# Patient Record
Sex: Male | Born: 1940 | Race: White
Health system: Southern US, Community
[De-identification: ages and names within clinical notes are randomized; demographics above are authoritative.]

## PROBLEM LIST (undated history)

## (undated) DIAGNOSIS — M797 Fibromyalgia: Secondary | ICD-10-CM

## (undated) DIAGNOSIS — K219 Gastro-esophageal reflux disease without esophagitis: Secondary | ICD-10-CM

## (undated) DIAGNOSIS — M799 Soft tissue disorder, unspecified: Secondary | ICD-10-CM

## (undated) DIAGNOSIS — M4800 Spinal stenosis, site unspecified: Secondary | ICD-10-CM

## (undated) DIAGNOSIS — R55 Syncope and collapse: Secondary | ICD-10-CM

## (undated) DIAGNOSIS — K921 Melena: Secondary | ICD-10-CM

## (undated) DIAGNOSIS — M4310 Spondylolisthesis, site unspecified: Secondary | ICD-10-CM

## (undated) DIAGNOSIS — I10 Essential (primary) hypertension: Secondary | ICD-10-CM

## (undated) HISTORY — PX: BACK SURGERY: SHX140

## (undated) HISTORY — DX: Fibromyalgia: M79.7

## (undated) HISTORY — DX: Spinal stenosis, site unspecified: M48.00

## (undated) HISTORY — DX: Gastro-esophageal reflux disease without esophagitis: K21.9

## (undated) HISTORY — DX: Essential (primary) hypertension: I10

## (undated) HISTORY — DX: Spondylolisthesis, site unspecified: M43.10

## (undated) HISTORY — PX: TONSILLECTOMY: SUR1361

## (undated) HISTORY — DX: Soft tissue disorder, unspecified: M79.9

## (undated) HISTORY — DX: Syncope and collapse: R55

## (undated) HISTORY — PX: VASECTOMY: SHX75

## (undated) HISTORY — DX: Melena: K92.1

---

## 1993-12-10 DIAGNOSIS — E785 Hyperlipidemia, unspecified: Secondary | ICD-10-CM

## 1993-12-10 HISTORY — DX: Hyperlipidemia, unspecified: E78.5

## 1997-12-10 DIAGNOSIS — N429 Disorder of prostate, unspecified: Secondary | ICD-10-CM

## 1997-12-10 HISTORY — DX: Disorder of prostate, unspecified: N42.9

## 1998-12-10 DIAGNOSIS — F419 Anxiety disorder, unspecified: Secondary | ICD-10-CM

## 1998-12-10 HISTORY — DX: Anxiety disorder, unspecified: F41.9

## 2005-12-10 DIAGNOSIS — Z8719 Personal history of other diseases of the digestive system: Secondary | ICD-10-CM

## 2005-12-10 DIAGNOSIS — N400 Enlarged prostate without lower urinary tract symptoms: Secondary | ICD-10-CM

## 2005-12-10 HISTORY — DX: Benign prostatic hyperplasia without lower urinary tract symptoms: N40.0

## 2005-12-10 HISTORY — DX: Personal history of other diseases of the digestive system: Z87.19

## 2006-04-25 DIAGNOSIS — K861 Other chronic pancreatitis: Secondary | ICD-10-CM | POA: Insufficient documentation

## 2006-05-23 DIAGNOSIS — I10 Essential (primary) hypertension: Secondary | ICD-10-CM | POA: Insufficient documentation

## 2006-06-25 ENCOUNTER — Other Ambulatory Visit: Payer: Self-pay

## 2006-07-02 ENCOUNTER — Ambulatory Visit: Payer: Self-pay | Admitting: Specialist

## 2012-12-10 DIAGNOSIS — K859 Acute pancreatitis without necrosis or infection, unspecified: Secondary | ICD-10-CM

## 2012-12-10 HISTORY — DX: Acute pancreatitis without necrosis or infection, unspecified: K85.90

## 2013-06-03 DIAGNOSIS — M545 Low back pain, unspecified: Secondary | ICD-10-CM | POA: Insufficient documentation

## 2013-06-03 DIAGNOSIS — M47817 Spondylosis without myelopathy or radiculopathy, lumbosacral region: Secondary | ICD-10-CM | POA: Insufficient documentation

## 2013-06-03 DIAGNOSIS — S32000A Wedge compression fracture of unspecified lumbar vertebra, initial encounter for closed fracture: Secondary | ICD-10-CM | POA: Insufficient documentation

## 2014-03-23 DIAGNOSIS — M533 Sacrococcygeal disorders, not elsewhere classified: Secondary | ICD-10-CM | POA: Insufficient documentation

## 2014-12-22 DIAGNOSIS — M25561 Pain in right knee: Secondary | ICD-10-CM | POA: Insufficient documentation

## 2015-01-13 DIAGNOSIS — M25551 Pain in right hip: Secondary | ICD-10-CM | POA: Insufficient documentation

## 2015-03-30 DIAGNOSIS — M47816 Spondylosis without myelopathy or radiculopathy, lumbar region: Secondary | ICD-10-CM | POA: Insufficient documentation

## 2015-08-25 DIAGNOSIS — M5416 Radiculopathy, lumbar region: Secondary | ICD-10-CM | POA: Insufficient documentation

## 2015-09-30 ENCOUNTER — Other Ambulatory Visit: Payer: Self-pay | Admitting: Orthopaedic Surgery

## 2015-10-17 ENCOUNTER — Other Ambulatory Visit: Payer: Self-pay | Admitting: Orthopaedic Surgery

## 2015-10-17 DIAGNOSIS — M48061 Spinal stenosis, lumbar region without neurogenic claudication: Secondary | ICD-10-CM

## 2015-10-17 DIAGNOSIS — M4316 Spondylolisthesis, lumbar region: Secondary | ICD-10-CM

## 2015-10-19 ENCOUNTER — Ambulatory Visit: Payer: Medicare Other

## 2015-10-19 NOTE — Pre-Procedure Instructions (Addendum)
   Pre-op Labs, EKG, CXR & Med Eval will be done 10/20/15 at 4 pm w/ PMD. Pt will ask PMD to fax all records to [facility].   Pt lives in [place] and will be flying to the area on 10/24/15 night to stay w/ his daughter prior to surgery.   Will come to [facility] 10/25/15 for T&S 2 units and MRSA/MSSA n/t swabs - will bring order from surgeon. Orders for T&S 2 units, MRSA/MSSA n/t & Vit D already in epic   Pt was evaluated for a 1x syncopal episode in approx 2012 - pt states EKG, Echo & Carotid Dopplers done - states all tests were "normal" - after hrs, office closed - fax requested for LOV notes, EKG and any testing from [facility].   Pt states pain is managed by surgeon.   Pt states he will bring his MRI disc on dos.   Educated pt about Comfort Function Goal - understanding verbalized.   Pt states he will watch the "Back in Action" video & take quiz - no questions at this time.

## 2015-10-24 NOTE — Pre-Procedure Instructions (Signed)
   Reviewed ancef/TED order

## 2015-10-24 NOTE — Discharge Instructions (Addendum)
Back in Action Spine Surgery booklet to be provided and reviewed -    Spine Wound Care information sheet to be provided and reviewed -    Constipation Information sheet to be provided and reviewed   -    Mixing alcohol and pain medications can cause dizziness and slow your breathing.    Don't drink alcoholic beverages while taking pain medications.           Edman Circle. Marjo Bicker, MD  Orthopaedic Spine Surgery    Lumbar Fusion Surgery  Discharge Instructions    WOUND CARE:    Your wound will be closed with absorbable stitches and covered with steri-strips, gauze and tape. Change the gauze dressing daily for seven days after surgery. If you notice any swelling, redness, or excessive drainage, call our office at [phone]. You may shower on the 5th day after surgery. Use caution to avoid letting the water pound on your incision. Do not soak your incision in the bath tub, hot tub, or pool until 4 weeks after surgery. You may purchase dressing supplies at [employer] located in the west corridor of the [employer].    ACTIVITY:    While in the hospital, you will be seen by occupational and physical therapists who will help you to sit in a chair and start walking and learn to climb stairs. After two to three days, you will be discharged to go home. You are encouraged to walk and change positions frequently. Some patients may need to have a physical therapist come to their home for a few visits. Some patients may need a walker and/or a bedside commode for a short time after arriving home. Do not bend, twist, or lift anything greater than a gallon of milk until re-evaluation. Do not use anti-inflammatory medications for two months after surgery. You may drive when you feel comfortable and are no longer using narcotic medications. Your doctor will discuss with you when you will be able to return to work.    FOLLOW UP:    You will be given a post-op appointment at two weeks after surgery at which time X-rays will be  taken. You will start physical therapy at 3 months after surgery. Should you have further questions or concerns at any point during your recovery, please do not hesitate to contact our office at [phone].    [address]  [address]  [place], [place]    [address]  Phone: [phone]  [phone]        Morphine Sulfate Oral tablet, extended-release    What is this medicine?  MORPHINE (MOR feen) is a pain reliever. It is used to treat moderate to severe pain that lasts for more than a few days.  This medicine may be used for other purposes; ask your health care provider or pharmacist if you have questions.  What should I tell my health care provider before I take this medicine?  They need to know if you have any of these conditions:   brain tumor   drug abuse or addiction   gallbladder disease   head injury   heart disease   if you frequently drink alcohol-containing drinks   intestinal disease   kidney disease or problems urinating   kyphoscoliosis   liver disease   lung disease, asthma, or breathing problems   pancreatic disease   seizures   stomach or intestine problems   taken isocarboxazid, phenelzine, tranylcypromine, or selegiline in the past 2 weeks   thyroid disease  an unusual or allergic reaction to lactose, morphine, other medicines, foods, dyes, or preservatives   pregnant or trying to get pregnant   breast-feeding  How should I use this medicine?  Take this medicine by mouth with a glass of water. Do not break, crush, or chew the medicine. Do not take a tablet that is not whole. A broken or crushed tablet can be very dangerous. You may get too much medicine. If the medicine upsets your stomach, take it with food or milk. Follow the directions on the prescription label. Take the medicine at the same time each day. Do not take more medicine than you are told to take.  A special MedGuide will be given to you by the pharmacist with each prescription and refill. Be sure to read  this information carefully each time.  Talk to your pediatrician regarding the use of this medicine in children. Special care may be needed.  Overdosage: If you think you have taken too much of this medicine contact a poison control center or emergency room at once.  NOTE: This medicine is only for you. Do not share this medicine with others.  What if I miss a dose?  If you miss a dose, take it as soon as you can. If it is almost time for your next dose, take only that dose. Do not take double or extra doses.  What may interact with this medicine?  Do not take this medicine with any of the following medications:   MAOIs like Carbex, Eldepryl, Marplan, Nardil, and Parnate  This medicine may also interact with the following medications:   alcohol   antihistamines   barbiturates, like phenobarbital   medicines for depression, anxiety, or psychotic disturbances   medicines for sleep   muscle relaxants   naltrexone, naloxone   narcotic medicines (opiates) for pain   rifampin   tramadol  This list may not describe all possible interactions. Give your health care provider a list of all the medicines, herbs, non-prescription drugs, or dietary supplements you use. Also tell them if you smoke, drink alcohol, or use illegal drugs. Some items may interact with your medicine.  What should I watch for while using this medicine?  Tell your doctor or health care professional if your pain does not go away, if it gets worse, or if you have new or a different type of pain. You may develop tolerance to the medicine. Tolerance means that you will need a higher dose of the medicine for pain relief. Tolerance is normal and is expected if you take this medicine for a long time.  Do not suddenly stop taking your medicine because you may develop a severe reaction. Your body becomes used to the medicine. This does NOT mean you are addicted. Addiction is a behavior related to getting and using a drug for a non-medical reason. If you  have pain, you have a medical reason to take pain medicine. Your doctor will tell you how much medicine to take. If your doctor wants you to stop the medicine, the dose will be slowly lowered over time to avoid any side effects.  You may get drowsy or dizzy when you first start taking the medicine or change doses. Do not drive, use machinery, or do anything that may be dangerous until you know how the medicine affects you. Stand or sit up slowly.  There are different types of narcotic medicines (opiates) for pain. If you take more than one type at the same time,  you may have more side effects. Give your health care provider a list of all medicines you use. Your doctor will tell you how much medicine to take. Do not take more medicine than directed. Call emergency for help if you have problems breathing.  This medicine will cause constipation. Try to have a bowel movement at least every 2 to 3 days. If you do not have a bowel movement for 3 days, call your doctor or health care professional.  Your mouth may get dry. Drinking water, chewing sugarless gum, or sucking on hard candy may help. See your dentist every 6 months.  What side effects may I notice from receiving this medicine?  Side effects that you should report to your doctor or health care professional as soon as possible:   allergic reactions like skin rash, itching or hives, swelling of the face, lips, or tongue   breathing problems   change in the amount of urine   confusion   fast, irregular heartbeat   feeling faint or lightheaded   fever, chills   hallucinations   seizures   slow or fast heartbeat   unusually weak or tired  Side effects that usually do not require medical attention (report to your doctor or health care professional if they continue or are bothersome):   constipation   dizziness   headache   nausea, vomiting   pinpoint pupils   sweating  This list may not describe all possible side effects. Call your doctor for medical  advice about side effects. You may report side effects to FDA at 1-800-FDA-1088.  Where should I keep my medicine?  Keep out of the reach of children. This medicine can be abused. Keep your medicine in a safe place to protect it from theft. Do not share this medicine with anyone. Selling or giving away this medicine is dangerous and is against the law.  Store at room temperature between 15 and 30 degrees C (59 and 86 degrees F). Protect from light.  This medicine may cause accidental overdose and death if it is taken by other adults, children, or pets. Flush any unused medicine down the toilet to reduce the chance of harm. Do not use the medicine after the expiration date.  NOTE: This sheet is a summary. It may not cover all possible information. If you have questions about this medicine, talk to your doctor, pharmacist, or health care provider.  NOTE:This sheet is a summary. It may not cover all possible information. If you have questions about this medicine, talk to your doctor, pharmacist, or health care provider. Copyright 2015 Gold Standard      Cyclobenzaprine Hydrochloride Oral tablet- Flexeril    What is this medicine?  CYCLOBENZAPRINE (sye kloe BEN za preen) is a muscle relaxer. It is used to treat muscle pain, spasms, and stiffness.  This medicine may be used for other purposes; ask your health care provider or pharmacist if you have questions.  What should I tell my health care provider before I take this medicine?  They need to know if you have any of these conditions:   heart disease, irregular heartbeat, or previous heart attack   liver disease   thyroid problem   an unusual or allergic reaction to cyclobenzaprine, tricyclic antidepressants, lactose, other medicines, foods, dyes, or preservatives   pregnant or trying to get pregnant   breast-feeding  How should I use this medicine?  Take this medicine by mouth with a glass of water. Follow the directions on  the prescription label. If this  medicine upsets your stomach, take it with food or milk. Take your medicine at regular intervals. Do not take it more often than directed.  Talk to your pediatrician regarding the use of this medicine in children. Special care may be needed.  Overdosage: If you think you have taken too much of this medicine contact a poison control center or emergency room at once.  NOTE: This medicine is only for you. Do not share this medicine with others.  What if I miss a dose?  If you miss a dose, take it as soon as you can. If it is almost time for your next dose, take only that dose. Do not take double or extra doses.  What may interact with this medicine?  Do not take this medicine with any of the following medications:   certain medicines for fungal infections like fluconazole, itraconazole, ketoconazole, posaconazole, voriconazole   cisapride   dofetilide   dronedarone   droperidol   flecainide   grepafloxacin   halofantrine   levomethadyl   MAOIs like Carbex, Eldepryl, Marplan, Nardil, and Parnate   nilotinib   pimozide   probucol   sertindole   thioridazine   ziprasidone  This medicine may also interact with the following medications:   abarelix   alcohol   certain medicines for cancer   certain medicines for depression, anxiety, or psychotic disturbances   certain medicines for infection like alfuzosin, chloroquine, clarithromycin, levofloxacin, mefloquine, pentamidine, troleandomycin   certain medicines for an irregular heart beat   certain medicines used for sleep or numbness during surgery or procedure   contrast dyes   dolasetron   guanethidine   methadone   octreotide   ondansetron   other medicines that prolong the QT interval (cause an abnormal heart rhythm)   palonosetron   phenothiazines like chlorpromazine, mesoridazine, prochlorperazine, thioridazine   tramadol   vardenafil  This list may not describe all possible interactions. Give your health care provider a list of all the  medicines, herbs, non-prescription drugs, or dietary supplements you use. Also tell them if you smoke, drink alcohol, or use illegal drugs. Some items may interact with your medicine.  What should I watch for while using this medicine?  Check with your doctor or health care professional if your condition does not improve within 1 to 3 weeks.  You may get drowsy or dizzy when you first start taking the medicine or change doses. Do not drive, use machinery, or do anything that may be dangerous until you know how the medicine affects you. Stand or sit up slowly.  Your mouth may get dry. Drinking water, chewing sugarless gum, or sucking on hard candy may help.  What side effects may I notice from receiving this medicine?  Side effects that you should report to your doctor or health care professional as soon as possible:   allergic reactions like skin rash, itching or hives, swelling of the face, lips, or tongue   chest pain   fast heartbeat   hallucinations   seizures   vomiting  Side effects that usually do not require medical attention (report to your doctor or health care professional if they continue or are bothersome):   headache  This list may not describe all possible side effects. Call your doctor for medical advice about side effects. You may report side effects to FDA at 1-800-FDA-1088.  Where should I keep my medicine?  Keep out of the reach of children.  Store at room temperature between 15 and 30 degrees C (59 and 86 degrees F). Keep container tightly closed. Throw away any unused medicine after the expiration date.  NOTE:This sheet is a summary. It may not cover all possible information. If you have questions about this medicine, talk to your doctor, pharmacist, or health care provider. Copyright 2015 Gold Standard      Pantoprazole Sodium Gastro-resistant tablet- Protonix     What is this medicine?  PANTOPRAZOLE (pan TOE pra zole) prevents the production of acid in the stomach. It is used to treat  gastroesophageal reflux disease (GERD), inflammation of the esophagus, and Zollinger-Ellison syndrome.  This medicine may be used for other purposes; ask your health care provider or pharmacist if you have questions.  What should I tell my health care provider before I take this medicine?  They need to know if you have any of these conditions:   liver disease   low levels of magnesium in the blood   an unusual or allergic reaction to omeprazole, lansoprazole, pantoprazole, rabeprazole, other medicines, foods, dyes, or preservatives   pregnant or trying to get pregnant   breast-feeding  How should I use this medicine?  Take this medicine by mouth. Swallow the tablets whole with a drink of water. Follow the directions on the prescription label. Do not crush, break, or chew. Take your medicine at regular intervals. Do not take your medicine more often than directed.  Talk to your pediatrician regarding the use of this medicine in children. While this drug may be prescribed for children as young as 5 years for selected conditions, precautions do apply.  Overdosage: If you think you have taken too much of this medicine contact a poison control center or emergency room at once.  NOTE: This medicine is only for you. Do not share this medicine with others.  What if I miss a dose?  If you miss a dose, take it as soon as you can. If it is almost time for your next dose, take only that dose. Do not take double or extra doses.  What may interact with this medicine?  Do not take this medicine with any of the following medications:   atazanavir   nelfinavir  This medicine may also interact with the following medications:   ampicillin   delavirdine   digoxin   diuretics   iron salts   medicines for fungal infections like ketoconazole, itraconazole and voriconazole   warfarin  This list may not describe all possible interactions. Give your health care provider a list of all the medicines, herbs, non-prescription  drugs, or dietary supplements you use. Also tell them if you smoke, drink alcohol, or use illegal drugs. Some items may interact with your medicine.  What should I watch for while using this medicine?  It can take several days before your stomach pain gets better. Check with your doctor or health care professional if your condition does not start to get better, or if it gets worse.  You may need blood work done while you are taking this medicine.  What side effects may I notice from receiving this medicine?  Side effects that you should report to your doctor or health care professional as soon as possible:   allergic reactions like skin rash, itching or hives, swelling of the face, lips, or tongue   bone, muscle or joint pain   breathing problems   chest pain or chest tightness   dark yellow or brown urine  dizziness   fast, irregular heartbeat   feeling faint or lightheaded   fever or sore throat   muscle spasm   palpitations   redness, blistering, peeling or loosening of the skin, including inside the mouth   seizures   tremors   unusual bleeding or bruising   unusually weak or tired   yellowing of the eyes or skin  Side effects that usually do not require medical attention (Report these to your doctor or health care professional if they continue or are bothersome.):   constipation   diarrhea   dry mouth   headache   nausea  This list may not describe all possible side effects. Call your doctor for medical advice about side effects. You may report side effects to FDA at 1-800-FDA-1088.  Where should I keep my medicine?  Keep out of the reach of children.  Store at room temperature between 15 and 30 degrees C (59 and 86 degrees F). Protect from light and moisture. Throw away any unused medicine after the expiration date.  NOTE:This sheet is a summary. It may not cover all possible information. If you have questions about this medicine, talk to your doctor, pharmacist, or health care provider.  Copyright 2015 Gold Standard        Dextromethorphan Hydriodide, Guaifenesin Oral tablet, extended-release    What is this medicine?  DEXTROMETHORPHAN; GUAIFENESIN (dex troe meth OR fan; gwye FEN e sin) is a cough suppressant, expectorant combination. It is used to provide relief from cough. This medicine will not treat an infection.  This medicine may be used for other purposes; ask your health care provider or pharmacist if you have questions.  What should I tell my health care provider before I take this medicine?  They need to know if you have any of these conditions:   asthma   chronic bronchitis   emphysema   if you have taken an MAOI like Carbex, Eldepryl, Marplan, Nardil, or Parnate in last 14 days   kidney or liver disease   unable to sit up   an unusual or allergic reaction to dextromethorphan, guaifenesin, other medicines, foods, dyes, bromides, or preservatives   pregnant or trying to get pregnant   breast-feeding  How should I use this medicine?  Take this medicine by mouth with a full glass of water. Follow the directions on the prescription label. Do not crush or chew. Take your medicine at regular intervals. Do not take your medicine more often than directed.  Talk to your pediatrician regarding the use of this medicine in children. While this drug may be prescribed for children as young as 13 years of age for selected conditions, precautions do apply.  Overdosage: If you think you have taken too much of this medicine contact a poison control center or emergency room at once.  NOTE: This medicine is only for you. Do not share this medicine with others.  What if I miss a dose?  If you miss a dose, take it as soon as you can. If it is almost time for your next dose, take only that dose. Do not take double or extra doses.  What may interact with this medicine?  Do not take this medicine with any of the following medications:   MAOIs like Carbex, Eldepryl, Marplan, Nardil, and  Parnate   procarbazine  This medicine may also interact with the following medications:   medicines for depression or other mental disturbances   other medicines for colds or allergy  This list may not describe all possible interactions. Give your health care provider a list of all the medicines, herbs, non-prescription drugs, or dietary supplements you use. Also tell them if you smoke, drink alcohol, or use illegal drugs. Some items may interact with your medicine.  What should I watch for while using this medicine?  Tell your doctor if your symptoms do not improve within 5 days or if they get worse. If you have a high fever, skin rash, lasting headache, or sore throat, see your doctor.  Drink 6 to 8 glasses of water daily while you are taking this medicine to help loosen mucus.  You may get drowsy or dizzy. Do not drive, use machinery, or do anything that needs mental alertness until you know how this medicine affects you. Do not stand or sit up quickly, especially if you are an older patient. This reduces the risk of dizzy or fainting spells. Alcohol may interfere with the effect of this medicine. Avoid alcoholic drinks.  What side effects may I notice from receiving this medicine?  Side effects that you should report to your doctor or health care professional as soon as possible:   allergic reactions like skin rash, itching or hives, swelling of the face, lips, or tongue   confusion   excitement, nervousness, restlessness, or irritability   slow or troubled breathing  Side effects that usually do not require medical attention (report to your doctor or health care professional if they continue or are bothersome):   headache   stomach upset  This list may not describe all possible side effects. Call your doctor for medical advice about side effects. You may report side effects to FDA at 1-800-FDA-1088.  Where should I keep my medicine?  Keep out of the reach of children.  Store at room temperature between  15 and 30 degrees C (59 and 86 degrees F). Protect from light and moisture. Throw away any unused medicine after the expiration date.  NOTE:This sheet is a summary. It may not cover all possible information. If you have questions about this medicine, talk to your doctor, pharmacist, or health care provider. Copyright 2015 Gold Standard

## 2015-10-24 NOTE — Plan of Care (Signed)
Problem: Health Promotion  Goal: Risk control - tobacco abuse  Actions to eliminate or reduce tobacco use.  Outcome: Completed Date Met:  10/24/15  Never smoked

## 2015-10-24 NOTE — Pre-Procedure Instructions (Signed)
   Call to pcp, spoke with Neysa Bonito, she will fax lov note, ekg, labs, to [facility] [phone].

## 2015-10-25 ENCOUNTER — Ambulatory Visit: Payer: Medicare Other | Attending: Orthopaedic Surgery

## 2015-10-25 DIAGNOSIS — M48061 Spinal stenosis, lumbar region without neurogenic claudication: Secondary | ICD-10-CM

## 2015-10-25 DIAGNOSIS — Z01812 Encounter for preprocedural laboratory examination: Secondary | ICD-10-CM | POA: Insufficient documentation

## 2015-10-25 DIAGNOSIS — Z0183 Encounter for blood typing: Secondary | ICD-10-CM | POA: Insufficient documentation

## 2015-10-25 DIAGNOSIS — M4316 Spondylolisthesis, lumbar region: Secondary | ICD-10-CM | POA: Insufficient documentation

## 2015-10-25 DIAGNOSIS — M4806 Spinal stenosis, lumbar region: Secondary | ICD-10-CM | POA: Insufficient documentation

## 2015-10-25 LAB — HEMOLYSIS INDEX: Hemolysis Index: 16 (ref 0–18)

## 2015-10-25 LAB — TYPE AND SCREEN
AB Screen Gel: NEGATIVE
ABO Rh: O POS

## 2015-10-25 LAB — VITAMIN D,25 OH,TOTAL: Vitamin D, 25 OH, Total: 16 ng/mL — ABNORMAL LOW (ref 30–100)

## 2015-10-26 NOTE — Pre-Procedure Instructions (Signed)
   Called PMD's office to request for EKG  be faxed from their office - for better copy.

## 2015-10-26 NOTE — Pre-Procedure Instructions (Signed)
T&X on chart - MRSA/MSSA pending

## 2015-10-27 NOTE — Pre-Procedure Instructions (Signed)
Reviewed negative MRSA/MSSA

## 2015-11-10 ENCOUNTER — Inpatient Hospital Stay
Admission: RE | Admit: 2015-11-10 | Discharge: 2015-11-18 | DRG: 460 | Disposition: A | Discharge status: Rehabilitation Facility | Payer: Medicare Other | Source: Ambulatory Visit | Attending: Hospitalist | Admitting: Hospitalist

## 2015-11-10 ENCOUNTER — Inpatient Hospital Stay: Payer: Medicare Other | Admitting: Orthopaedic Surgery

## 2015-11-10 ENCOUNTER — Inpatient Hospital Stay: Payer: Medicare Other

## 2015-11-10 ENCOUNTER — Encounter
Admission: RE | Disposition: A | Discharge status: Rehabilitation Facility | Payer: Self-pay | Source: Ambulatory Visit | Attending: Orthopaedic Surgery

## 2015-11-10 DIAGNOSIS — M48061 Spinal stenosis, lumbar region without neurogenic claudication: Secondary | ICD-10-CM | POA: Insufficient documentation

## 2015-11-10 DIAGNOSIS — M4316 Spondylolisthesis, lumbar region: Secondary | ICD-10-CM | POA: Diagnosis present

## 2015-11-10 DIAGNOSIS — N401 Enlarged prostate with lower urinary tract symptoms: Secondary | ICD-10-CM

## 2015-11-10 DIAGNOSIS — K567 Ileus, unspecified: Secondary | ICD-10-CM | POA: Diagnosis not present

## 2015-11-10 DIAGNOSIS — M5136 Other intervertebral disc degeneration, lumbar region: Secondary | ICD-10-CM | POA: Diagnosis present

## 2015-11-10 DIAGNOSIS — K219 Gastro-esophageal reflux disease without esophagitis: Secondary | ICD-10-CM | POA: Diagnosis present

## 2015-11-10 DIAGNOSIS — M797 Fibromyalgia: Secondary | ICD-10-CM | POA: Diagnosis present

## 2015-11-10 DIAGNOSIS — M25561 Pain in right knee: Secondary | ICD-10-CM | POA: Diagnosis not present

## 2015-11-10 DIAGNOSIS — R079 Chest pain, unspecified: Secondary | ICD-10-CM | POA: Diagnosis not present

## 2015-11-10 DIAGNOSIS — R410 Disorientation, unspecified: Secondary | ICD-10-CM | POA: Diagnosis not present

## 2015-11-10 DIAGNOSIS — M4806 Spinal stenosis, lumbar region: Principal | ICD-10-CM | POA: Diagnosis present

## 2015-11-10 DIAGNOSIS — M5137 Other intervertebral disc degeneration, lumbosacral region: Secondary | ICD-10-CM | POA: Diagnosis present

## 2015-11-10 DIAGNOSIS — I10 Essential (primary) hypertension: Secondary | ICD-10-CM | POA: Diagnosis present

## 2015-11-10 DIAGNOSIS — Z981 Arthrodesis status: Secondary | ICD-10-CM

## 2015-11-10 DIAGNOSIS — D72829 Elevated white blood cell count, unspecified: Secondary | ICD-10-CM | POA: Diagnosis not present

## 2015-11-10 DIAGNOSIS — R509 Fever, unspecified: Secondary | ICD-10-CM | POA: Diagnosis not present

## 2015-11-10 DIAGNOSIS — R319 Hematuria, unspecified: Secondary | ICD-10-CM | POA: Diagnosis not present

## 2015-11-10 DIAGNOSIS — K9189 Other postprocedural complications and disorders of digestive system: Secondary | ICD-10-CM | POA: Diagnosis not present

## 2015-11-10 DIAGNOSIS — N400 Enlarged prostate without lower urinary tract symptoms: Secondary | ICD-10-CM | POA: Diagnosis present

## 2015-11-10 DIAGNOSIS — M542 Cervicalgia: Secondary | ICD-10-CM | POA: Diagnosis present

## 2015-11-10 HISTORY — DX: Spinal stenosis, lumbar region without neurogenic claudication: M48.061

## 2015-11-10 LAB — TYPE AND SCREEN
AB Screen Gel: NEGATIVE
ABO Rh: O POS

## 2015-11-10 LAB — ECG 12-LEAD
Atrial Rate: 85 {beats}/min
P Axis: 48 degrees
P-R Interval: 160 ms
Q-T Interval: 358 ms
QRS Duration: 88 ms
QTC Calculation (Bezet): 426 ms
R Axis: -19 degrees
T Axis: -9 degrees
Ventricular Rate: 85 {beats}/min

## 2015-11-10 SURGERY — LAMINECTOMY, POSTERIOR LUMBAR, DECOMP, FUSION, LEVEL 2
Anesthesia: Anesthesia General | Site: Back | Wound class: Clean

## 2015-11-10 MED ORDER — OXYCODONE-ACETAMINOPHEN 5-325 MG PO TABS
2.0000 | ORAL_TABLET | ORAL | Status: DC | PRN
Start: 2015-11-10 — End: 2015-11-11
  Administered 2015-11-11: 2 via ORAL
  Filled 2015-11-10 (×2): qty 2

## 2015-11-10 MED ORDER — CEFAZOLIN SODIUM 1 G IJ SOLR
1.0000 g | INTRAMUSCULAR | Status: AC
Start: 2015-11-10 — End: 2015-11-10
  Administered 2015-11-10: 1 g via INTRAVENOUS

## 2015-11-10 MED ORDER — FAMOTIDINE 10 MG/ML IV SOLN (WRAP)
INTRAVENOUS | Status: DC | PRN
Start: 2015-11-10 — End: 2015-11-10
  Administered 2015-11-10: 20 mg via INTRAVENOUS

## 2015-11-10 MED ORDER — CEFAZOLIN 1 GM MBP (CNR)
Status: AC
Start: 2015-11-10 — End: ?
  Filled 2015-11-10: qty 100

## 2015-11-10 MED ORDER — FAMOTIDINE 20 MG/2ML IV SOLN
INTRAVENOUS | Status: AC
Start: 2015-11-10 — End: ?
  Filled 2015-11-10: qty 2

## 2015-11-10 MED ORDER — ROCURONIUM BROMIDE 50 MG/5ML IV SOLN
INTRAVENOUS | Status: DC | PRN
Start: 2015-11-10 — End: 2015-11-10
  Administered 2015-11-10: 50 mg via INTRAVENOUS
  Administered 2015-11-10: 10 mg via INTRAVENOUS

## 2015-11-10 MED ORDER — PREGABALIN 75 MG PO CAPS
75.0000 mg | ORAL_CAPSULE | Freq: Once | ORAL | Status: AC
Start: 2015-11-10 — End: 2015-11-10
  Administered 2015-11-10: 75 mg via ORAL

## 2015-11-10 MED ORDER — SODIUM CHLORIDE BACTERIOSTATIC 0.9 % IJ SOLN
INTRAMUSCULAR | Status: DC | PRN
Start: 2015-11-10 — End: 2015-11-10
  Administered 2015-11-10: 10 mL

## 2015-11-10 MED ORDER — CELECOXIB 200 MG PO CAPS
200.0000 mg | ORAL_CAPSULE | Freq: Once | ORAL | Status: AC
Start: 2015-11-10 — End: 2015-11-10
  Administered 2015-11-10: 200 mg via ORAL

## 2015-11-10 MED ORDER — MICROFIBRILLAR COLL HEMOSTAT EX POWD
CUTANEOUS | Status: DC | PRN
Start: 2015-11-10 — End: 2015-11-10
  Administered 2015-11-10: 1 g via TOPICAL

## 2015-11-10 MED ORDER — OXYCODONE-ACETAMINOPHEN 5-325 MG PO TABS
1.0000 | ORAL_TABLET | ORAL | Status: DC | PRN
Start: 2015-11-10 — End: 2015-11-11

## 2015-11-10 MED ORDER — OXYCODONE HCL ER 10 MG PO T12A
10.0000 mg | EXTENDED_RELEASE_TABLET | Freq: Once | ORAL | Status: AC
Start: 2015-11-10 — End: 2015-11-10
  Administered 2015-11-10: 10 mg via ORAL

## 2015-11-10 MED ORDER — AMLODIPINE BESYLATE 5 MG PO TABS
ORAL_TABLET | Freq: Every day | ORAL | Status: DC
Start: 2015-11-11 — End: 2015-11-18
  Filled 2015-11-10 (×15): qty 1

## 2015-11-10 MED ORDER — CEFAZOLIN SODIUM 1 G IJ SOLR
1.0000 g | Freq: Three times a day (TID) | INTRAMUSCULAR | Status: AC
Start: 2015-11-10 — End: 2015-11-11
  Administered 2015-11-10 – 2015-11-11 (×2): 1 g via INTRAVENOUS
  Filled 2015-11-10 (×2): qty 1000

## 2015-11-10 MED ORDER — HYDROMORPHONE HCL 1 MG/ML IJ SOLN
INTRAMUSCULAR | Status: AC
Start: 2015-11-10 — End: ?
  Filled 2015-11-10: qty 1

## 2015-11-10 MED ORDER — HYDROMORPHONE HCL 1 MG/ML IJ SOLN
0.5000 mg | INTRAMUSCULAR | Status: AC | PRN
Start: 2015-11-10 — End: 2015-11-10
  Administered 2015-11-10 (×2): 0.5 mg via INTRAVENOUS

## 2015-11-10 MED ORDER — LACTATED RINGERS IV SOLN
INTRAVENOUS | Status: DC
Start: 2015-11-10 — End: 2015-11-14

## 2015-11-10 MED ORDER — HEPARIN SODIUM (PORCINE) 1000 UNIT/ML IJ SOLN
INTRAMUSCULAR | Status: DC | PRN
Start: 2015-11-10 — End: 2015-11-10
  Administered 2015-11-10: 6000 [IU]

## 2015-11-10 MED ORDER — PROMETHAZINE HCL 25 MG PO TABS
25.0000 mg | ORAL_TABLET | Freq: Four times a day (QID) | ORAL | Status: DC | PRN
Start: 2015-11-10 — End: 2015-11-18
  Administered 2015-11-12: 25 mg via ORAL
  Filled 2015-11-10: qty 1

## 2015-11-10 MED ORDER — PROPOFOL 10 MG/ML IV EMUL (WRAP)
INTRAVENOUS | Status: AC
Start: 2015-11-10 — End: ?
  Filled 2015-11-10: qty 20

## 2015-11-10 MED ORDER — BENZOCAINE-MENTHOL 15-3.6 MG MT LOZG
1.0000 | LOZENGE | OROMUCOSAL | Status: DC | PRN
Start: 2015-11-10 — End: 2015-11-18

## 2015-11-10 MED ORDER — FENTANYL CITRATE (PF) 50 MCG/ML IJ SOLN (WRAP)
INTRAMUSCULAR | Status: DC | PRN
Start: 2015-11-10 — End: 2015-11-10
  Administered 2015-11-10: 100 ug via INTRAVENOUS
  Administered 2015-11-10: 50 ug via INTRAVENOUS

## 2015-11-10 MED ORDER — PROMETHAZINE HCL 25 MG/ML IJ SOLN
6.5000 mg | Freq: Four times a day (QID) | INTRAMUSCULAR | Status: DC | PRN
Start: 2015-11-10 — End: 2015-11-18
  Administered 2015-11-12: 6.5 mg via INTRAVENOUS
  Filled 2015-11-10: qty 1

## 2015-11-10 MED ORDER — OXYCODONE HCL ER 10 MG PO T12A
EXTENDED_RELEASE_TABLET | ORAL | Status: AC
Start: 2015-11-10 — End: ?
  Filled 2015-11-10: qty 1

## 2015-11-10 MED ORDER — EPHEDRINE SULFATE 50 MG/ML IJ SOLN
INTRAMUSCULAR | Status: AC
Start: 2015-11-10 — End: ?
  Filled 2015-11-10: qty 1

## 2015-11-10 MED ORDER — ACETAMINOPHEN 500 MG PO TABS
1000.0000 mg | ORAL_TABLET | Freq: Once | ORAL | Status: DC | PRN
Start: 2015-11-10 — End: 2015-11-10

## 2015-11-10 MED ORDER — HYDROMORPHONE HCL 1 MG/ML IJ SOLN
INTRAMUSCULAR | Status: AC
Start: 2015-11-10 — End: 2015-11-10
  Administered 2015-11-10: 0.5 mg via INTRAVENOUS
  Filled 2015-11-10: qty 1

## 2015-11-10 MED ORDER — HYDROMORPHONE HCL 2 MG PO TABS
2.0000 mg | ORAL_TABLET | Freq: Once | ORAL | Status: AC | PRN
Start: 2015-11-10 — End: 2015-11-10

## 2015-11-10 MED ORDER — MEPERIDINE HCL 25 MG/ML IJ SOLN
12.5000 mg | Freq: Once | INTRAMUSCULAR | Status: DC
Start: 2015-11-10 — End: 2015-11-10

## 2015-11-10 MED ORDER — PHENYLEPHRINE HCL 10 MG/ML IV SOLN (WRAP)
100.0000 mg | Status: DC | PRN
Start: 2015-11-10 — End: 2015-11-10
  Administered 2015-11-10: 30 ug/min via INTRAVENOUS

## 2015-11-10 MED ORDER — ACETAMINOPHEN 500 MG PO TABS
1000.0000 mg | ORAL_TABLET | Freq: Once | ORAL | Status: AC
Start: 2015-11-10 — End: 2015-11-10
  Administered 2015-11-10: 1000 mg via ORAL

## 2015-11-10 MED ORDER — ACETAMINOPHEN 500 MG PO TABS
ORAL_TABLET | ORAL | Status: AC
Start: 2015-11-10 — End: ?
  Filled 2015-11-10: qty 2

## 2015-11-10 MED ORDER — PHENYLEPHRINE 100 MCG/ML IN NACL 0.9% IV SOSY
PREFILLED_SYRINGE | INTRAVENOUS | Status: AC
Start: 2015-11-10 — End: ?
  Filled 2015-11-10: qty 5

## 2015-11-10 MED ORDER — LIDOCAINE HCL 2 % IJ SOLN
INTRAMUSCULAR | Status: DC | PRN
Start: 2015-11-10 — End: 2015-11-10
  Administered 2015-11-10: 100 mg via INTRAVENOUS

## 2015-11-10 MED ORDER — LIDOCAINE HCL (PF) 2 % IJ SOLN
INTRAMUSCULAR | Status: AC
Start: 2015-11-10 — End: ?
  Filled 2015-11-10: qty 5

## 2015-11-10 MED ORDER — EPHEDRINE SULFATE 50 MG/ML IJ SOLN
INTRAMUSCULAR | Status: DC | PRN
Start: 2015-11-10 — End: 2015-11-10
  Administered 2015-11-10 (×2): 5 mg via INTRAVENOUS

## 2015-11-10 MED ORDER — BISACODYL 10 MG RE SUPP
10.0000 mg | Freq: Every day | RECTAL | Status: DC | PRN
Start: 2015-11-10 — End: 2015-11-18
  Administered 2015-11-15: 10 mg via RECTAL
  Filled 2015-11-10: qty 1

## 2015-11-10 MED ORDER — HYDROMORPHONE HCL 1 MG/ML IJ SOLN
INTRAMUSCULAR | Status: DC | PRN
Start: 2015-11-10 — End: 2015-11-10
  Administered 2015-11-10: .3 mg via INTRAVENOUS
  Administered 2015-11-10: .2 mg via INTRAVENOUS

## 2015-11-10 MED ORDER — HYDROMORPHONE HCL 1 MG/ML IJ SOLN
1.0000 mg | INTRAMUSCULAR | Status: DC | PRN
Start: 2015-11-10 — End: 2015-11-17
  Administered 2015-11-10 – 2015-11-16 (×10): 1 mg via INTRAVENOUS
  Filled 2015-11-10 (×10): qty 1

## 2015-11-10 MED ORDER — SODIUM CHLORIDE 0.9 % IR SOLN
Status: DC | PRN
Start: 2015-11-10 — End: 2015-11-10
  Administered 2015-11-10 (×2): 1000 mL
  Administered 2015-11-10: 250 mL
  Administered 2015-11-10: 1000 mL

## 2015-11-10 MED ORDER — FENTANYL CITRATE (PF) 50 MCG/ML IJ SOLN (WRAP)
25.0000 ug | INTRAMUSCULAR | Status: AC | PRN
Start: 2015-11-10 — End: 2015-11-10
  Administered 2015-11-10 (×3): 25 ug via INTRAVENOUS

## 2015-11-10 MED ORDER — PREGABALIN 75 MG PO CAPS
ORAL_CAPSULE | ORAL | Status: AC
Start: 2015-11-10 — End: ?
  Filled 2015-11-10: qty 1

## 2015-11-10 MED ORDER — PRAMIPEXOLE DIHYDROCHLORIDE 0.5 MG PO TABS
0.2500 mg | ORAL_TABLET | Freq: Every evening | ORAL | Status: DC
Start: 2015-11-10 — End: 2015-11-18
  Administered 2015-11-10 – 2015-11-17 (×8): 0.25 mg via ORAL
  Filled 2015-11-10 (×9): qty 1

## 2015-11-10 MED ORDER — PHENYLEPHRINE 100 MCG/ML IN NACL 0.9% IV SOSY
PREFILLED_SYRINGE | INTRAVENOUS | Status: DC | PRN
Start: 2015-11-10 — End: 2015-11-10
  Administered 2015-11-10: 200 ug via INTRAVENOUS
  Administered 2015-11-10 (×8): 100 ug via INTRAVENOUS

## 2015-11-10 MED ORDER — ATORVASTATIN CALCIUM 10 MG PO TABS
10.0000 mg | ORAL_TABLET | Freq: Every evening | ORAL | Status: DC
Start: 2015-11-10 — End: 2015-11-18
  Administered 2015-11-10 – 2015-11-17 (×7): 10 mg via ORAL
  Filled 2015-11-10 (×8): qty 1

## 2015-11-10 MED ORDER — ONDANSETRON HCL 4 MG/2ML IJ SOLN
4.0000 mg | Freq: Once | INTRAMUSCULAR | Status: DC | PRN
Start: 2015-11-10 — End: 2015-11-10

## 2015-11-10 MED ORDER — PROMETHAZINE HCL 25 MG/ML IJ SOLN
6.2500 mg | Freq: Once | INTRAMUSCULAR | Status: DC | PRN
Start: 2015-11-10 — End: 2015-11-10

## 2015-11-10 MED ORDER — ROCURONIUM BROMIDE 50 MG/5ML IV SOLN
INTRAVENOUS | Status: AC
Start: 2015-11-10 — End: ?
  Filled 2015-11-10: qty 5

## 2015-11-10 MED ORDER — PROPOFOL 10 MG/ML IV EMUL (WRAP)
INTRAVENOUS | Status: DC | PRN
Start: 2015-11-10 — End: 2015-11-10
  Administered 2015-11-10: 50 mg via INTRAVENOUS
  Administered 2015-11-10: 150 mg via INTRAVENOUS

## 2015-11-10 MED ORDER — FLEET ENEMA 7-19 GM/118ML RE ENEM
1.0000 | ENEMA | Freq: Once | RECTAL | Status: DC | PRN
Start: 2015-11-10 — End: 2015-11-18

## 2015-11-10 MED ORDER — DEXAMETHASONE SODIUM PHOSPHATE 4 MG/ML IJ SOLN (WRAP)
INTRAMUSCULAR | Status: DC | PRN
Start: 2015-11-10 — End: 2015-11-10
  Administered 2015-11-10: 10 mg via INTRAVENOUS

## 2015-11-10 MED ORDER — GLYCOPYRROLATE 0.2 MG/ML IJ SOLN
INTRAMUSCULAR | Status: DC | PRN
Start: 2015-11-10 — End: 2015-11-10
  Administered 2015-11-10: .2 mg via INTRAVENOUS
  Administered 2015-11-10: .4 mg via INTRAVENOUS

## 2015-11-10 MED ORDER — FENTANYL CITRATE (PF) 50 MCG/ML IJ SOLN (WRAP)
INTRAMUSCULAR | Status: AC
Start: 2015-11-10 — End: ?
  Filled 2015-11-10: qty 4

## 2015-11-10 MED ORDER — SODIUM CHLORIDE 0.9 % IV SOLN
INTRAVENOUS | Status: DC
Start: 2015-11-10 — End: 2015-11-14

## 2015-11-10 MED ORDER — EPINEPHRINE HCL 1 MG/ML IJ SOLN (WRAP)
Status: DC | PRN
Start: 2015-11-10 — End: 2015-11-10
  Administered 2015-11-10: 17:00:00 1 mg

## 2015-11-10 MED ORDER — THROMBIN 5000 UNITS EX SOLR
CUTANEOUS | Status: DC | PRN
Start: 2015-11-10 — End: 2015-11-10
  Administered 2015-11-10: 5000 [IU] via TOPICAL
  Administered 2015-11-10: 10000 [IU] via TOPICAL

## 2015-11-10 MED ORDER — DIPHENHYDRAMINE HCL 50 MG/ML IJ SOLN
12.5000 mg | INTRAMUSCULAR | Status: DC | PRN
Start: 2015-11-10 — End: 2015-11-10

## 2015-11-10 MED ORDER — CELECOXIB 200 MG PO CAPS
ORAL_CAPSULE | ORAL | Status: AC
Start: 2015-11-10 — End: ?
  Filled 2015-11-10: qty 1

## 2015-11-10 MED ORDER — FENTANYL CITRATE (PF) 50 MCG/ML IJ SOLN (WRAP)
INTRAMUSCULAR | Status: AC
Start: 2015-11-10 — End: 2015-11-10
  Administered 2015-11-10: 25 ug via INTRAVENOUS
  Filled 2015-11-10: qty 2

## 2015-11-10 MED ORDER — HYDROMORPHONE HCL 2 MG PO TABS
ORAL_TABLET | ORAL | Status: AC
Start: 2015-11-10 — End: 2015-11-10
  Administered 2015-11-10: 2 mg via ORAL
  Filled 2015-11-10: qty 1

## 2015-11-10 MED ORDER — DIPHENHYDRAMINE HCL 25 MG PO CAPS
25.0000 mg | ORAL_CAPSULE | Freq: Four times a day (QID) | ORAL | Status: DC | PRN
Start: 2015-11-10 — End: 2015-11-18
  Administered 2015-11-11: 25 mg via ORAL
  Filled 2015-11-10 (×2): qty 1

## 2015-11-10 MED ORDER — GELATIN ABSORBABLE 100 EX MISC
CUTANEOUS | Status: DC | PRN
Start: 2015-11-10 — End: 2015-11-10
  Administered 2015-11-10: 1 via TOPICAL

## 2015-11-10 MED ORDER — OXYCODONE-ACETAMINOPHEN 5-325 MG PO TABS
1.0000 | ORAL_TABLET | Freq: Once | ORAL | Status: DC | PRN
Start: 2015-11-10 — End: 2015-11-11

## 2015-11-10 MED ORDER — BACITRACIN 50000 UNITS IM SOLR
INTRAMUSCULAR | Status: DC | PRN
Start: 2015-11-10 — End: 2015-11-10
  Administered 2015-11-10 (×2): 50000 [IU]

## 2015-11-10 MED ORDER — DEXAMETHASONE SODIUM PHOSPHATE 20 MG/5ML IJ SOLN
INTRAMUSCULAR | Status: AC
Start: 2015-11-10 — End: ?
  Filled 2015-11-10: qty 5

## 2015-11-10 MED ORDER — NEOSTIGMINE METHYLSULFATE 1 MG/ML IJ SOLN
INTRAMUSCULAR | Status: DC | PRN
Start: 2015-11-10 — End: 2015-11-10
  Administered 2015-11-10: 2 mg via INTRAVENOUS
  Administered 2015-11-10: 1 mg via INTRAVENOUS

## 2015-11-10 MED ORDER — CYCLOBENZAPRINE HCL 10 MG PO TABS
5.0000 mg | ORAL_TABLET | Freq: Three times a day (TID) | ORAL | Status: DC | PRN
Start: 2015-11-10 — End: 2015-11-18
  Administered 2015-11-12 – 2015-11-17 (×8): 5 mg via ORAL
  Filled 2015-11-10 (×8): qty 1

## 2015-11-10 MED ORDER — ONDANSETRON HCL 4 MG/2ML IJ SOLN
INTRAMUSCULAR | Status: DC | PRN
Start: 2015-11-10 — End: 2015-11-10
  Administered 2015-11-10: 4 mg via INTRAVENOUS

## 2015-11-10 MED ORDER — SENNOSIDES-DOCUSATE SODIUM 8.6-50 MG PO TABS
1.0000 | ORAL_TABLET | Freq: Two times a day (BID) | ORAL | Status: DC
Start: 2015-11-10 — End: 2015-11-18
  Administered 2015-11-10 – 2015-11-18 (×14): 1 via ORAL
  Filled 2015-11-10 (×12): qty 1

## 2015-11-10 MED ORDER — ONDANSETRON 4 MG PO TBDP
4.0000 mg | ORAL_TABLET | Freq: Three times a day (TID) | ORAL | Status: DC | PRN
Start: 2015-11-10 — End: 2015-11-18
  Administered 2015-11-11 – 2015-11-15 (×8): 4 mg via ORAL
  Filled 2015-11-10 (×8): qty 1

## 2015-11-10 MED ORDER — ONDANSETRON HCL 4 MG/2ML IJ SOLN
4.0000 mg | Freq: Three times a day (TID) | INTRAMUSCULAR | Status: DC | PRN
Start: 2015-11-10 — End: 2015-11-18

## 2015-11-10 MED ORDER — PROMETHAZINE HCL 25 MG RE SUPP
25.0000 mg | Freq: Four times a day (QID) | RECTAL | Status: DC | PRN
Start: 2015-11-10 — End: 2015-11-18

## 2015-11-10 MED ORDER — MAGNESIUM HYDROXIDE 400 MG/5ML PO SUSP
10.0000 mL | ORAL | Status: DC | PRN
Start: 2015-11-10 — End: 2015-11-18

## 2015-11-10 SURGICAL SUPPLY — 101 items
ANGIO CATH CODM (Procedure Accessories) ×2 IMPLANT
BAG DECANTER STERILE (Procedure Accessories) ×2 IMPLANT
BAG FOLEY DRAIN 16FR (Catheter Micellaneous) ×1
BAG FOLEY DRAIN 16FR (Catheter Miscellaneous) ×1 IMPLANT
BANDAGE ADH PLSTR POLYACRYLATE CVRL 2YD (Bandage) ×1
BANDAGE ADHESIVE L2 YD X W6 IN STRETCH (Bandage) ×1
BANDAGE ADHESIVE L2 YD X W6 IN STRETCH NONWOVEN POROUS COVER-ROLL (Bandage) ×1 IMPLANT
BONE MASTERGRAFT VIAL 5CC (Bone) ×2 IMPLANT
CATH IV 14GX1.75IN NLTX (IV Supply) ×2 IMPLANT
CLOSURE STERI-STRIP 1X5IN (Dressing) ×2 IMPLANT
COTTONOID 1/2X1" 80-1402" (Dressing) IMPLANT
COVER HNDL LIGHT SINGLES (Procedure Accessories) ×4 IMPLANT
COVER HVYDTY BLK 65X90IN (Drape) ×2 IMPLANT
COVER STAND W23 IN REINFORCE PLASTIC (Drape) ×1 IMPLANT
COVER STND PLS MAYO CNVRT 23IN LF STRL (Drape) ×2
CTTND 1/2X1/2" (Sponge) IMPLANT
DRAIN INCS SIL FULL FLUT FLT 3/8IN LF (Drain) ×1
DRAIN RADIOPAQUE 4 FREE FLOW CHANNEL (Drain) ×1
DRAIN RADIOPAQUE 4 FREE FLOW CHANNEL BARD L3/8 IN INCISION SILICONE (Drain) ×1 IMPLANT
DRAPE 3/4 SHEET FANFLD 52X76IN (Drape) ×2 IMPLANT
DRAPE LAPAROTOMY (Drape) ×2 IMPLANT
DRAPE MAG FM DVN 20X16IN LF STRL HNDFR (Drape) ×1
DRAPE MAGNETIC HAND FREE TRANSFER (Drape) ×1 IMPLANT
EVACUATOR RELIAVAC 100M (Drain) ×2 IMPLANT
FORCEPS ELECTROSURGICAL L7 3/4 IN 1.5 MM BAYONET BIPOLAR INSULATE CORD (Disposable Instruments) ×1 IMPLANT
FORCEPS ESURG SS 1.5MM BYNT LBRTY S-G (Disposable Instruments) ×2
GAUZE COVER ROLL STRETCH 6 (Dressing) ×2 IMPLANT
GLOVE SRG DERMASHIELD NPRN 7 GAMMEX LF (Glove) ×2
GLOVE SRG NTR RBR 8 INDCTR BGL 299X103MM (Glove) ×2
GLOVE SRGCL 7 GMMX CHEMOTHERAPY PWDR FREE ANTISLIP SMTH MICRO NPRN GRN (Glove) ×2 IMPLANT
GLOVE SURGICAL 7 GAMMEX CHEMOTHERAPY (Glove) ×2
GLOVE SURGICAL 8 INDICATOR BIOGEL POWDER (Glove) ×2
GLOVE SURGICAL 8 INDICATOR BIOGEL POWDER FREE SMOOTH BEAD CUFF (Glove) ×2 IMPLANT
GOWN OPTIMA STRL BACK OR (Gown) ×2 IMPLANT
GRAFT BN HA TCP CLGN 10ML 100X25X4MM (Allograft) ×2 IMPLANT
GRAFT BONE 100X25X4MM HA TCP COLLAGEN 10ML ALLOGRAFT STRIP GRANULE (Allograft) ×1 IMPLANT
GRAFT BONE RHBMP-2 BOVINE COLLAGEN L23 (Bone) ×1 IMPLANT
GRAFT BONE RHBMP-2 BOVINE COLLAGEN L23 MM 2.8 ML ABSORBABLE SPONGE (Bone) ×1 IMPLANT
INFUSE BN GRAFT SMALL KIT (Bone) ×1 IMPLANT
KIT MAROMAX CONVENIENCE DISP (Kits) ×2 IMPLANT
MARKER SKIN 2IN1 W/T-O SLEEVE (Procedure Accessories) ×2 IMPLANT
MASTISOL VIAL 2/3CC STRL (Skin Closure) ×2 IMPLANT
NEEDLE NOKOR FILTER 19GX1.5IN (Needles) ×2 IMPLANT
NEEDLE SPINAL BD OD22 GA L3 1/2 IN (Needles)
NEEDLE SPINAL L3 1/2 IN REGULAR WALL QUINCKE TIP OD22 GA BD (Needles) IMPLANT
NEEDLE SPNL PP RW BD QNCK 22GA 3.5IN LF (Needles)
PACK SPNE FFX (Pack) ×2 IMPLANT
PAD ABD PVC CRTY 9X5IN LF STRL 3 LYR (Dressing) ×2 IMPLANT
PAD ARMBOARD 20X8X2IN (Procedure Accessories) ×6 IMPLANT
PAD ELECTROSRG GRND REM W CRD (Procedure Accessories) ×2 IMPLANT
PATTIES 1X3 USE LAWSON 2442 (Sponge) ×2 IMPLANT
POSITIONER HEAD SLOTTED ADLT (Positioning Supplies) ×2 IMPLANT
ROD SPINAL DENALI L65 MM OD5.5 MM (Rods) ×2 IMPLANT
ROD SPINAL DENALI L65 MM OD5.5 MM TITANIUM CONTOUR GRAY (Rods) ×2 IMPLANT
ROD SPNL TI CNTR DENALI 5.5MM 65MM NS (Rods) ×2 IMPLANT
SCREW BN VRST 6.5MM 45MM NS PA SPNE (Screw) ×4 IMPLANT
SCREW BN VRST 7.5MM 40MM NS PA SPNE (Screw) ×2 IMPLANT
SCREW L40 MM OD7.5 MM SPINE POLYAXIAL (Screw) ×2 IMPLANT
SCREW L40 MM OD7.5 MM SPINE POLYAXIAL BONE EVEREST (Screw) ×2 IMPLANT
SCREW L45 MM OD6.5 MM SPINE POLYAXIAL (Screw) ×4 IMPLANT
SCREW L45 MM OD6.5 MM SPINE POLYAXIAL BONE EVEREST (Screw) ×4 IMPLANT
SCREW SET VRST NS SPNE (Screw) ×6 IMPLANT
SET SCREW LUMBAR ONE LEVEL EVEREST (Screw) ×6 IMPLANT
SLEEVE TED SCD LARGE (Procedure Accessories) ×2 IMPLANT
SOLUTION SRGPRP 74% ISPRP 0.7% IOD (Prep) ×1
SOLUTION SURGICAL PREP 26 ML DURAPREP (Prep) ×1
SOLUTION SURGICAL PREP 26 ML DURAPREP 74% ISOPROPYL ALCOHOL 0.7% (Prep) ×1 IMPLANT
SPONGE GAUZE 12PLY STRL 4X4IN (Sponge) ×2 IMPLANT
SPONGE GAUZE L4 IN X W4 IN 16 PLY (Dressing) ×1
SPONGE GAUZE L4 IN X W4 IN 16 PLY MAXIMUM ABSORBENT USP TYPE VII (Dressing) ×1 IMPLANT
SPONGE GZE CTTN CRTY 4X4IN LF NS 16 PLY (Dressing) ×1
SPONGE LAP 18X18IN PREWASH WHT (Sponge)
SPONGE LAPAROTOMY L18 IN X W18 IN (Sponge)
SPONGE LAPAROTOMY L18 IN X W18 IN PREWASH WHITE (Sponge) IMPLANT
SPONGE PEANUT C5 HOLDER DISSECTOR XRAY (Sponge)
SPONGE PEANUT C5 HOLDER DISSECTOR XRAY DETECTABLE OD3/8 IN DUKAL (Sponge) IMPLANT
SPONGE PEANUT RADPQE STRL3/8IN (Sponge)
SPONGE SRG VISTEC 8X4IN LF STRL 12 PLY (Sponge)
SPONGE SURGICAL L8 IN X W4 IN 12 PLY (Sponge)
SPONGE SURGICAL L8 IN X W4 IN 12 PLY RADIOPAQUE BAND VISTEC BLUE WHITE (Sponge) IMPLANT
STRIP SKIN CLOSURE L4 IN X W1/4 IN (Dressing) ×1
STRIP SKIN CLOSURE L4 IN X W1/4 IN REINFORCE STERI-STRIP POLYESTER (Dressing) ×1 IMPLANT
STRIP SKNCLS PLSTR STRSTRP 4X.25IN LF (Dressing) ×1
SUBSTITUTE BONE GRAFT CALCIUM PHOSPHATE COLLAGEN 5 CC VIAL VOID FILLER (Bone) ×1 IMPLANT
SUTURE ABS 1 CT1 VCL 18IN CR BRD 8 STRN (Suture) ×2
SUTURE ABS 2-0 CT1 VCL 18IN CR BRD 8 (Suture) ×2
SUTURE ABS 3-0 PS1 VCL MTPS 27IN BRD (Suture) ×1
SUTURE COATED VICRYL 1 CT-1 L18 IN (Suture) ×2 IMPLANT
SUTURE COATED VICRYL 2-0 CT-1 18IN CNTRL BRD 8 STRND UNDYED (Suture) ×2 IMPLANT
SUTURE COATED VICRYL 2-0 CT-1 L18 IN (Suture) ×2
SUTURE COATED VICRYL 3-0 PS-1 L27 IN (Suture) ×1 IMPLANT
SYRINGE LUER LOCK 10CC (Syringes, Needles) ×8 IMPLANT
SYSTEM IRR MTL VTVU YNKR 12FR LF STRL (Suction) ×2
SYSTEM IRRIGATION EXTEND SUCTION ILLUMINATION TUBE BULB TIP VITAL-VUE (Suction) ×1 IMPLANT
TOOL DISSECTING L14 CM MATCH HEAD FLUTE (Burr) ×1
TOOL DISSECTING L14 CM MATCH HEAD FLUTE OD3 MM MIDAS REX LEGEND (Burr) ×1 IMPLANT
TOOL DSCT LGND 3MM 14MM (Burr) ×1
TOWEL L26 IN X W17 IN COTTON PREWASH DELINT BLUE ACTISORB DELUXE (Procedure Accessories) IMPLANT
TOWEL SRG CTTN 26X17IN LF STRL PREWASH (Procedure Accessories)
TOWEL STERILE REUSABLE 8PK (Procedure Accessories) ×2 IMPLANT
TUBING SUCTION 3/16X10 (Tubing) ×4 IMPLANT

## 2015-11-10 NOTE — Consults (Signed)
[facility] INTERNAL MEDICINE CONSULT    Date Time: 11/10/2015 9:27 PM  Patient Name: Matthew [name]  Attending Physician: Clelia Schaumann, MD  Primary Care Physician: Bobby Rumpf, MD    CC: chest pain before lumbar laminectomy and fusion      History of Presenting Illness:   Matthew [name] is a 74 y.o. male hx chronic pancreatitis, BPH, HTN who had chest pain before lumbar laminectomy and fusion 12/1 by Dr. Marjo Bicker. He had an EKG during the episode that showed no ST elevations or LBBB. He proceeded with surgery. He states the pain was sharp, left lower chest / left upper quadrant, and lasted ~2 minutes. He denies chest pain, shortness of breath, but notes acid reflux. These symptoms are sudden onset, moderate intensity, without alleviating factors.     He states having been on Dilaudid 4mg  PO q4h prn pain before the surgery.    Past Medical History:     Past Medical History   Diagnosis Date   . Hypertension      controlled   . Syncopal episodes approx 2012     1x episode - had negative cardiac work-up - no episodes sinice   . BPH (benign prostatic hypertrophy) 2007     w/ hx of obstruction - s/p suprapubic prostatectomy   . History of pancreatitis 2007     had microscopic stones - ERCP w/ stent done   . Spinal stenosis    . Spondylolisthesis    . Fibromyalgia      in remission   . Gastroesophageal reflux disease    . 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 Ercp, stent  2007   . Suprapubic prostatectomy  2007   . Cholecystectomy  1980   . Vasectomy  1980's   . Cervical fusion  1997     states has full ROM of neck   . Appendectomy  1952   . Tonsillectomy  as child       Family History:   History reviewed. No pertinent family history.  His mother and father had cancer.     Social History:     Social History     Social History   . Marital Status: Divorced     Spouse  Name: N/A   . Number of Children: N/A   . Years of Education: N/A     Social History Main Topics   . Smoking status: Never Smoker    . Smokeless tobacco: Never Used   . Alcohol Use: Yes      Comment: usually drinks 1 glass wine per night - none for past few months while on pain meds   . Drug Use: No   . Sexual Activity: Not on file     Other Topics Concern   . Not on file     Social History Narrative       Allergies:   No Known Allergies    Medications:     Prescriptions prior to admission   Medication Sig   . ALPRAZolam (XANAX) 0.5 MG tablet Take 0.5 mg by mouth 2 (two) times daily as needed.      Marland Kitchen amLODIPine-benazepril (LOTREL 5-10) 5-10 MG per capsule Take 1 capsule by mouth nightly.   Marland Kitchen atorvastatin (LIPITOR) 10 MG tablet  Take 10 mg by mouth nightly.      . BELSOMRA 20 MG Tab Take 20 mg by mouth nightly.      . docusate sodium (COLACE) 100 MG capsule TAKE ONE CAPSULE BY MOUTH EVERY 12 HOURS   . HYDROmorphone (DILAUDID) 4 MG tablet TAKE ONE TABLET BY MOUTH EVERY 4 HOURS AS NEEDED FOR PAIN   . ibuprofen (ADVIL,MOTRIN) 600 MG tablet TAKE ONE TABLET BY MOUTH THREE TIMES A DAY WITH FOOD AS NEEDED   . Multiple Vitamin (MULTIVITAMIN) tablet Take 1 tablet by mouth daily.   . pramipexole (MIRAPEX) 0.25 MG tablet Take 0.25 mg by mouth every 12 (twelve) hours.       Current Facility-Administered Medications   Medication Dose Route Frequency   . meperidine  12.5 mg Intravenous Once       Review of Systems:   All other systems were reviewed and are negative except: as above.    Physical Exam:     Filed Vitals:    11/10/15 2110   BP: 102/65   Pulse: 68   Temp:    Resp: 15   SpO2: 100%       Intake and Output Summary (Last 24 hours) at Date Time    Intake/Output Summary (Last 24 hours) at 11/10/15 2127  Last data filed at 11/10/15 2100   Gross per 24 hour   Intake   2700 ml   Output    260 ml   Net   2440 ml       General: awake, alert, oriented x 3; mild distress.  HEENT: perrla, eomi, sclera anicteric  oropharynx clear  without lesions, mucous membranes moist  Neck: supple, no lymphadenopathy, no thyromegaly, no JVD, no carotid bruits  Cardiovascular: regular rate and rhythm, no murmurs, rubs or gallops  Lungs: clear to auscultation bilaterally, without wheezing, rhonchi, or rales  Abdomen: soft, non-tender, non-distended; no palpable masses, no hepatosplenomegaly, normoactive bowel sounds, no rebound or guarding  Extremities: no clubbing, cyanosis, or edema  Neuro: cranial nerves grossly intact, strength 5/5 in upper and lower extremities, sensation intact, follows commands 100% of the time  Skin: no rashes or lesions noted      Labs:     Results     Procedure Component Value Units Date/Time    Type and screen [[id number]] Collected:  11/10/15 1405    Specimen Information:  Blood Updated:  11/10/15 1504     ABO Rh O POS      AB Screen Gel NEG           Radiology Results (24 Hour)     Procedure Component Value Units Date/Time    Fluoroscopy greater than 1 hour [[id number]] Collected:  11/10/15 1919    Order Status:  Completed Updated:  11/10/15 1924    Narrative:      History: L4-S1 laminectomy. Lower lumbar degeneration.    Fluoroscopy: Fluoroscopic guidance was provided for this operative  procedure, performed without the presence of a radiologist. Levels were  localized for L4-S1 posterior fusion.  Fluoroscopy time: Eleven seconds  Number of fluoroscopic images obtained: Five      Impression:       Lumbosacral fusion under fluoroscopic guidance.          Nelta Numbers, MD   11/10/2015 7:20 PM              Assessment:     Patient Active Problem List   Diagnosis   . Degenerative lumbar spinal stenosis  74 yo male hx chronic pancreatitis, BPH, HTN who had chest pain before lumbar laminectomy and fusion 12/1 by Dr. Marjo Bicker.    Recommendations:   Chest pain - Repeat cardiac enzymes, telemetry, oxygen, analgesia.     Lumbar laminectomy and fusion 12/1 by Dr. Marjo Bicker - Noted.     Hx chronic pancreatitis, BPH, HTN - Home meds.     Thank  you for the consultation. We will follow the patient with you.    Signed by: Melton Krebs, MD   cc:Pcp, Danae Chen, MD

## 2015-11-10 NOTE — Plan of Care (Signed)
Problem: Health Promotion  Goal: Vaccination Screening  All patients will be screened for current vaccination status on each admission.   Outcome: Completed Date Met:  11/10/15  Pt received flu vaccine prior to admission

## 2015-11-10 NOTE — Anesthesia Preprocedure Evaluation (Signed)
Anesthesia Evaluation    AIRWAY    Mallampati: II    TM distance: >3 FB  Neck ROM: full  Mouth Opening:full   CARDIOVASCULAR    cardiovascular exam normal       DENTAL    no notable dental hx     PULMONARY    pulmonary exam normal     OTHER FINDINGS                      Anesthesia Plan    ASA 2     general                     intravenous induction   Detailed anesthesia plan: general endotracheal        Post op pain management: per surgeon    informed consent obtained    Plan discussed with CRNA.    ECG reviewed  pertinent labs reviewed

## 2015-11-10 NOTE — Transfer of Care (Signed)
Anesthesia Transfer of Care Note    Patient: Matthew [name]    Procedures performed: Procedure(s) with comments:  LAMINECTOMY, POSTERIOR LUMBAR, DECOMP, FUSION, LEVEL 2 - L4-S1 LAMINECTOMY/FUSION    Anesthesia type: General ETT    Patient location:Gyn PACU    Last vitals:   Filed Vitals:    11/10/15 1931   BP: 120/69   Pulse: 95   Temp:    Resp: 11   SpO2: 98%       Post pain: Patient not complaining of pain, continue current therapy      Mental Status:drowsy but arousable    Respiratory Function: tolerating face mask    Cardiovascular: stable    Nausea/Vomiting: patient not complaining of nausea or vomiting    Hydration Status: adequate    Post assessment: no apparent anesthetic complications, no reportable events and no evidence of recall

## 2015-11-10 NOTE — H&P (Signed)
History and physical reviewed and patient re-examined today.  No changes noted.  Pain level ranges from 6-10/10.

## 2015-11-11 ENCOUNTER — Inpatient Hospital Stay: Payer: Medicare Other

## 2015-11-11 DIAGNOSIS — M48061 Spinal stenosis, lumbar region without neurogenic claudication: Secondary | ICD-10-CM | POA: Insufficient documentation

## 2015-11-11 DIAGNOSIS — N400 Enlarged prostate without lower urinary tract symptoms: Secondary | ICD-10-CM | POA: Insufficient documentation

## 2015-11-11 DIAGNOSIS — M4316 Spondylolisthesis, lumbar region: Secondary | ICD-10-CM | POA: Insufficient documentation

## 2015-11-11 DIAGNOSIS — N401 Enlarged prostate with lower urinary tract symptoms: Secondary | ICD-10-CM

## 2015-11-11 DIAGNOSIS — I10 Essential (primary) hypertension: Secondary | ICD-10-CM | POA: Diagnosis present

## 2015-11-11 LAB — BASIC METABOLIC PANEL
BUN: 17 mg/dL (ref 9.0–28.0)
CO2: 21 mEq/L (ref 21–30)
Calcium: 8.8 mg/dL (ref 7.9–10.2)
Chloride: 108 mEq/L (ref 100–111)
Creatinine: 1.2 mg/dL (ref 0.5–1.5)
Glucose: 223 mg/dL — ABNORMAL HIGH (ref 70–100)
Potassium: 4.9 mEq/L (ref 3.5–5.3)
Sodium: 137 mEq/L (ref 135–146)

## 2015-11-11 LAB — ECG 12-LEAD
Atrial Rate: 77 {beats}/min
P Axis: 51 degrees
P-R Interval: 170 ms
Q-T Interval: 368 ms
QRS Duration: 84 ms
QTC Calculation (Bezet): 416 ms
R Axis: -5 degrees
T Axis: 9 degrees
Ventricular Rate: 77 {beats}/min

## 2015-11-11 LAB — TROPONIN I
Troponin I: <0.01 ng/mL (ref 0.00–0.09)
Troponin I: 0.01 ng/mL (ref 0.00–0.09)

## 2015-11-11 LAB — CBC
Absolute NRBC: 0 10*3/uL
Hematocrit: 41.9 % — ABNORMAL LOW (ref 42.0–52.0)
Hgb: 13.8 g/dL (ref 13.0–17.0)
MCH: 31.3 pg (ref 28.0–32.0)
MCHC: 32.9 g/dL (ref 32.0–36.0)
MCV: 95 fL (ref 80.0–100.0)
MPV: 9.2 fL — ABNORMAL LOW (ref 9.4–12.3)
Nucleated RBC: 0 /100 WBC (ref 0–1)
Platelets: 284 10*3/uL (ref 140–400)
RBC: 4.41 10*6/uL — ABNORMAL LOW (ref 4.70–6.00)
RDW: 12 % (ref 12–15)
WBC: 7.98 10*3/uL (ref 3.50–10.80)

## 2015-11-11 LAB — HEMOLYSIS INDEX: Hemolysis Index: 20 — ABNORMAL HIGH (ref 0–18)

## 2015-11-11 LAB — GFR: EGFR: 59

## 2015-11-11 MED ORDER — HYDROMORPHONE HCL 2 MG PO TABS
2.0000 mg | ORAL_TABLET | ORAL | Status: DC | PRN
Start: 2015-11-11 — End: 2015-11-18
  Administered 2015-11-11 – 2015-11-18 (×24): 2 mg via ORAL
  Filled 2015-11-11 (×24): qty 1

## 2015-11-11 MED ORDER — MORPHINE SULFATE ER 15 MG PO TBCR
15.0000 mg | EXTENDED_RELEASE_TABLET | Freq: Three times a day (TID) | ORAL | Status: DC
Start: 2015-11-11 — End: 2015-11-18
  Administered 2015-11-11 – 2015-11-16 (×17): 15 mg via ORAL
  Filled 2015-11-11 (×17): qty 1

## 2015-11-11 NOTE — Plan of Care (Signed)
Problem: Physical Therapy  Goal: Patient condition is improving per Physical Therapy Treatment Plan  Outcome: Completed Date Met:  11/11/15

## 2015-11-11 NOTE — UM Notes (Signed)
Admit 11/10/15 for planned lumbar lami and fusion    Care Date #`1  11/10/15  Admit inpt status  Date and time of inpt order: 11/10/15 1324  OR for lumbar lami and fusion  PT/OT, home meds, zofran po/iv, labs, IVF, ambulate, neurovasc checks, perocet, dilaudid IV, flexeril, scd, phenergan po/sup/iv, cefazolin IV x 2, dilaudid po, foley,   Pain 5/10 dilaudid IV, po meds    Leafy Half RN BSN  Utilization Review Case Manager  [phone]

## 2015-11-11 NOTE — Progress Notes (Addendum)
DISCHARGE PLANNING    Anticipated Dispo:     When medically stable, [facility].    [facility] liaison met with pt and daughter. [facility] accepted referral. Will need OT eval prior to d/c. I spoke with PT/OT scheduling line and they said they would assign someone for a likely d/c tomorrow.     [facility] weekend pager number for d/c planner to request bed assignment: [phone].     Transportation:     [facility].     Kelton Pillar, MSW, [facility] Discharge Planner  [facility]  [address]  [employer] [phone]

## 2015-11-11 NOTE — Progress Notes (Signed)
[facility] Inpatient Rehab Note:  Referral received from [facility], CM.  Following patient for acute inpatient rehab at [facility]   Met with patient and his daughter to discuss acute rehab.  Pt and daughter want [facility] for acute rehab.  Per daughter, MD would like to transition patient tomorrow as long as he remains medically stable.  Willing to accept patient pending medical clearance to admit tomorrow,  Saturday 11/12/15 to [address], room 534-1, report to [phone].  Covering CM, please page [facility] acute rehab liaison via pager [phone] to confirm transfer.     Kelton Pillar, CM notified of decision.  Scotty Court, BSN, RN-BC  [phone]  [facility]-Re[facility] Admissions Liaison  [phone]

## 2015-11-11 NOTE — Progress Notes (Signed)
Pt pleasant and cooperative but restless at times. Noted to be forgetful at times. Worried about getting out of bed when needing to have a BM. Assisted to chair with walker. Tolerated ambulation well. Vital signs within normal limits. Lungs clear. Respiration easy and regular. Using incentive spirometer with encouragement. Foley catheter intact draining clear yellow urine. JP at wound site draining serosanguinous fluid. Appetite fair. Pt nauseated this afternoon but pt stated he still didn't have much of an appetite. Daughter visiting throughout afternoon.

## 2015-11-11 NOTE — Progress Notes (Addendum)
MEDICINE CONSULT FOLLOW-UP    Date Time: 11/11/2015 2:11 PM  Patient Name: Matthew [name]  Attending Physician: Clelia Schaumann, MD  Consulting Physician: Isaias Cowman, MD      Assessment:     Patient Active Problem List    Diagnosis Date Noted   . Lumbar stenosis 11/11/2015   . 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 Hypertension 11/11/2015   . BPH (benign prostatic hypertrophy) 11/11/2015   . 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 Chest Pain- Etiology unclear however resolved without intervention after 2 minutes and has not recurred   - EKG without acute ischemic changes, troponin negative X 1, will trend to rule out ACS  - Does note history of acid reflux which may have contributed to chest pain v atypical in etiology   - CXR does not show infiltrate or other acute pathology, radiology read pending    Thank you for the consult.  We will continue to follow the patient.  Please call with any questions or concerns.      CC: follow-up: Chest Pain     HPI/Subjective: No acute events overnight.  No recurrence of chest pain after resolution.  Pain was sharp and located over left lower chest without radiation or other associated symptoms.  Resolved after 2 minutes without intervention.  Denies shortness of breath, abdominal pain, nausea, vomiting, or reflux at this time.  States he is doing well now.  Plan of care discussed with family at bedside.      Review of Systems:   Review of Systems - Negative except as noted above in HPI        Physical Exam:   Patient Vitals for the past 24 hrs:   BP Temp Temp src Pulse Resp SpO2   11/11/15 1059 115/55 mmHg 97.2 F (36.2 C) Oral (!) 103 16 94 %   11/11/15 0653 119/57 mmHg (!) 96.4 F (35.8 C) Oral 92 16 99 %   11/11/15 0430 120/58 mmHg 97 F (36.1 C) - 82 17 96 %   11/10/15 2350 105/55 mmHg (!) 96.5 F (35.8 C) - 75 16 94 %   11/10/15 2200 96/63 mmHg (!) 96.3 F (35.7 C) - 85 16 97 %   11/10/15 2150 - - - 80 14 99 %    11/10/15 2140 101/65 mmHg - - 80 15 96 %   11/10/15 2130 97/60 mmHg - - 86 15 95 %   11/10/15 2120 107/75 mmHg 97 F (36.1 C) Temporal Art 84 16 99 %   11/10/15 2110 102/65 mmHg - - 68 15 100 %   11/10/15 2100 121/72 mmHg - - 88 15 96 %   11/10/15 2050 116/69 mmHg - - 77 16 97 %   11/10/15 2040 106/58 mmHg - - 86 15 97 %   11/10/15 2030 99/56 mmHg - - 70 13 96 %   11/10/15 2020 92/50 mmHg (!) 96.9 F (36.1 C) Temporal Art 68 12 100 %   11/10/15 2010 103/56 mmHg - - 75 13 100 %   11/10/15 2000 106/59 mmHg - - 92 16 98 %   11/10/15 1950 109/58 mmHg - - 87 14 100 %   11/10/15 1940 106/58 mmHg - - 88 15 100 %   11/10/15 1931 120/69 mmHg - - 95 (!) 11 98 %   11/10/15 1930 123/63 mmHg - - 94 16 97 %   11/10/15 1925 123/63 mmHg  97.2 F (36.2 C) Temporal Art 97 16 97 %     Body mass index is 22.68 kg/(m^2).    Intake/Output Summary (Last 24 hours) at 11/11/15 1411  Last data filed at 11/11/15 1355   Gross per 24 hour   Intake   3540 ml   Output   2270 ml   Net   1270 ml       General: awake, alert, oriented x 3; no acute distress.  Cardiovascular: regular rate and rhythm, no murmurs, rubs or gallops, chest pain not reproducible with palpation   Lungs: clear to auscultation bilaterally, without wheezing, rhonchi, or rales  Abdomen: soft, non-tender, non-distended; no palpable masses, no hepatosplenomegaly, normoactive bowel sounds, no rebound or guarding  Extremities: no clubbing, cyanosis, or edema  Neuro: CN grossly intact, speech fluent, follows commands, 5/5 b/l UE and LE strength     Meds:     Medications were reviewed:    Labs:     Recent Labs      11/11/15   0402   WBC  7.98   HGB  13.8   HEMATOCRIT  41.9*   PLATELETS  284       Recent Labs      11/11/15   0402   SODIUM  137   POTASSIUM  4.9   CHLORIDE  108   CO2  21   BUN  17.0   CREATININE  1.2   GLUCOSE  223*   CALCIUM  8.8       No results for input(s): AST, ALT, ALKPHOS, PROT, ALB in the last 72 hours.    No results for input(s): PTT, PT, INR in the last 72  hours.    Imaging personally reviewed, including: Fluoroscopy Greater Than 1 Hour    11/10/2015   Lumbosacral fusion under fluoroscopic guidance. Nelta Numbers, MD 11/10/2015 7:20 PM               Case discussed with: Patient, RN, Family         Signed by: Isaias Cowman, MD

## 2015-11-11 NOTE — H&P (Signed)
IRF Post-Admission Assessment  History and Physical    Reason for admission: Dysfunction of mobility and ADL's secondary to lumbar stenosis s/p decompression and fusion.    Date of admission: 11-11-2015  History of present illness:   This 74 y.o. year old RHD  male was admitted to [facility] on 11-10-15 with lumbar stenosis and BLE neurogenic claudication.  He underwent L4-S1 laminectomy and fusion.  Post op course has been complicated by ileus and vomiting.  He also had severe right knee pain and aseptic inflammation.      Functional history:   Pre-morbid status Admission status   Bed mobility Independent Max A   Transfers Independent Maximal assist   Locomotion Independent nt   Upper body dressing Independent Minimal assist   Lower body dressing Independent Maximal assist   Bathing Independent nt   Toileting Independent nt   Communication Not impaired Not impaired   Swallow Not impaired Not impaired   Cognition Not impaired Not impaired       Past Medical History:   Past Medical History   Diagnosis Date   . Hypertension      controlled   . Syncopal episodes approx 2012     1x episode - had negative cardiac work-up - no episodes sinice   . BPH (benign prostatic hypertrophy) 2007     w/ hx of obstruction - s/p suprapubic prostatectomy   . History of pancreatitis 2007     had microscopic stones - ERCP w/ stent done   . Spinal stenosis    . Spondylolisthesis    . Fibromyalgia      in remission   . Gastroesophageal reflux disease    . 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    Physical Examination:   There were no vitals taken  for this visit.  Estimated body mass index is 22.68 kg/(m^2) as calculated from the following:    Height as of 10/19/15: 1.778 m (5\' 10" ).    Weight as of 11/10/15: 71.7 kg (158 lb 1.1 oz).    General appearance: Normal body habitus.  Eyes:Anicteric sclerae. Conjunctivae non-injected. EOMI.  HENT:Hearing grossly intact. Moist mucous membranes.  CV:+S1S2 Heart rate and rhythm are regular. No significant lower limb edema.  Pulm:Lungs are clear to auscultation bilaterally. No wheezes, rales, or rhonchi. Good respiratory effort.  DGU:YQIH. Non-tender. Normoactive bowel sounds.  Skin:No visible rashes or breakdown. Incision is c/d/i.  Neuro: Speech fluent and appropriate.        Manual muscle strength testing:  Muscle group Left Right   Shoulder abductors 5 5   Elbow flexors 5 5   Elbow extensors 5 5   Finger flexors 5 5   Hip flexors 3 - 3 -   Knee flexors 4 - 4   Knee extensors 4 - 4   Ankle dorsiflexors 4 4   Ankle plantarflexors 5 5   EHL 4 4     Psych:Alert Pleasant and cooperative Oriented x 3 Normal mood and affect  Labs:   Lab Results   Component Value Date    WBC 7.98 11/11/2015    HGB 13.8 11/11/2015    HCT 41.9* 11/11/2015    MCV 95.0 11/11/2015    PLT 284 11/11/2015     Lab Results   Component Value Date    BUN 17.0 11/11/2015     Lab Results   Component Value Date    CREAT 1.2 11/11/2015     Lab Results   Component Value Date    NA 137 11/11/2015    K 4.9 11/11/2015    CL 108 11/11/2015    CO2 21 11/11/2015     No results found for: ALT, AST, GGT, ALKPHOS, BILITOTAL  No results found for: PROT, ALB  No results found for: INR, PT    Imaging reports reviewed:  Fluoroscopy Greater Than 1 Hour    11/10/2015   Lumbosacral fusion under fluoroscopic guidance. Nelta Numbers, MD 11/10/2015 7:20 PM     Xr Chest Ap Portable    11/11/2015   No acute disease. Phillips Odor, MD 11/11/2015 3:02 PM       Assessment:   1. Lumbar stenosis with neurogenic claudication  2. S/P L4-S1 decomp and fusion         . Hypertension       controlled   . Syncopal episodes approx 2012     1x episode - had negative cardiac work-up - no episodes sinice   . BPH (benign prostatic hypertrophy) 2007     w/ hx of obstruction - s/p suprapubic prostatectomy   . History of pancreatitis 2007     had microscopic stones - ERCP w/ stent done   . Spinal stenosis    . Spondylolisthesis    . Fibromyalgia      in remission   . Gastroesophageal reflux disease    . 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    [x]  The following co-morbidities may complicate rehabilitation:  Anemia and Wounds    [x]  Is at risk for the following complications:  Injurious falls, Urinary tract infection, Venous thromboembolic disease, Fecal impaction, Pressure sores and Wound infection        Individualized Plan of Care:    - REHAB: Begin comprehensive and intensive inpatient rehab program, including:  Physical therapy 60-120 min daily, 5-6 times per week  Occupational therapy  60-120 min daily, 5-6 times per week  Therapeutic recreation  Psychology  Dietician consultation  Case management  Rehabilitation nursing    Will work in an interdisciplinary manner to address the following impairments and issues:   Mobility, ADLs, Impaired strength, Impaired ROM, Impaired endurance, Adherence to precautions, Caregiver training, Wounds, Medication management, Adjustment to disability, Leisure skills, Community support and resources, Impaired balance and Coping strategies    Requires 24h rehabilitation nursing to address:  Bladder care, Bowel care, Medication teaching, Nutrition, Positioning, Safety, Wounds, Cardiopulmonary reassessments and Neuro checks    Anticipate a discharge to:  Home without assistance    Estimated length of stay: 2 wks      Goals for discharge:  Bed mobility Independent   Transfers  Independent   Locomotion Independent   Upper body dressing Independent   Lower body dressing Independent   Bathing Independent   Toileting Independent  Communication Use compensatory strategies to express needs and wants     Swallow Tolerate least restrictive oral diet without signs or symptoms of aspiration     Cognition Use compensatory strategies appropriately to compensate       Rehab potential: excellent    Prognosis: excellent        Review of Pre-admission assessment  [x]  I have reviewed the nurse liaison's pre-admission assessment in [employer].   I do not note any significant changes at this time and agree with patient's appropriateness for intensive inpatient rehab program.      ----------------------------------------------------------------------------------------------------------------------  INPATIENT REHABILITATION FACILITY - PATIENT ASSESSMENT INSTRUMENT QUALITY INDICATORS       Section C.  Cognitive Patterns.    C0100. Should Brief Interview for Mental Status (C0200-C0500) be conducted? (3-day assessment period) Attempt to conduct interview with all patients.      0. No (patient is rarely/never understood) Skip to C0900. Memory/Recall Ability   1. Yes Continue to C0200. Repetition of Three Words   Yes     Brief Interview for Mental Status (BIMS)    C0200. Repetition of Three Words      Ask patient: "I am going to say three words for you to remember. Please repeat the words after I have said all three. The words are: sock, blue and bed. Now tell me the three words."    Number of words repeated by patient after first attempt:   3. Three   2. Two   1. One   0. None     Three    After the patient's first attempt say: "I will repeat each of the three words with a cue and ask you about them later: sock, something to wear; blue, a color; bed, a piece of furniture." You may repeat the words up to two more times             Brief Interview for Mental Status (BIMS) - Continued    C0300. Temporal Orientation:  Year, Month, Day      A. Ask patient: "Please tell me what year it is right now." Patient's answer is:   3. Correct   2. Missed by 1 year   1. Missed by 2 to 5 years   0. Missed by more than 5 years or no answer     Correct       B. Ask patient: "What month are we in right now?" Patient's answer is:   2. Accurate within 5 days   1. Missed by 6 days to 1 month  0. Missed by more than 1 month or no answer     Accurate within 5 days       C. Ask patient: "What day of the week is today?" Patient's answer is:   1. Correct   0. Incorrect or no answer     Correct     C0400. Recall    Ask patient: "Let's go back to the first question. What were those three words that I asked you to repeat?" If unable to remember a word, give cue (i.e., something to wear; a color; a piece of furniture) for that word.     A. Recalls "sock?"   2. Yes, no cue required  1. Yes, after cueing ("something to wear")  0. No, could not recall     Yes, no cue required     B. Recalls "blue?"   2. Yes, no cue required   1. Yes, after cueing ("a color")  0. No, could not recall     Yes, no cue required     C. Recalls "bed?"  2. Yes, no cue required   1. Yes, after cueing ("a piece of furniture")   0. No, could not recall     Yes, no cue required   C0500. BIMS Summary Score.      Select "Yes", if the patient was unable to complete the interview.  Select "No", if the patient was able to complete the interview.   No     C0600. Should the Staff Assessment for Mental Status [phone]) be Conducted?      0. No (patient was able to complete Brief Interview for Mental Status)   1. Yes (patient was unable to complete Brief Interview for Mental Status) Continue to C0900. Memory/Recall Ability     No (patient was able to complete Brief Interview for Mental Status)       Staff Assessment for Mental Status.    Do not conduct if Brief Interview for Mental Status (C0200-C0500) was completed.    C0900. Memory/Recall Ability.    Check all that the patient was normally able  to recall    A. Current season    B. Location of own room    C. Staff names and faces    E. That he or she is in a hospital/hospital unit    Z. None of the above were recalled     Current Season, Location of own room, Staff names and faces and That he or she is in a hospital/hospital unit     Records and imaging studies reviewed.  Discussed with the pt at length re: status, prognosis and tx.    Also discussed with NSG.

## 2015-11-11 NOTE — PT Eval Note (Signed)
[facility]   Physical Therapy Evaluation   Patient: Matthew [name]    MRN#: [id number]   Unit: [facility] TOWER 8  Bed: F830/F830.01        Discharge Recommendations:   Discharge Recommendation: SNF   DME Recommendation: DME Recommended for Discharge:  (TBD)      If the above recommended discharge disposition is not available patient will need: home with incidental hands-on assist with OOB mobility, HHPT  Assistance : Min A  Equipment : in place  (Other): Will need to be able to climb stairs at dgt's home                                                  Discharge recommendations are subject to change based on patient's progress / regression. Please refer to the most recent treatment note for the current recommendation.    Left with SCDs           :on  Left with bed alarm   :off  Left with chair alarm :off  Left with fall mats       :in place    Assessment:   Matthew [name] is a 74 y.o. male admitted 11/10/2015.  Pt presents with  reduced mobility from baseline level of function and would benefit from PT services to optimize independence with mobility and safety prior to discharging to the next level of care as recommended above.             Impairments: Assessment: Decreased endurance/activity tolerance;Decreased functional mobility;Decreased balance;Gait impairment.     Therapy Diagnosis: mobility impairment    Rehabilitation Potential: Prognosis: Good;With continued PT status post acute discharge    Treatment Activities: Evaluation of mobility, strength and ROM.  Educated the patient to role of physical therapy, plan of care, goals of therapy and [place] recommendations and safety with mobility.    Education: Lumbar precautions    Plan:   Treatment/Interventions: Gait training;Stair training;Functional transfer training;Patient/family training;Equipment eval/education;Bed mobility;Compensatory technique education     PT Frequency: 4-5x/wk   Risks/Benefits/POC Discussed with Pt/Family: With  patient/family        Precautions and Contraindications:   Weight Bearing Status: no restrictions  Precaution Instructions Given to Patient: Yes  Spinal Precautions: no bending, no twisting, no lifting    Consult received for Ranee Gosselin for PT Evaluation and Treatment.  Patient's medical condition is appropriate for Physical therapy intervention at this time.    Medical Diagnosis: Lumbar stenosis [M48.06]  Spondylolisthesis of lumbar region [M43.16]      History of Present Illness:   Matthew [name] is a 74 y.o. male admitted on 11/10/2015 s/p  lumbar laminectomy and fusion  by Dr. Marjo Bicker for intractible LBP        Past Medical/Surgical History:  Past Medical History   Diagnosis Date   . Hypertension      controlled   . Syncopal episodes approx 2012     1x episode - had negative cardiac work-up - no episodes sinice   . BPH (benign prostatic hypertrophy) 2007     w/ hx of obstruction - s/p suprapubic prostatectomy   . History of pancreatitis 2007     had microscopic stones - ERCP w/ stent done   . Spinal stenosis    . Spondylolisthesis    .  Fibromyalgia      in remission   . Gastroesophageal reflux disease    . 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 Ercp, stent  2007   . Suprapubic prostatectomy  2007   . Cholecystectomy  1980   . Vasectomy  1980's   . Cervical fusion  1997     states has full ROM of neck   . Appendectomy  1952   . Tonsillectomy  as child         X-Rays/Tests/Labs:  Nil of note      Social History:   Prior Level of Function:  Prior level of function: Ambulates with assistive device within the past 2 months, previously was indep.  Assistive Device: Front wheel walker x 2 months as back got worse)  Baseline Activity Level: Household ambulation  DME Currently at Home: Front wheel walker    Home Living Arrangements:  Living Arrangements: Alone  Type of Home: House  Home Layout: One level at  his place, (dgt's home has stairs)  DME Currently at Home: Front wheel walker  Home Living - Notes / Comments:  (will stay with dgt on [address])    Subjective:   Patient is agreeable to participation in the therapy session. Family and/or guardian are agreeable to patient's participation in the therapy session. Nursing clears patient for therapy.       Patient Goal:  (rehab prior to home)     "I'm going to rehab"    Pain Assessment  Pain Assessment: Numeric Scale (0-10)  Pain Score: 7-severe pain  POSS Score: Awake and Alert  Pain Location:  (lower back at Sx site)  Pain Intervention(s): Medication (See eMAR);Repositioned    Objective:   Observation of Patient/Vital Signs:  Patient is in bed , 2 dgts at bedside     Observation of Patient/Vital signs:  Inspection/Posture:  (Lumbar JP drain, O2 via NC 2L, catheter)    Cognition/Neuro Status  Arousal/Alertness: Appropriate responses to stimuli  Orientation Level: Oriented X4         Musculoskeletal Examination:  Gross ROM  Neck/Trunk ROM: reduced by 25%  Right Upper Extremity ROM: within functional limits  Left Upper Extremity ROM: within functional limits  Right Lower Extremity ROM: within functional limits  Left Lower Extremity ROM: within functional limits    Gross Strength  Neck/Trunk/ extremities Strength: WFL         Functional Mobility:  Rolling: Minimal Assist;Logroll  Supine to Sit: Minimal Assist  Sit to Supine: Minimal Assist  Sit to Stand: Minimal Assist (at [facility])  Stand to Sit: Minimal Assist    Transfers  Stand Pivot Transfers: Contact Guard Assist  Device Used for Functional Transfer: front-wheeled walker    Ambulation:  Ambulation: Contact Guard Assist;with front-wheeled walker  Ambulation Distance (Feet): 100 Feet  Pattern: decreased cadence;decreased step length (heavy reliance on UEs)  Stair Management:  (NT)     Balance:  Sitting - Static: Good  Sitting - Dynamic: Fair  Standing - Static: Good (at FWW)  Standing - Dynamic: Fair (with FWW)    Participation  and Activity Tolerance:  Participation Effort: good  Endurance:  (sta s 98% on RA after activity)      Patient left with call bell within reach, all needs met and all questions answered, RN notified of session outcome and patient  response.      Goals:   Goals  Goal Formulation: With patient/family  Time for Goal Acheivement: 3 visits  Pt Will Go Supine To Sit: with stand by assist;to maximize functional mobility and independence (via log roll)  Pt Will Perform Sit To Supine: with stand by assist;with log roll;to maximize functional mobility and independence  Pt Will Transfer Bed/Chair: with rolling walker;with supervision;to maximize functional mobility and independence  Pt Will Ambulate: 151-200 feet;with rolling walker;with supervision;to maximize functional mobility and independence  Pt Will Go Up / Down Stairs: 3-5 stairs;with contact guard assist;With rail;With SPC;to maximize functional mobility and independence         Time of treatment:   PT Received On: 11/11/15  Start Time: 1210  Stop Time: 1250  Time Calculation (min): 40 min      Harlan Stains, PT, Manufacturing engineer, pager [phone] / [employer] [facility] [phone]

## 2015-11-11 NOTE — Plan of Care (Signed)
Problem: Day of Surgery- Lumbar Discectomy/Laminectomy/Fusion  Goal: Free from Infection  Outcome: Progressing  VSS, remain afebrile. Surgical dressing cdi, JP drain intact and patent. Encouraged to use IS. Foley cath intact and patent, draining amber, hazzy urine. Will continue to monitor.   Goal: Mobility/activity is maintained at optimum level for patient  Outcome: Progressing  Log roll performed in bed, pt dangled at bedside. Denies dizziness. Ablef to turn self, and HOB self regulated.   Goal: Pain at adequate level as identified by patient  Outcome: Progressing  Pain controlled with Dilaudid IV 1 mg and Percocet as ordred. Denies sob, chest pain, or respiratory distress. Will continue to monitor.   Goal: Patient will maintain normal GI status  Outcome: Progressing  Pt denies nausea and vomiting, active normal bowel sounds. Abdomen soft and non-tender. Stool softener given as ordered. Will continue to monitor.  Goal: Stable Neurovascular Status  Outcome: Progressing  Pt denies numbness/ tingling. NVI, SCD's on. Will continue to monitor.     Comments:   Pt received from pacu awake, alert and oriented. Breathing regular, easy and unlabored. Safety precautions maintained, hourly rounding done. Pt oriented to the room and call light. Assisted with his need. Will continue to monitor.

## 2015-11-11 NOTE — Progress Notes (Signed)
PROGRESS NOTE    Date Time: 11/11/2015 10:51 AM  Patient Name: Matthew [name]  Attending Physician: Clelia Schaumann, MD    Assessment:   Stable.    Plan:   Plan to remain on Foley catheter 2/2 history of BPH.  PT/OT for ambulation and mobilization  Subjective:   Expected incisional pain.  Has not yet ambulated.  Dressing clean, dry and intact. Drain in place, 220 cc, 7p-7a.  Medications:     Current Facility-Administered Medications   Medication Dose Route Frequency   . atorvastatin  10 mg Oral QHS   . lisinopril-amLODIPine 10-5 combo dose   Oral Daily   . pramipexole  0.25 mg Oral QHS   . senna-docusate  1 tablet Oral BID       Physical Exam:     Filed Vitals:    11/11/15 0653   BP: 119/57   Pulse: 92   Temp: 96.4 F (35.8 C)   Resp: 16   SpO2: 99%         Intake/Output Summary (Last 24 hours) at 11/11/15 1051  Last data filed at 11/11/15 0653   Gross per 24 hour   Intake   3540 ml   Output   1290 ml   Net   2250 ml       Drains:     Closed/Suction Drain Posterior Back Bulb 10 Fr. (Active)   Site Description Unable to view 11/10/2015 10:30 PM   Dressing Status Clean;Dry;Intact 11/10/2015 10:30 PM   Drainage Appearance Serosanguineous 11/10/2015 10:30 PM   Status To bulb suction 11/10/2015 10:30 PM   Output (mL) 30 mL 11/11/2015  6:53 AM   Number of days:1       Urethral Catheter Coude 16 Fr. (Active)   Catheter necessity reviewed? Yes 11/11/2015  6:53 AM   Site Assessment Clean;Intact 11/11/2015  6:53 AM   Pericare (With foley) Yes 11/11/2015  6:53 AM   Urine Catheter Care Yes 11/11/2015  6:53 AM   Collection Container Standard drainage bag 11/11/2015  6:53 AM   Securement Method Stat lock 11/11/2015  6:53 AM   Positioned catheter tubing for unobstructed urine flow: Yes 11/11/2015  6:53 AM   Output (mL) 300 mL 11/11/2015  6:53 AM   Number of days:1       [REMOVED] Urethral Catheter Non-latex 8 Fr. (Removed)   Removed 11/10/15 1903   Number of days:0         Labs:     Lab Results   Component Value Date    WBC 7.98 11/11/2015     HGB 13.8 11/11/2015    HCT 41.9* 11/11/2015    MCV 95.0 11/11/2015    PLT 284 11/11/2015               Signed by: Dewaine Oats, C-ANP  Date/Time: 11/11/2015 10:51 AM

## 2015-11-12 ENCOUNTER — Inpatient Hospital Stay: Payer: Medicare Other

## 2015-11-12 LAB — HEPATIC FUNCTION PANEL
ALT: 18 U/L (ref 0–55)
AST (SGOT): 14 U/L (ref 5–34)
Albumin/Globulin Ratio: 1.4 (ref 0.9–2.2)
Albumin: 3.3 g/dL — ABNORMAL LOW (ref 3.5–5.0)
Alkaline Phosphatase: 99 U/L (ref 38–106)
Bilirubin Direct: 0.3 mg/dL (ref 0.0–0.5)
Bilirubin Indirect: 0.6 mg/dL (ref 0.0–1.0)
Bilirubin, Total: 0.9 mg/dL (ref 0.1–1.2)
Globulin: 2.4 g/dL (ref 2.0–3.7)
Protein, Total: 5.7 g/dL — ABNORMAL LOW (ref 6.0–8.3)

## 2015-11-12 LAB — HEMOLYSIS INDEX: Hemolysis Index: 26 — ABNORMAL HIGH (ref 0–18)

## 2015-11-12 LAB — TROPONIN I: Troponin I: 0.01 ng/mL (ref 0.00–0.09)

## 2015-11-12 LAB — LIPASE: Lipase: 20 U/L (ref 8–78)

## 2015-11-12 MED ORDER — PANTOPRAZOLE SODIUM 40 MG PO TBEC
40.0000 mg | DELAYED_RELEASE_TABLET | Freq: Every day | ORAL | Status: DC
Start: 2015-11-12 — End: 2015-11-18
  Administered 2015-11-12 – 2015-11-18 (×7): 40 mg via ORAL
  Filled 2015-11-12 (×7): qty 1

## 2015-11-12 MED ORDER — DEXTROMETHORPHAN-GUAIFENESIN ER 30-600 MG PO TB12
1.0000 | ORAL_TABLET | Freq: Two times a day (BID) | ORAL | Status: DC | PRN
Start: 2015-11-12 — End: 2015-11-18
  Administered 2015-11-12 – 2015-11-13 (×3): 1 via ORAL
  Filled 2015-11-12 (×5): qty 1

## 2015-11-12 MED ORDER — ZOLPIDEM TARTRATE 5 MG PO TABS
5.0000 mg | ORAL_TABLET | Freq: Every evening | ORAL | Status: DC | PRN
Start: 2015-11-12 — End: 2015-11-18
  Administered 2015-11-12 – 2015-11-15 (×4): 5 mg via ORAL
  Filled 2015-11-12 (×4): qty 1

## 2015-11-12 NOTE — PT Progress Note (Signed)
Physical Therapy Cancellation Note    Patient: Matthew [name]  ZDG:[id number]    Unit: F830/F830.01     11/12/15 18:00    Patient not seen for physical therapy secondary to in am patient nauseated and declined PT, in PM pt had been feeling better and was OOB to chair with RN x several hours and ambulated to hallway, just BTB when PT came to check on him, will follow up tomorrow.    Harlan Stains, PT

## 2015-11-12 NOTE — Progress Notes (Signed)
PROGRESS NOTE    Date Time: 11/12/2015 8:57 AM  Patient Name: Matthew [name]  Attending Physician: Clelia Schaumann, MD    Assessment:   Stable.  Post Op Day 2 s/p lami/fusion L4-S1, 11/10/2015, Dr Marjo Bicker.    Plan:   Pt to remain overnight.  Expect to d/c to [facility] tomorrow contingent upon bed availability and clearance by PT.    Continue PT/OT for ambulation and mobilization today.  Follow up in office in two weeks.   Subjective:   Expected incisional pain.  Ambulating well yesterday, has not yet been out of bed today.  C/O nausea, emesis ongoing since surgery.  Pt has hx of GERD, has been off of PPI post-op.  Pt also notes post nasal drip, requests medication. Dressing changed, wound clean.  D/C'ed J-P drain.  Have initiated Protonix and Mucinex.  Given nausea, allowing IV pain medications.  Titrate to oral format as tolerated.  Foley remains in place 2/2 hx of BPH.    Medications:     Current Facility-Administered Medications   Medication Dose Route Frequency   . atorvastatin  10 mg Oral QHS   . lisinopril-amLODIPine 10-5 combo dose   Oral Daily   . morphine  15 mg Oral Q8H SCH   . pantoprazole  40 mg Oral Daily   . pramipexole  0.25 mg Oral QHS   . senna-docusate  1 tablet Oral BID       Physical Exam:     Filed Vitals:    11/12/15 0704   BP: 156/90   Pulse: 121   Temp: 96 F (35.6 C)   Resp: 18   SpO2: 96%         Intake/Output Summary (Last 24 hours) at 11/12/15 0857  Last data filed at 11/12/15 0704   Gross per 24 hour   Intake      0 ml   Output   5265 ml   Net  -5265 ml       Drains:     Closed/Suction Drain Posterior Back Bulb 10 Fr. (Active)   Site Description Unable to view 11/10/2015 10:30 PM   Dressing Status Clean;Dry;Intact 11/10/2015 10:30 PM   Drainage Appearance Serosanguineous 11/10/2015 10:30 PM   Status To bulb suction 11/10/2015 10:30 PM   Output (mL) 0 mL 11/12/2015  7:04 AM   Number of days:2       Urethral Catheter Coude 16 Fr. (Active)   Catheter necessity reviewed? Yes 11/12/2015   7:04 AM   Site Assessment Clean;Intact 11/12/2015  7:04 AM   Pericare (With foley) Yes 11/12/2015  7:04 AM   Urine Catheter Care Yes 11/12/2015  7:04 AM   Collection Container Standard drainage bag 11/12/2015  7:04 AM   Securement Method Stat lock 11/12/2015  7:04 AM   Reason for Continuing Urinary Catheterization past POD 1 Acute urinary retention due to medication 11/11/2015  7:17 PM   Positioned catheter tubing for unobstructed urine flow: Yes 11/12/2015  7:04 AM   Output (mL) 100 mL 11/12/2015  7:04 AM   Number of days:2       [REMOVED] Urethral Catheter Non-latex 8 Fr. (Removed)   Removed 11/10/15 1903   Number of days:0         Labs:     Lab Results   Component Value Date    WBC 7.98 11/11/2015    HGB 13.8 11/11/2015    HCT 41.9* 11/11/2015    MCV 95.0 11/11/2015    PLT  284 11/11/2015               Signed by: Turri Redo, PA-C  Date/Time: 11/12/2015 8:57 AM

## 2015-11-12 NOTE — Plan of Care (Signed)
Problem: Safety  Goal: Patient will be free from injury during hospitalization  Outcome: Progressing  Pt is a ware to call  For assistant . Call light within reach    Problem: Day 2 Post-op- Lumbar Discectomy/Laminectomy/Fusion  Goal: Free from Infection  Outcome: Progressing  Dressing intact  Goal: Hemodynamic Stability  Outcome: Progressing  Vital signs stable  Goal: Mobility/activity is maintained at optimum level for patient  Outcome: Progressing  Pt ambulate with walker  Goal: Pain at adequate level as identified by patient  Outcome: Progressing  Complained pain, received pain med with relief  Goal: Patient will maintain normal GI status  Outcome: Progressing  Nausea received zofran  Goal: Stable Neurovascular Status  Outcome: Progressing  No numbness or tinging in extremities

## 2015-11-12 NOTE — Progress Notes (Signed)
[facility] Inpatient Rehab Note:  Following patient for acute inpatient rehab at [facility]   Yes, willing to accept patient pending OT eval at this time.  Can admit today if stable.  Please page at [phone] via extend on Saturday to admit.    Minta Balsam, RN-BC, CRRN  [facility]-Rehab Admission Liaison

## 2015-11-12 NOTE — OT Progress Note (Signed)
Occupational Therapy Note      Attempted OT this morning. Patient with + emesis all night and all morning. Patient unable to participate at this time. Will attempt at a later date/time.       Keturah Barre OTR/L  [id number]  11/12/2015  10:11 AM

## 2015-11-12 NOTE — Progress Notes (Signed)
MEDICINE CONSULT FOLLOW-UP    Date Time: 11/12/2015 12:18 PM  Patient Name: Matthew [name]  Attending Physician: Clelia Schaumann, MD  Consulting Physician: Isaias Cowman, MD      Assessment:     Patient Active Problem List    Diagnosis Date Noted   . Lumbar stenosis 11/11/2015   . 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 Hypertension 11/11/2015   . BPH (benign prostatic hypertrophy) 11/11/2015   . 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 Chest Pain- Etiology unclear however resolved without intervention after 2 minutes and has not recurred   - EKG without acute ischemic changes, troponin negative X 3, ruled out for ACS  - Does note history of acid reflux which may have contributed to chest pain v atypical in etiology   - CXR does not show acute pathology  - Would recommend starting home PPI for reflux     2. Nausea/Vomiting- Likely due to recent surgery and acid reflux  - Would recommend restarting home PPI  - Will obtain abdominal x-ray to evaluate for obstruction although less likely given patient with flatus  - Will obtain LFTs and Lipase to evaluate for other etiologies   - Already ruled out for ACS as above    CC: follow-up: Chest Pain     HPI/Subjective:  Reports emesis overnight, non-bloody, non-bilious.  Had not been re-started on home PPI after surgery.  Denies abdominal pain, chest pain, fevers, chills or shortness of breath.  States he is otherwise doing well and anxious to go to rehab.      Review of Systems:   Review of Systems - Negative except as noted above in HPI        Physical Exam:     Patient Vitals for the past 24 hrs:   BP Temp Temp src Pulse Resp SpO2   11/12/15 1141 152/84 mmHg 98 F (36.7 C) Oral (!) 109 16 94 %   11/12/15 0704 156/90 mmHg (!) 96 F (35.6 C) Oral (!) 121 18 96 %   11/12/15 0402 138/79 mmHg 97.7 F (36.5 C) - (!) 106 16 95 %   11/11/15 2300 127/66 mmHg (!) 96.2 F (35.7 C) - 97 17 96 %   11/11/15 1900 143/65 mmHg 97.7  F (36.5 C) - 77 18 97 %   11/11/15 1623 133/76 mmHg (!) 96.6 F (35.9 C) Oral (!) 114 16 94 %     Body mass index is 22.68 kg/(m^2).    Intake/Output Summary (Last 24 hours) at 11/12/15 1218  Last data filed at 11/12/15 0704   Gross per 24 hour   Intake      0 ml   Output   4315 ml   Net  -4315 ml       General: awake, alert, oriented x 3; no acute distress.  Cardiovascular: regular rate and rhythm, no murmurs, rubs or gallops  Lungs: clear to auscultation bilaterally, without wheezing, rhonchi, or rales  Abdomen: soft, non-tender, non-distended; no palpable masses, no hepatosplenomegaly, normoactive bowel sounds, no rebound or guarding  Extremities: no clubbing, cyanosis, or edema  Neuro: CN grossly intact, speech fluent, follows commands, 5/5 b/l UE and LE strength     Meds:     Medications were reviewed:    Labs:     Recent Labs      11/11/15   0402   WBC  7.98   HGB  13.8  HEMATOCRIT  41.9*   PLATELETS  284       Recent Labs      11/11/15   0402   SODIUM  137   POTASSIUM  4.9   CHLORIDE  108   CO2  21   BUN  17.0   CREATININE  1.2   GLUCOSE  223*   CALCIUM  8.8       No results for input(s): AST, ALT, ALKPHOS, PROT, ALB in the last 72 hours.    No results for input(s): PTT, PT, INR in the last 72 hours.    Imaging personally reviewed, including: Xr Chest Ap Portable    11/11/2015   No acute disease. Phillips Odor, MD 11/11/2015 3:02 PM               Case discussed with: Patient, RN, Family         Signed by: Isaias Cowman, MD

## 2015-11-12 NOTE — Anesthesia Postprocedure Evaluation (Signed)
Anesthesia Post Evaluation    Patient: Matthew [name]    Procedure(s) with comments:  LAMINECTOMY, POSTERIOR LUMBAR, DECOMP, FUSION, LEVEL 2 - L4-S1 LAMINECTOMY/FUSION    Anesthesia type: general    Last Vitals:   Filed Vitals:    11/11/15 2300   BP: 127/66   Pulse: 97   Temp: 35.7 C (96.2 F)   Resp: 17   SpO2: 96%       Patient Location: Phase I PACU      Post Pain: Patient not complaining of pain, continue current therapy    Mental Status: awake    Respiratory Function: tolerating face mask    Cardiovascular: stable    Nausea/Vomiting: patient not complaining of nausea or vomiting    Hydration Status: adequate    Post Assessment: no apparent anesthetic complications, no reportable events and no evidence of recall          Anesthesia Qualified Clinical Data Registry    Central Line      CVC insertion : NO                                               Perioperative temperature management      General/neuraxial anesthesia > or = 60 minutes (excluding CABG) : YES              > Use of intraoperative active warming : YES              > Temperature > or = 36 degrees Centigrade (96.8 degrees Farenheit) during time span from 30 minutes before up to 15 minutes after anesthesia end time : YES      Administration of antibiotic prophylaxis      Age > or = 18, with IV access, with surgical procedure for which antibiotic prophylaxis indicated, and not on chronic antibiotics : YES              > Prophylactic antibiotics within 1 hour of incision (or fluroroquinolone/vancomycin within 2 hours of incision) : YES    Medication Administration      Ordering or administration of drug inconsistent with intended drug, dose, delivery or timing : NO      Dental loss/damage      Dental injury with administration of anesthesia : NO      Difficult intubation due to unrecognized difficult airway        Elective airway procedure including but not limited to: tracheostomy, fiberoptic bronchoscopy, rigid bronchoscopy; jet ventilation; or  elective use of a device to facilitate airway management such as a Glidescope : NO                > Unanticipated difficult intubation post pre-evaluation : NO      Aspiration of gastric contents        Aspiration of gastric contents : NO                    Surgical fire        Procedure requiring electrocautery/laser : YES                > Ignition/burning in invasive procedure location : NO      Immediate perioperative cardiac arrest        Cardiac arrest in OR or PACU : NO  Unplanned hospital admission        Unplanned hospital admission for initially intended outpatient anesthesia service : NO      Unplanned ICU admission        Unplanned ICU admission related to anesthesia occurring within 24 hours of induction or start of MAC : NO      Surgical case cancellation        Cancellation of procedure after care already started by anesthesia care team : NO      Post-anesthesia transfer of care checklist/protocol to PACU        Transfer from OR to PACU upon case conclusion : YES              > Use of PACU transfer checklist/protocol : YES     (Includes the key elements of: patient identification, responsible practitioner identification (PACU nurse or advanced practitioner), discussion of pertinent history and procedure course, intraoperative anesthetic management, post-procedure plans, acknowledgement/questions)    Post-anesthesia transfer of care checklist/protocol to ICU        Transfer from OR to ICU upon case conclusion : NO                    Post-operative nausea/vomiting risk protocol        Post-operative nausea/vomiting risk protocol : YES  Patient > or = 18 with care initiated by anesthesia team that has a risk factor screen for post-op nausea/vomiting (Includes male, hx PONV, or motion sickness, non-smoker, intended opioid administration for post-op analgesia.)    Anaphylaxis        Anaphylaxis during anesthesia services : NO    (Inclusive of any suspected transfusion reaction in  association with blood-bank confirmed blood product incompatibility)              Josanna Hefel J Jaree Dwight, 11/12/2015 2:41 AM

## 2015-11-12 NOTE — Plan of Care (Signed)
Problem: Day 1 Post-op- Lumbar Discectomy/Laminectomy/Fusion  Goal: Free from Infection  Outcome: Progressing  VSS. Afebrile. Dressing CDI. Drain intact, output of 80cc overnight. Foley catheter in place per orders.   Goal: Hemodynamic Stability  Outcome: Progressing  VSS. Afebrile. Output of 80cc overnight to drain. 1350cc of output from foley overnight.   Goal: Mobility/activity is maintained at optimum level for patient  Outcome: Progressing  Analgesics administered as needed. Spine precautions discussed. OOB with walker and one person assist. PT/OT to consult.   Goal: Pain at adequate level as identified by patient  Outcome: Progressing  Analgesics administered as needed. Repositioned as needed. No PCA.  Goal: Patient will maintain normal GI status  Outcome: Progressing  Nausea and some vomiting tonight. Zofran and Phenergen administered. No other vomiting episodes since 12/2 at 2200, still complains of nausea throughout night. Little relief from medication, will cont to monitor.   Goal: Patient/Patient Care Companion demonstrates understanding of disease process, treatment plan, medications, and discharge plan  Outcome: Progressing  Goal: Stable Neurovascular Status  Outcome: Progressing  NVI. SCDs applied.

## 2015-11-13 ENCOUNTER — Inpatient Hospital Stay: Payer: Medicare Other

## 2015-11-13 MED ORDER — PANTOPRAZOLE SODIUM 40 MG PO TBEC
40.0000 mg | DELAYED_RELEASE_TABLET | Freq: Every day | ORAL | 0 refills | Status: DC
Start: 2015-11-13 — End: 2015-11-25
  Filled 2015-11-13: fill #0

## 2015-11-13 MED ORDER — MORPHINE SULFATE ER 15 MG PO TBCR
15.0000 mg | EXTENDED_RELEASE_TABLET | Freq: Three times a day (TID) | ORAL | Status: DC
Start: 2015-11-13 — End: 2015-11-25

## 2015-11-13 MED ORDER — CYCLOBENZAPRINE HCL 5 MG PO TABS
5.0000 mg | ORAL_TABLET | Freq: Three times a day (TID) | ORAL | 0 refills | Status: DC | PRN
Start: 2015-11-13 — End: 2015-11-25
  Filled 2015-11-13: fill #0

## 2015-11-13 MED ORDER — DEXTROMETHORPHAN-GUAIFENESIN ER 30-600 MG PO TB12
1.0000 | ORAL_TABLET | Freq: Two times a day (BID) | ORAL | 0 refills | Status: DC | PRN
Start: 2015-11-13 — End: 2015-11-25
  Filled 2015-11-13: fill #0

## 2015-11-13 MED ORDER — HYDROMORPHONE HCL 2 MG PO TABS
2.0000 mg | ORAL_TABLET | ORAL | Status: DC | PRN
Start: 2015-11-13 — End: 2015-11-25

## 2015-11-13 NOTE — Progress Notes (Signed)
MEDICINE CONSULT FOLLOW-UP    Date Time: 11/13/2015 1:26 PM  Patient Name: Matthew [name]  Attending Physician: Clelia Schaumann, MD  Consulting Physician: Isaias Cowman, MD      Assessment:     Patient Active Problem List    Diagnosis Date Noted   . Lumbar stenosis 11/11/2015   . 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 Hypertension 11/11/2015   . BPH (benign prostatic hypertrophy) 11/11/2015   . 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 Chest Pain- Etiology unclear however resolved without intervention after 2 minutes and has not recurred   - EKG without acute ischemic changes, troponins negative X 3, ruled out for ACS  - Does note history of acid reflux which may have contributed to chest pain v atypical in etiology   - CXR does not show acute pathology  - Restarted on home PPI    2. Nausea/Vomiting- Likely due to recent surgery, acid reflux, and ileus noted on x-ray   - Restarted on home PPI  - Abdominal x-rays show ileus, no evidence of obstruction, will obtain follow up per radiology recommendations   - LFTs and Lipase without significant acute abnormality  - Already ruled out for ACS as above    CC: follow-up: Chest Pain     HPI/Subjective:  Denies emesis however reports nausea.  Denies chest pain, shortness of breath, abdominal pain, nausea, vomiting, and diarrhea.  States he is otherwise doing ok.  Eager to go to rehab.  Denies other acute concerns/complaints.      Review of Systems:   Review of Systems - Negative except as noted above in HPI        Physical Exam:     Patient Vitals for the past 24 hrs:   BP Temp Temp src Pulse Resp SpO2   11/13/15 0705 145/76 mmHg 99.1 F (37.3 C) Oral 90 18 96 %   11/13/15 0414 121/64 mmHg 97.7 F (36.5 C) - 94 16 95 %   11/12/15 2309 109/55 mmHg 98.1 F (36.7 C) - 93 16 95 %   11/12/15 1857 112/65 mmHg (!) 96.9 F (36.1 C) - 99 16 95 %   11/12/15 1530 137/75 mmHg (!) 96.8 F (36 C) Oral (!) 112 18 95 %     Body  mass index is 22.68 kg/(m^2).    Intake/Output Summary (Last 24 hours) at 11/13/15 1326  Last data filed at 11/13/15 0705   Gross per 24 hour   Intake      0 ml   Output   1400 ml   Net  -1400 ml       General: awake, alert, oriented x 3; no acute distress.  Cardiovascular: regular rate and rhythm, no murmurs, rubs or gallops  Lungs: clear to auscultation bilaterally, without wheezing, rhonchi, or rales  Abdomen: soft, non-tender, non-distended; no palpable masses, no hepatosplenomegaly, normoactive bowel sounds, no rebound or guarding  Extremities: no clubbing, cyanosis, or edema  Neuro: CN grossly intact, speech fluent, follows commands, 5/5 b/l UE and LE strength     Meds:     Medications were reviewed:    Labs:     Recent Labs      11/11/15   0402   WBC  7.98   HGB  13.8   HEMATOCRIT  41.9*   PLATELETS  284       Recent Labs      11/11/15   0402  SODIUM  137   POTASSIUM  4.9   CHLORIDE  108   CO2  21   BUN  17.0   CREATININE  1.2   GLUCOSE  223*   CALCIUM  8.8       Recent Labs      11/12/15   1309   AST (SGOT)  14   ALT  18   ALKALINE PHOSPHATASE  99   PROTEIN, TOTAL  5.7*   ALBUMIN  3.3*       No results for input(s): PTT, PT, INR in the last 72 hours.    Imaging personally reviewed, including: Xr Abdomen Portable    11/13/2015   Nonspecific bowel gas pattern favoring ileus. Continued radiographic follow-up recommended. Elizebeth Koller, MD 11/13/2015 12:57 PM     Xr Abdomen Portable    11/12/2015  There is no evidence of a bowel obstruction. Findings are suggestive of an ileus. An early small bowel obstruction cannot be excluded. Continued follow-up as clinically warranted. Stephannie Peters, MD 11/12/2015 3:55 PM               Case discussed with: Patient, RN, Family         Signed by: Isaias Cowman, MD

## 2015-11-13 NOTE — OT Eval Note (Signed)
[facility]   Occupational Therapy Evaluation     Patient: Matthew [name]    MRN#: [id number]   Unit: [facility] TOWER 8  Bed: F830/F830.01                                     Discharge Recommendations:   Discharge Recommendation: Acute Rehab      If Acute Rehab recommended discharge disposition is not available, patient will need hands on assist for all ADLs and functional mobility, BSC/reacher/sock aid equipment, and HH OT.     Assessment:   Matthew [name] is a 74 y.o. male admitted 11/10/2015 with back pain s/p L4-S1 lami/fusion. Pt with RLE knee buckling and generalized mild tremors with movement per pt "it's because of the pain." Tolerates short distance in room functional mobility to bedside chair. Education for spinal precautions with facilitation of log roll and compensatory techniques for ADLs. Pt would benefit from further skilled OT to return to IND level of function.     Impairments: Assessment: decreased strength;balance deficits;decreased independence with ADLs;decreased independence with IADLs    Therapy Diagnosis: decreased strength, decreased balance    Rehabilitation Potential: Prognosis: Good;With continued OT s/p acute discharge     Treatment Activities: OT EVAL, spinal precautions, ADL training, functional transfer training  Educated the patient to role of occupational therapy, plan of care, goals of therapy and safety with mobility and ADLs, spine precautions.    Plan:   OT Frequency Recommended: 3-4x/wk     Treatment Interventions: ADL retraining;Functional transfer training;Compensatory technique education;Equipment eval/education;Patient/Family training     Risks/benefits/POC discussed with patient.          Precautions and Contraindications:   Precautions  Weight Bearing Status: no restrictions  Precaution Instructions Given to Patient: Yes  Spinal Precautions: no bending, no twisting, no lifting  Other Precautions: falls      Consult received for Ranee Gosselin  for OT Evaluation and Treatment.  Patient's medical condition is appropriate for Occupational Therapy intervention at this time.    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 male admitted on 11/10/2015 with L4-S1 lami/fusion      Past Medical/Surgical History:  Past Medical History   Diagnosis Date   . Hypertension      controlled   . Syncopal episodes approx 2012     1x episode - had negative cardiac work-up - no episodes sinice   . BPH (benign prostatic hypertrophy) 2007     w/ hx of obstruction - s/p suprapubic prostatectomy   . History of pancreatitis 2007     had microscopic stones - ERCP w/ stent done   . Spinal stenosis    . Spondylolisthesis    . Fibromyalgia      in remission   . Gastroesophageal reflux disease    . 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 Ercp, stent  2007   . Suprapubic prostatectomy  2007   . Cholecystectomy  1980   . Vasectomy  1980's   . Cervical fusion  1997     states has full ROM of neck   .  Appendectomy  1952   . Tonsillectomy  as child        Imaging/Tests/Labs:  Fluoroscopy Greater Than 1 Hour    11/10/2015   Lumbosacral fusion under fluoroscopic guidance. Nelta Numbers, MD 11/10/2015 7:20 PM     Xr Chest Ap Portable    11/11/2015   No acute disease. Phillips Odor, MD 11/11/2015 3:02 PM     Xr Abdomen Portable    11/12/2015  There is no evidence of a bowel obstruction. Findings are suggestive of an ileus. An early small bowel obstruction cannot be excluded. Continued follow-up as clinically warranted. Stephannie Peters, MD 11/12/2015 3:55 PM         Social History:   Prior Level of Function:  Prior level of function: Independent with ADLs, Ambulates with assistive device  Assistive Device: Single point cane, Crutches  Baseline Activity Level: Community  ambulation  Driving: independent  Dressing - Upper Body: independent  Dressing - Lower Body: independent  Cooking: Yes  Feeding: independent  Bathing: independent  Grooming: independent  Toileting: independent  Employment: Retired  DME Currently at [employer]: [employer], Single point cane, Crutches    Home Living Arrangements:  Living Arrangements: Alone, Other (Comment) (plans to initially stay with daughter in the area)  Type of Home: House  Home Layout: Multi-level (3 STE, 10 steps between levels )  Bathroom Shower/Tub: Medical sales representative: Standard  DME Currently at Home: [employer] walker, Single point cane, Crutches  Home Living - Notes / Comments:  (will stay with dgt on [address])      Subjective:   Subjective: "This is the most pain I've ever been in."  Patient is agreeable to participation in the therapy session. Nursing clears patient for therapy.     Pain:  "Go to rehab then stay with my daughter."     Pain Assessment  Pain Assessment: Numeric Scale (0-10)  Pain Score: 6-moderate pain  Pain Location: Back      Objective:        Observation of Patient/Vital Signs:  Patient is in bed with dressings and SCD's in place.    Cognitive Status and Neuro Exam:  Cognition/Neuro Status  Arousal/Alertness: Appropriate responses to stimuli  Attention Span: Appears intact  Orientation Level: Oriented X4  Memory: Appears intact  Following Commands: independent  Safety Awareness: independent  Insights: Fully aware of deficits  Problem Solving: Able to problem solve independently  Behavior: attentive;calm;cooperative  Motor Planning: intact    Neuro Status  Behavior: attentive;calm;cooperative  Motor Planning: intact         Musculoskeletal Examination  Gross ROM  Right Upper Extremity ROM: within functional limits  Left Upper Extremity ROM: within functional limits    Gross Strength  Right Upper Extremity Strength: within functional limits  Left Upper Extremity Strength: within functional limits               Sensory/Oculomotor Examination            Activities of Daily Living  Self-care and Home Management  Eating: Independent  Grooming: setup;in chair  Bathing: Moderate Assist  UB Dressing: Minimal Assist  LB Dressing: Moderate Assist  Toileting: Moderate Assist    Functional Mobility:  Mobility and Transfers  Supine to Sit: Minimal Assist  Sit to Stand: Minimal Assist  Functional Mobility/Ambulation: Minimal Assist     Balance  Balance  Static Sitting Balance: Independent  Dyanamic Sitting Balance: Supervision  Static Standing Balance: Contact Guard Assist  Dynamic Standing Balance: Minimal Assist    Participation and Activity Tolerance  Participation and Endurance  Participation Effort: good  Endurance: Endurance does not limit participation in activity    Patient left with call bell within reach, all needs met, SCDs ,chair alarm  and all questions answered. RN notified of session outcome and patient response.       Goals:  Time For Goal Achievement: by time of discharge  ADL Goals  Patient will dress lower body: Modified Independent  Patient will toilet: Modified Independent  Mobility and Transfer Goals  Pt will perform functional transfers: Modified Independent, with rolling walker        Executive Fucntion Goals  Pt will follow spine precautions: independent, Recall 3/3, demonstration, to increase ability to complete ADLs                Synetta Fail, OT 11/13/2015        Time of treatment:   OT Received On: 11/13/15  Start Time: 0930  Stop Time: 1007  Time Calculation (min): 37 min

## 2015-11-13 NOTE — Progress Notes (Addendum)
SW paged [facility] awaiting call back for possible placement on Monday .  [facility] does not admit on Sundays if room # has not been previously given.

## 2015-11-13 NOTE — PT Progress Note (Addendum)
Physical Therapy Note    [facility]   Physical Therapy Treatment  Patient:  Matthew [name] MRN#:  [id number]  Unit: [facility] TOWER 8  Bed: F830/F830.01    Discharge Recommendations:   D/C Recommendations: Acute rehab  DME Recommendations:  (TBD at rehab)     If SNF recommended discharge disposition is not available, patient will need hands on assist for mobility, RW equipment, and HHPT.        Assessment:   Upon arrival, pt declined getting out of bed, reports he was in 6-7/10 level pain, had just returned to bed and undergone XR. Assisted pt in getting properly positioned in bed with min assist using bedrails and frequent VCs.  Also was able to do HS/AP during course of activity.  Pt continues to present with impaired mobility due to pain, decreased endurance, decreased balance, and decreased strength.  Will benefit from continued PT to assist pt in meeting goals and ensuring maximum level of function and safety.      Assessment: Decreased endurance/activity tolerance, Decreased functional mobility, Decreased balance, Gait impairment         Prognosis: Good, With continued PT status post acute discharge    Risks/Benefits/POC Discussed with Pt/Family: With patient/family    Prognosis: Good, With continued PT status post acute discharge  Risks/Benefits/POC Discussed with Pt/Family: With patient    Treatment Activities:  Bed mobility, therapeutic exercise (HS, AP)    Patient Education:  Patient education regarding role, plan of care, goals, benefits of physical therapy, therapeutic exercise, safety with mobility and ADLs, discharge recommendations, home safety, spinal precautions.    Plan:   Treatment/Interventions: Investment banker, operational, Stair training, Functional transfer training, Patient/family training, Equipment eval/education, Bed mobility, Compensatory technique education        PT Frequency: 4-5x/wk     Continue plan of care.       Precautions and Contraindications:   Falls  Spine    Precautions  Weight Bearing Status: no restrictions  Precaution Instructions Given to Patient: Yes  Spinal Precautions: no bending;no twisting;no lifting  Other Precautions: falls    Updated Medical Status/Imaging/Labs:   XR ABDOMEN  There is no evidence of a bowel obstruction. Findings are suggestive of  an ileus. An early small bowel obstruction cannot be excluded. Continued  follow-up as clinically warranted.    Lab Results   Component Value Date/Time    HGB 13.8 11/11/2015 04:02 AM    HEMATOCRIT 41.9* 11/11/2015 04:02 AM         Subjective: I'm not shaking from cold, but from pain.              Patient's medical condition is appropriate for Physical Therapy intervention at this time.  Patient is agreeable to participation in the therapy session. Nursing clears patient for therapy.    Pain:   Scale: 6-7/10  Location: Back  Intervention: Mobilized/positioned, nursing informed      Objective:   Patient is in bed with dressings, SCD's, urinary catheter, and peripheral IV in place.    Cognition :     Intact, cooperative    Functional Mobility:  Bed: SB-CG with rolling, min assist with scooting sideways/upwards into bed  Sit to Stand/stand to sit: Deferred  Transfers: Deferred    Ambulation: Deferred at this time per pt request    Patient Participation: fair-limited by pain  Patient Endurance: limited by apin    Patient left with call bell within reach, all needs met, SCDs donned,  fall mat in place, bed alarm yes, chair alarm n/a and all questions answered. RN notified of session outcome and patient response.       Goals:  Goals  Goal Formulation: With patient/family  Time for Goal Acheivement: 3 visits  Pt Will Go Supine To Sit: with stand by assist, to maximize functional mobility and independence (via log roll)  Pt Will Perform Sit To Supine: with stand by assist, with log roll, to maximize functional mobility and independence  Pt Will Transfer Bed/Chair: with rolling walker, with supervision, to maximize  functional mobility and independence  Pt Will Ambulate: 151-200 feet, with rolling walker, with supervision, to maximize functional mobility and independence  Pt Will Go Up / Down Stairs: 3-5 stairs, with contact guard assist, With rail, With [facility], to maximize functional mobility and independence    Time of Treatment  PT Received On: 11/13/15  Start Time: 1035  Stop Time: 1100  Time Calculation (min): 25 min  Treatment # 1 out of 3      Thank you,    Suzan Garibaldi, PT, Pager [phone]

## 2015-11-13 NOTE — Progress Notes (Signed)
PROGRESS NOTE    Date Time: 11/13/2015 9:53 AM  Patient Name: Matthew [name]  Attending Physician: Clelia Schaumann, MD    Assessment:   Stable.    Plan:   D/C to home.  Follow up in office in two weeks.   PT/OT for ambulation and mobilization  Subjective:   Expected incisional pain.  Pt declined PT yesterday am 2/2 c/o nausea and pain.  OOB with asisstance of nursing staff later in the day.  Sat in a bedside recliner and ambulated down the hall and back.  Better today, seen by OT.  Pain score 6/10.  Dressing clean, dry and intact. Dressing changed, wound clean.  Foley in place at this time.  Foley to be removed today.  Pt has not had a BM post-op but has had flatulence.  Nursing staff reports presence of bowel sounds in all quads.  Abd. Xray ordered by Medicine service for concern of ileus.  Pending clearance by Medicine and OT, expect discharge to [facility] later today.    Medications:     Current Facility-Administered Medications   Medication Dose Route Frequency   . atorvastatin  10 mg Oral QHS   . lisinopril-amLODIPine 10-5 combo dose   Oral Daily   . morphine  15 mg Oral Q8H SCH   . pantoprazole  40 mg Oral Daily   . pramipexole  0.25 mg Oral QHS   . senna-docusate  1 tablet Oral BID       Physical Exam:     Filed Vitals:    11/13/15 0705   BP: 145/76   Pulse: 90   Temp: 99.1 F (37.3 C)   Resp: 18   SpO2: 96%         Intake/Output Summary (Last 24 hours) at 11/13/15 0953  Last data filed at 11/13/15 0414   Gross per 24 hour   Intake      0 ml   Output   1300 ml   Net  -1300 ml       Drains:     Urethral Catheter Coude 16 Fr. (Active)   Catheter necessity reviewed? Yes 11/13/2015  4:14 AM   Site Assessment Clean;Intact;Dry 11/13/2015  4:14 AM   Pericare (With foley) Yes 11/13/2015  4:14 AM   Urine Catheter Care Yes 11/13/2015  4:14 AM   Collection Container Standard drainage bag 11/13/2015  4:14 AM   Securement Method Stat lock 11/13/2015  4:14 AM   Reason for Continuing Urinary Catheterization past POD 1  Acute urinary retention due to medication 11/13/2015  3:00 AM   Positioned catheter tubing for unobstructed urine flow: Yes 11/13/2015  4:14 AM   Output (mL) 150 mL 11/13/2015  4:14 AM   Number of days:3       [REMOVED] Closed/Suction Drain Posterior Back Bulb 10 Fr. (Removed)   Removed 11/12/15 0900   Site Description Unable to view 11/10/2015 10:30 PM   Dressing Status Clean;Dry;Intact 11/10/2015 10:30 PM   Drainage Appearance Serosanguineous 11/10/2015 10:30 PM   Status To bulb suction 11/10/2015 10:30 PM   Output (mL) 0 mL 11/12/2015  7:04 AM   Number of days:2       [REMOVED] Urethral Catheter Non-latex 8 Fr. (Removed)   Removed 11/10/15 1903   Number of days:0         Labs:     Lab Results   Component Value Date    WBC 7.98 11/11/2015    HGB 13.8 11/11/2015  HCT 41.9* 11/11/2015    MCV 95.0 11/11/2015    PLT 284 11/11/2015               Signed by: Charlesworth Redo, PA-C  Date/Time: 11/13/2015 9:53 AM

## 2015-11-14 ENCOUNTER — Inpatient Hospital Stay: Payer: Medicare Other

## 2015-11-14 LAB — CBC
Hematocrit: 43.2 % (ref 42.0–52.0)
Hgb: 14.1 g/dL (ref 13.0–17.0)
MCH: 31.1 pg (ref 28.0–32.0)
MCHC: 32.6 g/dL (ref 32.0–36.0)
MCV: 95.2 fL (ref 80.0–100.0)
MPV: 10.5 fL (ref 9.4–12.3)
Nucleated RBC: 0 /100 WBC (ref 0–1)
Platelets: 321 10*3/uL (ref 140–400)
RBC: 4.54 10*6/uL — ABNORMAL LOW (ref 4.70–6.00)
RDW: 12 % (ref 12–15)
WBC: 15.5 10*3/uL — ABNORMAL HIGH (ref 3.50–10.80)

## 2015-11-14 LAB — GFR: EGFR: >60

## 2015-11-14 LAB — URINALYSIS WITH MICROSCOPIC
Bilirubin, UA: NEGATIVE
Glucose, UA: 150 — AB
Ketones UA: 5 — AB
Nitrite, UA: NEGATIVE
Protein, UR: 100 — AB
Specific Gravity UA: 1.017 (ref 1.001–1.035)
Urine pH: 6 (ref 5.0–8.0)
Urobilinogen, UA: NORMAL mg/dL

## 2015-11-14 LAB — BASIC METABOLIC PANEL
BUN: 16 mg/dL (ref 9.0–28.0)
CO2: 25 mEq/L (ref 21–30)
Calcium: 9.1 mg/dL (ref 7.9–10.2)
Chloride: 102 mEq/L (ref 100–111)
Creatinine: 0.9 mg/dL (ref 0.5–1.5)
Glucose: 175 mg/dL — ABNORMAL HIGH (ref 70–100)
Potassium: 4.3 mEq/L (ref 3.5–5.3)
Sodium: 138 mEq/L (ref 135–146)

## 2015-11-14 LAB — BODY FLUID CELL COUNT
Body Fluid Lymphocytes: 3 %
Body Fluid Monocyte/Macrophage Cells: 10 %
Body Fluid Polymorphonuclear Cell: 87 %
Body Fluid RBC: 32000 /mm3
Body Fluid WBC: 19750 /mm3

## 2015-11-14 LAB — BODY FLUID CRYSTAL

## 2015-11-14 LAB — HEMOLYSIS INDEX: Hemolysis Index: 7 (ref 0–18)

## 2015-11-14 MED ORDER — SODIUM CHLORIDE 0.9 % IV MBP
4.5000 g | Freq: Three times a day (TID) | INTRAVENOUS | Status: DC
Start: 2015-11-14 — End: 2015-11-14

## 2015-11-14 MED ORDER — VANCOMYCIN HCL 1000 MG IV SOLR
1500.0000 mg | Freq: Once | INTRAVENOUS | Status: DC
Start: 2015-11-14 — End: 2015-11-14
  Filled 2015-11-14: qty 1500

## 2015-11-14 MED ORDER — INDOMETHACIN 50 MG RE SUPP
50.0000 mg | Freq: Two times a day (BID) | RECTAL | Status: DC | PRN
Start: 2015-11-14 — End: 2015-11-14
  Filled 2015-11-14 (×2): qty 1

## 2015-11-14 MED ORDER — SODIUM CHLORIDE 0.9 % IV BOLUS
1000.0000 mL | Freq: Once | INTRAVENOUS | Status: AC
Start: 2015-11-14 — End: 2015-11-14
  Administered 2015-11-14: 1000 mL via INTRAVENOUS

## 2015-11-14 MED ORDER — TAMSULOSIN HCL 0.4 MG PO CAPS
0.4000 mg | ORAL_CAPSULE | Freq: Every day | ORAL | Status: DC
Start: 2015-11-14 — End: 2015-11-18
  Administered 2015-11-15 – 2015-11-17 (×3): 0.4 mg via ORAL
  Filled 2015-11-14 (×3): qty 1

## 2015-11-14 NOTE — Plan of Care (Signed)
Problem: Safety  Goal: Patient will be free from injury during hospitalization  Outcome: Progressing  Patient free of fall. Patient rests in bed. Difficulty moving right leg due to severe pain. Right leg warmer than left leg. Capillary refill normal for both lower extremities. Patient able to move both feet. Right knee was warm and swollen. US doppler done. X-ray for right knee done. Patient had high temperature and high HR. WBC level increased. Medicine Md notified. Orthopedics consulted. Right knee aspiration done. 1 L NS bolus administered. Patient currently on clear liquid diet. Patient tolerated diet well. No nausea and vomiting.     Problem: Pain  Goal: Patient's pain/discomfort is manageable  Outcome: Progressing  Patient verbalized pain during transfer. Pain controlled by medication.

## 2015-11-14 NOTE — Progress Notes (Addendum)
MEDICINE CONSULT FOLLOW-UP    Date Time: 11/14/2015 3:34 PM  Patient Name: Matthew [name]  Attending Physician: Clelia Schaumann, MD  Consulting Physician: Isaias Cowman, MD      Assessment:     Patient Active Problem List    Diagnosis Date Noted   . Lumbar stenosis 11/11/2015   . 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 Hypertension 11/11/2015   . BPH (benign prostatic hypertrophy) 11/11/2015   . 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 Fevers- Concern for septic right knee given limited range of motion, fevers, and leukocytosis  - Orthopedics performed joint aspiration, negative for crystals, cell count pending  - Discussed with Orthopedics team, per Ortho will hold off on abx given possibility of intra-op cultures  - Made NPO in case of surgical intervention  - Knee x-rays show significant effusion possibly due to inflammatory arthropathy, duplex of RLE negative for DVT    - Surgical site appears C/[name]/I  - Blood cx obtained, UA shows hematuria (possibly due to traumatic cath) and not consistent with UTI, CXR shows developing right basal infiltrate however patient without clinical signs/symptoms of PNA (no cough, sputum production, shortness of breath)  - Discussed with Orthopedics NP Kingsley Plan     2. Nausea/Vomiting- Likely due to recent surgery, acid reflux, and ileus noted on x-ray   - Restarted on home PPI  - Abdominal x-rays show ileus, no evidence of obstruction  - Discussed with Orthopedics team about [name]/c standing MS Contin and using prn Morphine for pain control given ileus likely due to narcotics for pain control   - LFTs and Lipase without significant acute abnormality  - Already ruled out for ACS with serial negative troponins     HPI/Subjective:  Developed right knee pain and effusion overnight.  Having extreme difficulty with ROM.  Denies chest pain, shortness of breath, abdominal pain.  Reports nausea and vomiting has improved.  Febrile today  to 102.5 F.          Review of Systems:   Review of Systems - Negative except as noted above in HPI        Physical Exam:     Patient Vitals for the past 24 hrs:   BP Temp Temp src Pulse Resp SpO2   11/14/15 1127 155/81 mmHg (!) 102.5 F (39.2 C) - (!) 125 16 93 %   11/14/15 0653 154/81 mmHg 99.7 F (37.6 C) - (!) 116 16 94 %   11/14/15 0344 142/73 mmHg 100.5 F (38.1 C) Oral (!) 109 16 94 %   11/13/15 2314 111/71 mmHg (!) 100.6 F (38.1 C) Oral 100 16 94 %   11/13/15 1906 105/69 mmHg 100.3 F (37.9 C) Oral (!) 105 18 93 %   11/13/15 1539 133/74 mmHg 99.8 F (37.7 C) Oral (!) 118 18 93 %     Body mass index is 22.68 kg/(m^2).    Intake/Output Summary (Last 24 hours) at 11/14/15 1534  Last data filed at 11/14/15 0348   Gross per 24 hour   Intake      0 ml   Output   1000 ml   Net  -1000 ml       General: awake, alert, oriented x 3; mild distress due to pain in R knee   Cardiovascular: regular rate/tachycardic and rhythm, no murmurs, rubs or gallops  Lungs: clear to auscultation bilaterally, without wheezing, rhonchi, or rales  Abdomen: soft, non-tender,  non-distended; no palpable masses, no hepatosplenomegaly, normoactive bowel sounds, no rebound or guarding  Extremities: no clubbing, cyanosis, or edema  MSK: L knee with full ROM, R knee warm to touch, erythema present, ROM extremely limited   Neuro: CN grossly intact, speech fluent, follows commands, 5/5 b/l UE and LE strength     Meds:     Medications were reviewed:    Labs:     Recent Labs      11/14/15   1332   WBC  15.50*   HGB  14.1   HEMATOCRIT  43.2   PLATELETS  321       Recent Labs      11/14/15   1332   SODIUM  138   POTASSIUM  4.3   CHLORIDE  102   CO2  25   BUN  16.0   CREATININE  0.9   GLUCOSE  175*   CALCIUM  9.1       Recent Labs      11/12/15   1309   AST (SGOT)  14   ALT  18   ALKALINE PHOSPHATASE  99   PROTEIN, TOTAL  5.7*   ALBUMIN  3.3*       No results for input(s): PTT, PT, INR in the last 72 hours.    Imaging personally reviewed,  including: Xr Knee 1 Or 2 Views Right    11/14/2015  . Significant joint effusion with focal swelling. Findings are nonspecific but could reflect reactive effusion from an inflammatory arthropathy or occult trauma. 2. There is no evidence of acute bone injury 3. Mild nonspecific degenerative joint changes. The meniscal calcifications raising the concern for an underlying deposition disease Marty Heck, MD 11/14/2015 2:13 PM     Xr Chest Ap Portable    11/14/2015  . Developing right basilar infiltrate Marty Heck, MD 11/14/2015 2:11 PM     US Venous Duplex Doppler Leg Right    11/14/2015   No evidence of deep venous thrombosis involving the right lower extremity.  Will Bonnet, MD 11/14/2015 10:02 AM     Xr Abdomen Portable    11/14/2015   Improving ileus. Annamary Carolin, MD 11/14/2015 8:42 AM               Case discussed with: Patient, RN, Family         Signed by: Isaias Cowman, MD

## 2015-11-14 NOTE — Consults (Signed)
Orthopaedic Consult    Date Time: 11/15/2015 9:17 AM  Patient Name: Matthew Pais, MD Attending Physician        Assessment & Plan  Orthopaedic assessment:  74 y.o. male with right knee pain    Seen by orthopaedic resident at: 1315    Reductions/Procedures/Splinting performed (indicate type of Anesthesia used):  Spoke to patient about indications for knee aspiration. I explained that the benefits of knee aspiration include accurate diagnosis and pain relief. Risk of aspiration include but are not limited to nerve, vascular, and soft tissue injury as well as infection. Questions were answered and verbal consent was obtained. A timeout was performed and a sterile field was established. A needle aspiration was then preformed and the fluid sent to the lab.    ~50 cc serosanguinous fluid    Plan:    1. WBAT  2. FU joint fluid labs  3. Pain control  4. NPO    Radonna Ricker, PA-C  Orthopedic Trauma Physician Assistant  Page [phone] or call [phone] for questions    Attending Addendum/Attestation:  I have personally seen and examined this patient and have participated in their care. I agree with the clinical information, including the physical exam, patient history, and planning as documented above. In addition, I have edited this note to reflect my findings and plan as well as to incorporate any new data.    Await final cultures  19K WBC    Lucky Rathke MD       HPI   Matthew [name] is a 74 y.o. male  who presents with right knee pain and swelling. Pain is sharp, nonradiating, and worse with movement. He complains of fever. He complains of difficulty ambulating. At baseline, the patient ambulates with a cane. He denies numbness or tingling. He has. Patient denies any other injuries.      Past Medical and Surgical History     Past Medical History   Diagnosis Date   . Hypertension      controlled   . Syncopal episodes approx 2012     1x episode - had negative cardiac work-up - no episodes  sinice   . BPH (benign prostatic hypertrophy) 2007     w/ hx of obstruction - s/p suprapubic prostatectomy   . History of pancreatitis 2007     had microscopic stones - ERCP w/ stent done   . Spinal stenosis    . Spondylolisthesis    . Fibromyalgia      in remission   . Gastroesophageal reflux disease    . 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 Ercp, stent  2007   . Suprapubic prostatectomy  2007   . Cholecystectomy  1980   . Vasectomy  1980's   . Cervical fusion  1997     states has full ROM of neck   . Appendectomy  1952   . Tonsillectomy  as child       Past Social & Family History   Social History:   Social History     Social History   . Marital Status: Divorced     Spouse Name: N/A   . Number of Children: N/A   . Years of Education: N/A     Social History Main Topics   . Smoking  status: Never Smoker    . Smokeless tobacco: Never Used   . Alcohol Use: Yes      Comment: usually drinks 1 glass wine per night - none for past few months while on pain meds   . Drug Use: No   . Sexual Activity: Not on file     Other Topics Concern   . Not on file     Social History Narrative       Family History: History reviewed. No pertinent family history.     Relevant history reviewed as it pertains to current Orthopaedic issues    Review of Systems:   All other systems were reviewed and are negative. He denies nausea, vomiting, shortness of breath or chest pain.    Home Medications     Prior to Admission medications    Medication Sig Start Date End Date Taking? Authorizing Provider   ALPRAZolam Prudy Feeler) 0.5 MG tablet Take 0.5 mg by mouth 2 (two) times daily as needed.    09/21/15  Yes [provider]   amLODIPine-benazepril (LOTREL 5-10) 5-10 MG per capsule Take 1 capsule by mouth nightly.   Yes [provider]   atorvastatin (LIPITOR) 10 MG tablet Take 10 mg by mouth nightly.    08/08/15  Yes [provider]   BELSOMRA 20 MG Tab Take 20 mg by mouth nightly.    09/09/15  Yes [provider]   cyclobenzaprine (FLEXERIL) 5 MG tablet Take 1 tablet (5 mg total) by mouth 3 (three) times daily as needed for Muscle spasms. 11/13/15   Harcum Redo, PA   dextromethorphan-guaiFENesin (MUCINEX DM) 30-600 MG per 12 hr tablet Take 1 tablet by mouth 2 (two) times daily as needed. 11/13/15   Mentzel Redo, PA   HYDROmorphone (DILAUDID) 2 MG tablet Take 1 tablet (2 mg total) by mouth every 4 (four) hours as needed. 11/13/15   Richison Redo, PA   morphine (MS CONTIN) 15 MG Tab CR 12 hr tablet Take 1 tablet (15 mg total) by mouth every 8 (eight) hours. 11/13/15   Holst Redo, PA   pantoprazole (PROTONIX) 40 MG tablet Take 1 tablet (40 mg total) by mouth daily. 11/13/15   Boothe Redo, PA       Allergies   No Known Allergies    Radiology Studies (actual Orthopaedically relevant films reviewed and read by Orthopaedics):   No acute Fxs                     Physical Exam:   Patient is a 74 y.o. year old male who is alert, well appearing, and in no distress, mood is calm  Orientation: Fully Oriented     BP 136/69 mmHg  Pulse 109  Temp(Src) 99.5 F (37.5 C) (Oral)  Resp 17  Ht 1.778 m (5\' 10" )  Wt 71.7 kg (158 lb 1.1 oz)  BMI 22.68 kg/m2  SpO2 95%  71.7 kg (158 lb 1.1 oz)  1.778 m (5\' 10" )     Gait: unable to walk     Heart: regular rate   Lungs: unlabored      Right Upper Extremity:   Inspection:  No swelling, erythema, deformity, atrophy or hypertrophy noted  Palpation:  Tenderness-none  ROM:  within normal limits  Joint Stability: normal  Strength: normal  Skin: normal  Peripheral Vascular: normal  Reflexes: normal  Sensation: normal  Lymph Nodes: None Palpable  Coordination: Normal  Right Lower Extremity:   Inspection:  Swelling of knee,  Palpation:  Tenderness-severe at joint line  ROM:  severely limited  Joint Stability: stable knee  Strength: limited by pain, wiggles toes well  Skin:  normal  Peripheral Vascular: 2+ DP/PT, cap refill <2 sec  Reflexes: unable to assess   Sensation: SILT sp/dp/t  Lymph Nodes: None Palpable  Coordination: unable to assess      Left Upper Extremity:   Inspection:  No swelling, erythema, deformity, atrophy or hypertrophy noted  Palpation:  Tenderness-none  ROM:  within normal limits  Joint Stability: normal  Strength: normal  Skin: normal  Peripheral Vascular: normal  Reflexes: normal  Sensation: normal  Lymph Nodes: None Palpable  Coordination: Normal      Left Lower Extremity:   Inspection:  No swelling, erythema, deformity, atrophy or hypertrophy noted  Palpation:  Tenderness-none  ROM:  within normal limits  Joint Stability: normal  Strength: normal  Skin: normal  Peripheral Vascular: normal  Reflexes: normal  Sensation: normal  Lymph Nodes: None Palpable  Coordination: Normal     Pelvis:   Skin: normal  Palpation: Tenderness- none  Stability: normal

## 2015-11-14 NOTE — Progress Notes (Signed)
Foley remains as per MD request. Amber color urine present.

## 2015-11-14 NOTE — PT Progress Note (Signed)
Physical Therapy Note    [facility]   Physical Therapy Cancellation Note      Patient:  Matthew [name] MRN#:  [id number]  Unit:  [facility] TOWER 8 Room/Bed:  F830/F830.01    11/14/2015  Time: 1530      Pt not seen for physical therapy secondary to pt with septic knee and possibly is going to OR, per RN.  Will monitor status and follow as able.    Suzan Garibaldi, PT; Pager: [phone]

## 2015-11-14 NOTE — Plan of Care (Signed)
Problem: Safety  Goal: Patient will be free from injury during hospitalization  Outcome: Progressing    Problem: Pain  Goal: Patient's pain/discomfort is manageable  Outcome: Progressing    Problem: Day 3 Post-op- Lumbar Discectomy/Laminectomy/Fusion  Goal: Free from Infection  Outcome: Progressing  VSS. Temp to 100.6 overnight. Dressing CDI. Encouraged incentive spirometer.   Goal: Hemodynamic Stability  Outcome: Progressing  VSS. Temp up to 100.6 tonight. Tolerating some PO intake - no complaints of nausea tonight, no episodes of vomiting. No drain.   Goal: Mobility/activity is maintained at optimum level for patient  Outcome: Progressing  Analgesics administered prn. Log rollin bed. OOB during day to chair and ambulating in room with encouragement and one person assist with the walker.   Goal: Pain at adequate level as identified by patient  Outcome: Progressing  Analgesics administered prn. Patient able to reposition self. Muscle relaxers given prn.   Goal: Patient will maintain normal GI status  Outcome: Progressing  N/V reported during day, tolerating some PO intake overnight. No episodes of vomiting overnight.   Goal: Stable Neurovascular Status  Outcome: Progressing  NVI. Ankle pump exercises encouraged. SCDs applied.

## 2015-11-14 NOTE — Plan of Care (Addendum)
Discussed with orthopedic resident on call.  Given approximately 20,000 WBC in fluid from aspiration, no plans for OR at this time.  NPO status discontinued and clear liquid diet restarted.  Orthopedics will follow up final fluid culture results.  Given unclear source of fevers (gout v infectious etiology) will hold off on abx at this time given patient hemodynamically stable.  Blood cx obtained, urine cx obtained, CXR shows possible infiltrate however patient clinically without symptoms of PNA.  Will continue to follow closely.

## 2015-11-14 NOTE — Progress Notes (Signed)
PROGRESS NOTE    Date Time: 11/14/2015 8:34 AM  Patient Name: Matthew [name]  Attending Physician: Clelia Schaumann, MD    Assessment:   POD#4 Lumbar fusion  Ileus  Right knee pain and swelling    Plan:   Awaiting results of todays repeat abdom. Xray to confirm ileus  Clear liquids, resume IV fluids NS at 100cc/hr.  Keep Foley in - start Flomax  Doppler to rule out DVT RLE  Accucheck x1 - had glucose of 223 post op on 12/2  Discussed plan with Dr Wynetta Fines, MD  Subjective:   C/O continued nausea although Zofran is helpful. Severe 10/10 right knee pain will swelling and warm to touch - possible gout or DVT. Expected incisional pain. Will recheck glucose x1 and will be followed by Medicine. Has not been OOB since severe knee pain - will check with Xray.          Medications:     Current Facility-Administered Medications   Medication Dose Route Frequency   . atorvastatin  10 mg Oral QHS   . lisinopril-amLODIPine 10-5 combo dose   Oral Daily   . morphine  15 mg Oral Q8H SCH   . pantoprazole  40 mg Oral Daily   . pramipexole  0.25 mg Oral QHS   . senna-docusate  1 tablet Oral BID   . tamsulosin  0.4 mg Oral QD after dinner       Physical Exam:     Filed Vitals:    11/14/15 0653   BP: 154/81   Pulse: 116   Temp: 99.7 F (37.6 C)   Resp: 16   SpO2: 94%         Intake/Output Summary (Last 24 hours) at 11/14/15 0834  Last data filed at 11/14/15 0348   Gross per 24 hour   Intake      0 ml   Output   1000 ml   Net  -1000 ml       Drains:         [REMOVED] Closed/Suction Drain Posterior Back Bulb 10 Fr. (Removed)   Removed 11/12/15 0900   Site Description Unable to view 11/10/2015 10:30 PM   Dressing Status Clean;Dry;Intact 11/10/2015 10:30 PM   Drainage Appearance Serosanguineous 11/10/2015 10:30 PM   Status To bulb suction 11/10/2015 10:30 PM   Output (mL) 0 mL 11/12/2015  7:04 AM   Number of days:2       [REMOVED] Urethral Catheter Non-latex 8 Fr. (Removed)   Removed 11/10/15 1903   Number of days:0       [REMOVED] Urethral  Catheter Coude 16 Fr. (Removed)   Removed 11/13/15    Catheter necessity reviewed? Yes 11/13/2015  7:05 AM   Site Assessment Clean;Intact;Dry 11/13/2015  7:05 AM   Pericare (With foley) Yes 11/13/2015  7:05 AM   Urine Catheter Care Yes 11/13/2015  7:05 AM   Collection Container Standard drainage bag 11/13/2015  7:05 AM   Securement Method Stat lock 11/13/2015  7:05 AM   Reason for Continuing Urinary Catheterization past POD 1 Acute urinary retention due to medication 11/13/2015  7:05 AM   Positioned catheter tubing for unobstructed urine flow: Yes 11/13/2015  7:05 AM   Output (mL) 200 mL 11/14/2015  3:48 AM   Number of days:3         Labs:     Lab Results   Component Value Date    WBC 7.98 11/11/2015    HGB 13.8 11/11/2015  HCT 41.9* 11/11/2015    MCV 95.0 11/11/2015    PLT 284 11/11/2015               Signed by: Dewaine Oats, C-ANP  Date/Time: 11/14/2015 8:34 AM

## 2015-11-14 NOTE — Op Note (Signed)
Procedure Date: 11/10/2015     Patient Type: I     SURGEON: Clelia Schaumann MD  ASSISTANT:       FIRST ASSISTANT:  Vivien Rota.     SECOND ASSISTANT:  Ree Edman.     PREOPERATIVE DIAGNOSES:  1.  Lumbar spinal stenosis, L4-L5, L5-S1.  2.  Degenerative spondylolisthesis, L4-L5.  3.  Degenerative disk disease, L4-L5, L5-S1.     POSTOPERATIVE DIAGNOSES:  1.  Lumbar spinal stenosis, L4-L5, L5-S1.  2.  Degenerative spondylolisthesis, L4-L5.  3.  Degenerative disk disease, L4-L5, L5-S1.     TITLES OF PROCEDURES:  1.  Laminectomy, L3, L4, L5, and S1.  2.  Posterior fusion, L4 to S1.  3.  Spinal instrumentation, L4 to S1 (K2M Everest system).  4.  Bone marrow aspiration, iliac crest.  5.  Bone grafting.     ANESTHESIA:  General.     COMPLICATIONS:  None.     DRAINS:    One Jackson-Pratt to the subfascial space.     INDICATIONS FOR PROCEDURE:  The patient is a 74 year old gentleman with back and leg pain, which he  described as incapacitating and ranges in severity from a 6/10 to 10/10  based on activity level, who presents now for surgery due to failure of  nonoperative measures to alleviate his symptoms since it has become  progressively severe, making it difficult to function with even normal  activities of daily living.  Preoperatively, he was counseled regarding the  indications for procedure and potential complications.     DESCRIPTION OF PROCEDURE:  The patient received prophylactic antibiotics in the holding area.  He was  brought to the operating room.  After the induction of anesthesia,  sequential compression devices were applied to both legs.  A Foley catheter  was placed.  The patient was hooked up for neurological monitoring.  He was  then carefully placed in the prone position on the Wilson frame.  Care was  taken to ensure his abdomen was free of compression and all bony  prominences were well padded.  The back was prepped and draped in usual  fashion.  A vertical midline incision was made, dissection  carried sharply  through the skin and subcutaneous tissue.  The paraspinal musculature was  carefully and meticulously mobilized out to the tips of the transverse  processes from L3 to the sacrum.  Excellent exposure was obtained.  Deep  retractors were placed in the wound.  Epinephrine-soaked sponges were  placed into the intertransverse region.  A mark was placed.  A lateral  fluoroscopic view was taken to confirm the appropriate levels.  At this  junction of the spinous processes, the superior half of S1, all of L5, all  of L4, and the inferior aspect of L3 were resected with Leksell rongeur.   Central decompressive laminectomy was initiated with the Leksell rongeur,  and the Kerrison rongeur was then used to complete this at the laminectomy.   All of L5, all of L4, the superior half of S1, the inferior aspect of L3  were resected.  A Kerrison was then turned laterally, and the decompression  continued to completely open up the spinal canal.  Aggressive partial  medial facetectomy was performed bilaterally at L4-L5 and L5-S1.  The L3  lamina was squared off with the Kerrison rongeurs as well.  At the  completion of this decompression, the L4, L5, and S1 nerve roots were free  bilaterally of impingement, and the [facility] could  pass freely out  the L3 foramen.  The bone taken during this decompression was meticulously  cleared of soft tissue and morselized for use in the fusion.  At this  junction, a trocar was introduced into the iliac crest, and 50 mL of bone  marrow was aspirated in standard fashion.  The trocar was withdrawn.   Pressure was held until bleeding stopped.  The bone marrow was then spun  down and concentrated in the usual fashion and then used to saturate an  osteoconductive sponge.  Attention was directed back to the spine.   Beginning on the left, the pedicles of L4, L5, and S1 were entered with a  Midas Rex, hand probed to a depth of 45 mm at L4 and L5 and 40 mm at S1.   They were then  tapped.  The holes were palpated with pedicle filling and  gave evidence of good bony contact in all planes anteriorly, medially,  laterally, superiorly, and inferiorly.  Screws then inserted at L4-L5 and  S1.  They were stimulated with no evidence of nerve root irritation.  This  exact procedure was repeated with the same precautions on the right at L4,  L5, and S1.  AP and lateral fluoroscopic views revealed excellent position  of hardware.  Precut lordotic rods were then placed into the screw heads,  locked, and tightened down with set screws and a torque wrench in the usual  fashion.  A meticulous decortication was then performed at the transverse  processes of L4, L5, and the sacral ala bilaterally, the pars  interarticularis of L4 and L5, and the facet joints of L4-L5 and L5-S1.   All were decorticated creating excellent raw surface for fusion.  The dura  was protected with patties.  The bone graft materials were placed into the  intertransverse region, creating a copious fusion mass.  The patties were  removed.  The canal was checked.  There was no evidence of any bone graft  migration into the canal.  The exposed dura was covered with Avitene slurry  and Gelfoam.  Care was taken to ensure that each of the set screws were  locked and tightened down with a torque wrench in the usual fashion while  protected with a counter torque.  A Jackson-Pratt drain was placed in the  subfascial space.  The wound was then closed at the level of the fascia  with #1 Vicryl in a figure-of-eight stitch.  Subcutaneous tissue was  repaired with 2-0 Vicryl.  Skin was closed in a subcuticular fashion.  A  sterile dressing was applied.  The patient tolerated the procedure well.   There were no complications.  He was transferred to his bed and then to  recovery room without incident.           D:  11/13/2015 21:00 PM by Dr. Edman Circle. Marjo Bicker, MD [phone])  T:  11/14/2015 08:19 AM by Netta Cedars      Everlean Cherry: [id number]) (Doc ID: [id number])

## 2015-11-14 NOTE — OT Progress Note (Signed)
Occupational Therapy Note    [facility]   Occupational Therapy Treatment     Patient: LORENE SAMAAN    MRN#: [id number]   Unit: [facility] TOWER 8  Bed: F830/F830.01      Discharge Recommendations:   Discharge Recommendation: SNF   DME Recommended for Discharge:  (TBD at SNF)    If Acute Rehab recommended discharge disposition is not available, patient will need Max A for functional mobility and Max A for ADLs, continued OT at home with skilled, hands-on assist for all activity, and equipment: BSC, shower seat    Assessment:   Pt continues to benefit from OT services to maximize independence and safety with ADLs and functional mobility.      Pt with R knee pain, swelling, warm to touch. Pt sits EOB  for ~7-8 minutes. After session, RN reports R knee is septic and that pt may go to OR.  Recommend SNF.    Patient left without needs and call bell within reach. RN notified of session outcome.    Treatment Activities: ADLs, functional mobility    Educated the patient to role of occupational therapy, plan of care, goals of therapy and safety with mobility and ADLs.    Plan:   OT Frequency Recommended: 3-4x/wk     Goal Formulation: Patient  OT Plan  Risks/Benefits/POC Discussed with Pt/Family: With patient  Treatment Interventions: ADL retraining, Functional transfer training, Compensatory technique education, Equipment eval/education, Patient/Family training  Discharge Recommendation: Acute Rehab  DME Recommended for Discharge:  (TBD at Rehab)  OT Frequency Recommended: 3-4x/wk  OT - Next Visit Recommendation: 11/15/15    Continue plan of care.         Precautions and Contraindications:   Precautions  Weight Bearing Status: no restrictions  Precaution Instructions Given to Patient: Yes  Spinal Precautions: no bending, no twisting, no lifting  Other Precautions: falls      Updated Medical Status/Imaging/Labs:  US Venous Duplex Doppler Leg Right   IMPRESSION:     No evidence of deep venous  thrombosis involving the right  lower extremity.  XR Knee 1 Or 2 Views Right   IMPRESSION:    1. Significant joint effusion with focal swelling. Findings  are nonspecific but could reflect reactive effusion from an inflammatory  arthropathy or occult trauma.  2. There is no evidence of acute bone injury  3. Mild nonspecific degenerative joint changes. The meniscal  calcifications raising the concern for an underlying deposition disease           Subjective: "I can't do anymore"  (2/2 pain R knee)   Patient's medical condition is appropriate for Occupational Therapy intervention at this time.  Patient is agreeable to participation in the therapy session. Nursing clears patient for therapy.    Pain:  Scale: 8-10/10  Location: R knee>back  Intervention: Pain medication, repositioning      Objective:    Patient is in bed with IV, dressing to R knee, L SCD in place.    Cognition:  A&O to self, hospital  Follows directions with increased time, repetition, redirection  Delayed processing    Functional Mobility:  Supine to Sit: Max A  Sit to Stand: NT 2/2 back and R knee pain  Transfers: NT  Functional Ambulation: NT    Balance:  Static Sitting: Fair+  CGA/Min A for balance  Dynamic Sitting: NT  Static Standing: NT  Dynamic Standing: NT    Self Care and Home Management:  Eating: Indep after set-up  Grooming: Slow, not thorough, pt unable to fully attend to task   Bathing: NT  UE Dressing: NT  LE Dressing: NT  Toileting: NT      Participation: Fair+  Endurance: Fair    Therapeutic Exercises:  Incorporated into therapy session    Treatment Activities: ADLs, functional mobility    Educated the patient to role of occupational therapy, plan of care, goals of therapy and safety with mobility and ADLs, spine.      Patient left with call bell within reach, all needs met and all questions answered, RN notified of session outcome and patient response.    After session: L SCD on, fall mat in place, bed alarm ON, chair alarm  N/A    Goals:    Time For Goal Achievement: 5 visits  ADL Goals  Patient will dress lower body: Minimal Assist, with AE  Patient will toilet: Minimal Assist  Mobility and Transfer Goals  Pt will perform functional transfers: Minimal Assist, with rolling walker        Executive Fucntion Goals  Pt will follow spine precautions:  (consistently)                    Time of Treatment  OT Received On: 11/14/15  Start Time: 1415  Stop Time: 1445  Time Calculation (min): 30 min  Treatment # 1 of 5    Nigel Berthold, [place] Pager# [phone]

## 2015-11-15 ENCOUNTER — Encounter: Payer: Self-pay | Admitting: Orthopaedic Surgery

## 2015-11-15 DIAGNOSIS — M25561 Pain in right knee: Secondary | ICD-10-CM | POA: Insufficient documentation

## 2015-11-15 LAB — CBC
Hematocrit: 39.7 % — ABNORMAL LOW (ref 42.0–52.0)
Hgb: 13.1 g/dL (ref 13.0–17.0)
MCH: 31.9 pg (ref 28.0–32.0)
MCHC: 33 g/dL (ref 32.0–36.0)
MCV: 96.6 fL (ref 80.0–100.0)
MPV: 10.4 fL (ref 9.4–12.3)
Nucleated RBC: 0 /100 WBC (ref 0–1)
Platelets: 287 10*3/uL (ref 140–400)
RBC: 4.11 10*6/uL — ABNORMAL LOW (ref 4.70–6.00)
RDW: 12 % (ref 12–15)
WBC: 14.26 10*3/uL — ABNORMAL HIGH (ref 3.50–10.80)

## 2015-11-15 LAB — BASIC METABOLIC PANEL
BUN: 16 mg/dL (ref 9.0–28.0)
CO2: 24 mEq/L (ref 21–30)
Calcium: 8.8 mg/dL (ref 7.9–10.2)
Chloride: 100 mEq/L (ref 100–111)
Creatinine: 0.8 mg/dL (ref 0.5–1.5)
Glucose: 106 mg/dL — ABNORMAL HIGH (ref 70–100)
Potassium: 4.1 mEq/L (ref 3.5–5.3)
Sodium: 136 mEq/L (ref 135–146)

## 2015-11-15 LAB — GFR: EGFR: >60

## 2015-11-15 LAB — HEMOLYSIS INDEX: Hemolysis Index: 11 (ref 0–18)

## 2015-11-15 MED ORDER — ACETAMINOPHEN 325 MG PO TABS
650.0000 mg | ORAL_TABLET | Freq: Four times a day (QID) | ORAL | Status: DC | PRN
Start: 2015-11-15 — End: 2015-11-18
  Administered 2015-11-15: 650 mg via ORAL
  Administered 2015-11-17: 325 mg via ORAL
  Administered 2015-11-17 (×2): 650 mg via ORAL
  Filled 2015-11-15 (×4): qty 2

## 2015-11-15 MED ORDER — POLYETHYLENE GLYCOL 3350 17 G PO PACK
17.0000 g | PACK | Freq: Every day | ORAL | Status: DC
Start: 2015-11-15 — End: 2015-11-18
  Administered 2015-11-15 – 2015-11-18 (×3): 17 g via ORAL
  Filled 2015-11-15 (×3): qty 1

## 2015-11-15 NOTE — Plan of Care (Signed)
Problem: Safety  Goal: Patient will be free from injury during hospitalization  Outcome: Progressing  Intervention: Hourly rounding.  Maintain hourly rounding

## 2015-11-15 NOTE — OT Progress Note (Signed)
Occupational Therapy Note    [facility]   Occupational Therapy Treatment     Patient: Matthew [name]    MRN#: [id number]   Unit: [facility] TOWER 8  Bed: F830/F830.01      Discharge Recommendations:   Discharge Recommendation: SNF   DME Recommended for Discharge:  (TBD at SNF)    If SNF recommended discharge disposition is not available, patient will need Max A for functional mobility and Max A for ADLs, continued OT at home with skilled, hands-on assist for all activity, and equipment: BSC, shower seat    Assessment:   Pt continues to benefit from OT services to maximize independence and safety with ADLs and functional mobility.      Pt with continued low back and R knee main limiting mobility and ADL independence. Leaning forward and then scooting forward in chair, in preparation to stand, requires Max A. Pt moves very slowly, lots of cues needed for sequencing. Recommend SNF.    Patient left without needs and call bell within reach. RN notified of session outcome.    Treatment Activities: ADLs, functional mobility    Educated the patient to role of occupational therapy, plan of care, goals of therapy and safety with mobility and ADLs, weight bearing precautions.    Plan:   OT Frequency Recommended: 2-3x/wk     Goal Formulation: Patient  OT Plan  Risks/Benefits/POC Discussed with Pt/Family: With patient  Treatment Interventions: ADL retraining, Functional transfer training, Compensatory technique education, Equipment eval/education, Patient/Family training  Discharge Recommendation: SNF  DME Recommended for Discharge:  (TBD at SNF)  OT Frequency Recommended: 2-3x/wk  OT - Next Visit Recommendation: 11/16/15    Continue plan of care.         Precautions and Contraindications:   Precautions  Weight Bearing Status: no restrictions  Precaution Instructions Given to Patient: Yes  Spinal Precautions: no bending, no twisting, no lifting  Other Precautions: Falls      Updated Medical  Status/Imaging/Labs:  No new pertinent labs/tests    Subjective: "I need to get back to bed"       Patient's medical condition is appropriate for Occupational Therapy intervention at this time.  Patient is agreeable to participation in the therapy session. Nursing clears patient for therapy.    Pain:  Scale: 7/10 lower back, 10 in right knee  Intervention: Pain medication, repositioning      Pt comfortable at rest after back to bed.      Objective:    Patient is seated in a bedside chair with IV, L SCD in place.    Cognition:  A&O to self and place  Follows directions with increased time, repetition, redirection    Functional Mobility:  Supine to Sit: NT  Sit to Stand: Max A w/ RW        1st attempt, pt sits without warning  Transfers: Mod/Max A w/ RW  Functional Ambulation: Mod/Max A w/ RW few steps chair to bed  Sit to Supine: Mod/Max A    Balance:  Static Sitting: Good/CGA  Dynamic Sitting: Fair+  Static Standing: Fair w/ RW  Dynamic Standing: Fair- w/ RW    Self Care and Home Management:  Eating: Indep after set-up  Grooming: Indep washing face with washcloth  Bathing: Min/Mod A UB      UE Dressing: Min A hospital gown  LE Dressing: Dep B socks  Toileting: NT      Participation: Fair+  Endurance: Fair    Therapeutic Exercises:  Incorporated into therapy session    Treatment Activities: ADLs, functional mobility    Educated the patient to role of occupational therapy, plan of care, goals of therapy and safety with mobility and ADLs, weight bearing precautions.      Patient left with call bell within reach, all needs met and all questions answered, RN notified of session outcome and patient response.    After session: L SCD on, fall mat in place, bed alarm ON, chair alarm N/A    Goals:    Time For Goal Achievement: 5 visits  ADL Goals  Patient will dress lower body: Minimal Assist, with AE  Patient will toilet: Minimal Assist  Mobility and Transfer Goals  Pt will perform functional transfers: Minimal Assist,  with rolling walker        Executive Fucntion Goals  Pt will follow spine precautions:  (consistently)                    Time of Treatment  OT Received On: 11/15/15  Start Time: 1100  Stop Time: 1140  Time Calculation (min): 40 min  Treatment # 2 of 5    Nigel Berthold, [place] Pager# [phone]

## 2015-11-15 NOTE — Progress Notes (Signed)
Orthopedic Trauma Daily Progress Note    11/15/2015   8:25 AM    Matthew [name] is a 74 y.o. male   R Knee swelling and pain    Subjective: Patient alert and oriented. C/o moderate pain, especially when palpated, though better today than it was yesterday. Denies any numbness or tingling.   Patient Denies nausea, vomiting, or fevers.     Physical Exam:  Filed Vitals:    11/15/15 0634   BP: 136/69   Pulse: 109   Temp: 99.5 F (37.5 C)   Resp: 17   SpO2: 95%        Intake/Output Summary (Last 24 hours) at 11/15/15 0825  Last data filed at 11/14/15 1951   Gross per 24 hour   Intake      0 ml   Output    725 ml   Net   -725 ml     Results     Procedure Component Value Units Date/Time    Basic Metabolic Panel [[id number]]  (Abnormal) Collected:  11/15/15 0341    Specimen Information:  Blood Updated:  11/15/15 0625     Glucose 106 (H) mg/dL      BUN 84.1 mg/dL      Creatinine 0.8 mg/dL      Calcium 8.8 mg/dL      Sodium 324 mEq/L      Potassium 4.1 mEq/L      Chloride 100 mEq/L      CO2 24 mEq/L     Hemolysis index [[id number]] Collected:  11/15/15 0341     Hemolysis Index 11 Updated:  11/15/15 0625    GFR [[id number]] Collected:  11/15/15 0341     EGFR >60.0 Updated:  11/15/15 0625    CBC without differential [[id number]]  (Abnormal) Collected:  11/15/15 0341    Specimen Information:  Blood from Blood Updated:  11/15/15 0603     WBC 14.26 (H) x10 3/uL      Hgb 13.1 g/dL      Hematocrit 64.3 (L) %      Platelets 287 x10 3/uL      RBC 4.11 (L) x10 6/uL      MCV 96.6 fL      MCH 31.9 pg      MCHC 33.0 g/dL      RDW 12 %      MPV 10.4 fL      Nucleated RBC 0 /100 WBC     Culture + Gram Stain,Aerobic, Body Fluid [[id number]] Collected:  11/14/15 1330    Specimen Information:  Body Fluid from Synovial Fluid Updated:  11/14/15 2158    Narrative:      ORDER#: [id number]                                    ORDERED BY: Radonna Ricker  SOURCE: Synovial Fluid right knee                    COLLECTED:  11/14/15 13:30  ANTIBIOTICS AT COLL.:                                 RECEIVED :  11/14/15 19:44  Stain, Gram  FINAL       11/14/15 21:58  11/14/15   Moderate WBCs             No organisms seen  Culture and Gram Stain, Aerobic, Body FluidPENDING      AFB Culture & Smear [[id number]] Collected:  11/14/15 1330    Specimen Information:  Sputum from Synovial Fluid Updated:  11/14/15 1944    Fungal Culture & Smear [[id number]] Collected:  11/14/15 1330    Specimen Information:  Other from Synovial Fluid Updated:  11/14/15 1944    Urine culture [[id number]] Collected:  11/14/15 1332    Specimen Information:  Urine from Urine, Clean Catch Updated:  11/14/15 1745    CULTURE BLOOD AEROBIC AND ANAEROBIC [[id number]] Collected:  11/14/15 1332    Specimen Information:  Blood, Venipuncture Updated:  11/14/15 1745    Narrative:      1 BLUE+1 PURPLE    Body Fluid Cell Count [[id number]] Collected:  11/14/15 1330    Specimen Information:  Body Fluid from Synovial Fluid Updated:  11/14/15 1714     Body Fluid WBC 19750 /cumm      Body Fluid RBC 32000 /cumm      Body Fluid Source: Synovial      Body Fluid Polymorphonuclear Cell 87 %      Body Fluid Lymphocytes 3 %      Body Fluid Mononuclear Cells 10 %      Body Fluid Other Cell See Comment     Urinalysis with microscopic [[id number]]  (Abnormal) Collected:  11/14/15 1332    Specimen Information:  Urine Updated:  11/14/15 1501     Urine Type Clean Catch      Color, UA Yellow      Clarity, UA Clear      Specific Gravity UA 1.017      Urine pH 6.0      Leukocyte Esterase, UA Small (A)      Nitrite, UA Negative      Protein, UR 100 (A)      Glucose, UA 150 (A)      Ketones UA 5 (A)      Urobilinogen, UA Normal mg/dL      Bilirubin, UA Negative      Blood, UA Moderate (A)      RBC, UA TNTC (A) /hpf      WBC, UA 6 - 10 (A) /hpf     CBC without differential [[id number]]  (Abnormal) Collected:  11/14/15 1332    Specimen Information:  Blood from Blood Updated:  11/14/15 1456     WBC 15.50 (H) x10 3/uL       Hgb 14.1 g/dL      Hematocrit 56.3 %      Platelets 321 x10 3/uL      RBC 4.54 (L) x10 6/uL      MCV 95.2 fL      MCH 31.1 pg      MCHC 32.6 g/dL      RDW 12 %      MPV 10.5 fL      Nucleated RBC 0 /100 WBC     BODY FLUID CRYSTAL [[id number]] Collected:  11/14/15 1330    Specimen Information:  Body Fluid from Synovial Fluid Updated:  11/14/15 1453     Body Fluid Source: Synovial      Crystals UA see below     Hemolysis index [[id number]] Collected:  11/14/15 1332     Hemolysis Index 7 Updated:  11/14/15 1452  GFR [[id number]] Collected:  11/14/15 1332     EGFR >60.0 Updated:  11/14/15 1452    Basic Metabolic Panel [[id number]]  (Abnormal) Collected:  11/14/15 1332    Specimen Information:  Blood Updated:  11/14/15 1452     Glucose 175 (H) mg/dL      BUN 78.2 mg/dL      Creatinine 0.9 mg/dL      Calcium 9.1 mg/dL      Sodium 956 mEq/L      Potassium 4.3 mEq/L      Chloride 102 mEq/L      CO2 25 mEq/L           Right Lower Extremity:  skin intact                                                                                                                          +PF/DF/EHL                                                                                                                       Wiggles toes well                                                                                                                SILT SP/DP/T                                                                                                                     SILT to Toes  2+ DP/PT  Cap refill <2 secs  Moderate swelling of knee.  No erythema or other signs of infection    Assessment: s/p Procedure(s) (LRB):  LAMINECTOMY, POSTERIOR LUMBAR, DECOMP, FUSION, LEVEL 2 (N/A)    Plan:   Mobility: Out of bed as tolerated with PT/OT   Pain control: Continue to wean/titrate to appropriate oral regimen   DVT Prophylaxis (per Ortho  Trauma service Protocol): DVT prophylaxis is not indicated from an Orthopaedic perspective   Further surgical plans: No further Orthopaedic plans      RUE: WBAT   LUE:  WBAT   RLE:  WBAT   LLE:  WBAT   Disposition: Stable from Orthopaedic perspective, dispo per Primary team    Radonna Ricker, PA-C  Orthopedic Trauma Physician Assistant  Page [phone] or call [phone] for questions

## 2015-11-15 NOTE — Progress Notes (Signed)
PROGRESS NOTE    Date Time: 11/15/2015 7:32 PM  Patient Name: Matthew [name]  Attending Physician: Les Pou, MD    Assessment:   Stable.    Plan:   From an orthopeadic standpoint he may be discharged to [facility] for acute rehab. Pending bed availability.  Pt has been kept for Rt knee pain, concern of infection.  PT/OT for ambulation and mobilization  Subjective:   Expected incisional pain.  Limited ability to ambulate 2/2 R knee pain.  The Pt has been OOB twice today, able to walk only as far as the door to his room.  Dressing clean, dry and intact.   Wound clean.  Continue daily dressing changes.  Foley remains in place at this time.  Consider removal prior to discharge to acute rehab.    Medications:     Current Facility-Administered Medications   Medication Dose Route Frequency   . atorvastatin  10 mg Oral QHS   . lisinopril-amLODIPine 10-5 combo dose   Oral Daily   . morphine  15 mg Oral Q8H SCH   . pantoprazole  40 mg Oral Daily   . polyethylene glycol  17 g Oral Daily   . pramipexole  0.25 mg Oral QHS   . senna-docusate  1 tablet Oral BID   . tamsulosin  0.4 mg Oral QD after dinner       Physical Exam:     Filed Vitals:    11/15/15 1900   BP: 101/59   Pulse: 108   Temp: 99.9 F (37.7 C)   Resp: 18   SpO2: 93%         Intake/Output Summary (Last 24 hours) at 11/15/15 1932  Last data filed at 11/15/15 1500   Gross per 24 hour   Intake      0 ml   Output    400 ml   Net   -400 ml       Drains:         [REMOVED] Closed/Suction Drain Posterior Back Bulb 10 Fr. (Removed)   Removed 11/12/15 0900   Site Description Unable to view 11/10/2015 10:30 PM   Dressing Status Clean;Dry;Intact 11/10/2015 10:30 PM   Drainage Appearance Serosanguineous 11/10/2015 10:30 PM   Status To bulb suction 11/10/2015 10:30 PM   Output (mL) 0 mL 11/12/2015  7:04 AM   Number of days:2       [REMOVED] Urethral Catheter Non-latex 8 Fr. (Removed)   Removed 11/10/15 1903   Number of days:0       [REMOVED] Urethral Catheter Coude 16 Fr.  (Removed)   Removed 11/13/15    Catheter necessity reviewed? 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

## 2015-11-15 NOTE — PT Progress Note (Signed)
Physical Therapy Note    [facility]   Physical Therapy Treatment  Patient:  Matthew [name] MRN#:  [id number]  Unit: [facility] TOWER 8  Bed: F830/F830.01    Discharge Recommendations:   D/C Recommendations: SNF   DME Recommendations:  (TBD at SNF)     If SNF recommended discharge disposition is not available, patient will need mod assist for mobility, RW for equipment, and HHPT.        Assessment:   Pt making progress but continues to present with impaired mobility due to pain in back and now in knee, decreased endurance, decreased balance, and decreased strength.  Pt was pre-medicated for session, but required significant encouragement for participation due to pain.  Min-mod for bed mobility, mod assist for transfers using RW. Will benefit from continued PT to assist pt in meeting goals and ensuring maximum level of function and safety.      Assessment: Decreased endurance/activity tolerance, Decreased functional mobility, Decreased balance, Gait impairment         Prognosis: Good, With continued PT status post acute discharge    Risks/Benefits/POC Discussed with Pt/Family: With patient/family    Prognosis: Good, With continued PT status post acute discharge  Risks/Benefits/POC Discussed with Pt/Family: With patient    Treatment Activities:  Bed mobility, transfers, gait training, therapeutic exercise (incorporated into activity)    Patient Education:  Patient education regarding role, plan of care, goals, benefits of physical therapy, therapeutic exercise (AP), safety with mobility and ADLs,  spinal precautions, WBAT RLE    Plan:   Treatment/Interventions: Gait training, Stair training, Functional transfer training, Patient/family training, Equipment eval/education, Bed mobility, Compensatory technique education        PT Frequency: 4-5x/wk     Continue plan of care.       Precautions and Contraindications:   Falls  Spine   WBAT RLE       Updated Medical Status/Imaging/Labs:    Underwent aspiration on right knee  XR knee  Significant joint effusion with focal swelling. Findings  are nonspecific but could reflect reactive effusion from an inflammatory  arthropathy or occult trauma.  2. There is no evidence of acute bone injury  3. Mild nonspecific degenerative joint changes. The meniscal  calcifications raising the concern for an underlying deposition disease     [place] RLE  No evidence of deep venous thrombosis involving the right  lower extremity    CXR  Developing right basilar infiltrate    Lab Results   Component Value Date/Time    HGB 13.1 11/15/2015 03:41 AM    HEMATOCRIT 39.7* 11/15/2015 03:41 AM         Subjective: I don't see the point of this.     Patient's medical condition is appropriate for Physical Therapy intervention at this time.  Patient is agreeable to participation in the therapy session. Nursing clears patient for therapy.    Pain:   Scale: 7/10 in back; 8/10 in right LE  Intervention: Nursing provided pre-medicated, notified about pt's pain levels following surgery      Objective:   Patient is in bed with dressings (tegaderm on right knee from aspiration site, dressing over spine surgery site), SCD on left LE, and peripheral IV in place.    Cognition :     Intact, cooperative    Functional Mobility:  Bed: Min-mod for supine to sit with frequent VCs  Sit to Stand/stand to sit: Mod/Mod with second person to assist with RLE  Transfers: Mod with RW    Ambulation  Level of Assistance required: Mod, difficulty with weight shift  Ambulation Distance:  Several scooting steps with mod assist during transfer to chair  Device used: RW    Balance  Sitting (Static/dynamic): I/I  Standing (Static/dynamic):  CG-min/min-mod    Patient Participation: fair  Patient Endurance: fair- (limited by pain)    Patient left with call bell within reach, all needs met, SCDs donned to LLE, fall mat in place, bed alarm n/a, chair alarm yes and all questions answered. RN notified of session outcome  and patient response.       Goals:  Goals  Goal Formulation: With patient/family  Time for Goal Acheivement: 3 visits  Pt Will Go Supine To Sit: with stand by assist, to maximize functional mobility and independence (via log roll)  Pt Will Perform Sit To Supine: with stand by assist, with log roll, to maximize functional mobility and independence  Pt Will Transfer Bed/Chair: with rolling walker, with supervision, to maximize functional mobility and independence  Pt Will Ambulate: 151-200 feet, with rolling walker, with supervision, to maximize functional mobility and independence  Pt Will Go Up / Down Stairs: 3-5 stairs, with contact guard assist, With rail, With [facility], to maximize functional mobility and independence    Time of Treatment  PT Received On: 11/15/15  Start Time: 1030  Stop Time: 1110  Time Calculation (min): 40 min  Treatment # 2 out of 3      Thank you,    Suzan Garibaldi, PT, Pager [phone]

## 2015-11-15 NOTE — Progress Notes (Signed)
MEDICINE CONSULT FOLLOW-UP    Date Time: 11/15/2015 9:04 AM  Patient Name: Matthew [name] D  Attending Physician: Les Pou, MD  Consulting Physician: Les Pou, MD      Assessment:     Patient Active Problem List    Diagnosis Date Noted   . Lumbar stenosis 11/11/2015   . 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 Hypertension 11/11/2015   . BPH (benign prostatic hypertrophy) 11/11/2015   . 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 Fevers- Right Knee effusion with limited range of motion, fevers, and leukocytosis  - Orthopedics performed joint aspiration 12/6: negative for crystals, WBC 20K, Fluid Cx NGTD  - Orthopedics following  - Knee x-rays show significant effusion possibly due to inflammatory arthropathy, duplex of RLE negative for DVT    - Surgical site appears C/D/I  - Blood cx obtained, UA shows hematuria (possibly due to traumatic cath) and not consistent with UTI, CXR shows developing right basal infiltrate however patient without clinical signs/symptoms of PNA (no cough, sputum production, shortness of breath)   -PT/OT  -Still requiring IV Dilaudid for pain control    -Fisher Foley, voiding trial    2. Nausea/Vomiting- Improved today, was likely due to recent surgery, acid reflux, and ileus noted on x-ray   - Restarted on home PPI  - Abdominal x-rays show ileus, no evidence of obstruction  - LFTs and Lipase without significant acute abnormality  - Already ruled out for ACS with serial negative troponins   - Bowel Regimen     HPI/Subjective:  Knee pain improved compared to yesterday, however still has difficulty with ROM.    Review of Systems:   Review of Systems - Negative except as noted above in HPI        Physical Exam:     Patient Vitals for the past 24 hrs:   BP Temp Temp src Pulse Resp SpO2   11/15/15 0634 136/69 mmHg 99.5 F (37.5 C) Oral (!) 109 17 95 %   11/15/15 0343 137/71 mmHg 99.3 F (37.4 C) Oral (!) 107 16 93 %   11/14/15 2350 132/66 mmHg  99.4 F (37.4 C) Oral 91 16 91 %   11/14/15 2300 - 99.4 F (37.4 C) Oral - - -   11/14/15 1951 129/68 mmHg (!) 100.9 F (38.3 C) Oral (!) 108 16 93 %   11/14/15 1700 130/63 mmHg (!) 101.3 F (38.5 C) Oral (!) 111 - -   11/14/15 1127 155/81 mmHg (!) 102.5 F (39.2 C) - (!) 125 16 93 %     Body mass index is 22.68 kg/(m^2).    Intake/Output Summary (Last 24 hours) at 11/15/15 1308  Last data filed at 11/14/15 1951   Gross per 24 hour   Intake      0 ml   Output    725 ml   Net   -725 ml       General: awake, alert, oriented x 3; mild distress due to pain in R knee   Cardiovascular: regular rate and rhythm, no murmurs, rubs or gallops  Lungs: clear to auscultation bilaterally, without wheezing, rhonchi, or rales  Abdomen: soft, non-tender, non-distended; no palpable masses, no hepatosplenomegaly, normoactive bowel sounds, no rebound or guarding  Extremities: no clubbing, cyanosis, or edema  MSK: L knee with full ROM, R knee warm to touch with +TTP (medially), ROM extremely limited   Neuro: CN grossly intact, speech fluent, follows commands, 5/5  b/l UE and LE strength     Meds:     Medications were reviewed:    Labs:     Recent Labs      11/15/15   0341  11/14/15   1332   WBC  14.26*  15.50*   HGB  13.1  14.1   HEMATOCRIT  39.7*  43.2   PLATELETS  287  321       Recent Labs      11/15/15   0341  11/14/15   1332   SODIUM  136  138   POTASSIUM  4.1  4.3   CHLORIDE  100  102   CO2  24  25   BUN  16.0  16.0   CREATININE  0.8  0.9   GLUCOSE  106*  175*   CALCIUM  8.8  9.1       Recent Labs      11/12/15   1309   AST (SGOT)  14   ALT  18   ALKALINE PHOSPHATASE  99   PROTEIN, TOTAL  5.7*   ALBUMIN  3.3*       No results for input(s): PTT, PT, INR in the last 72 hours.    Imaging personally reviewed, including: Xr Knee 1 Or 2 Views Right    11/14/2015  . Significant joint effusion with focal swelling. Findings are nonspecific but could reflect reactive effusion from an inflammatory arthropathy or occult trauma. 2. There is  no evidence of acute bone injury 3. Mild nonspecific degenerative joint changes. The meniscal calcifications raising the concern for an underlying deposition disease Marty Heck, MD 11/14/2015 2:13 PM     Xr Chest Ap Portable    11/14/2015  . Developing right basilar infiltrate Marty Heck, MD 11/14/2015 2:11 PM     US Venous Duplex Doppler Leg Right    11/14/2015   No evidence of deep venous thrombosis involving the right lower extremity.  Will Bonnet, MD 11/14/2015 10:02 AM               Case discussed with: Patient, RN, Family         Signed by: Les Pou, MD

## 2015-11-15 NOTE — Plan of Care (Signed)
Problem: Health Promotion  Goal: Knowledge - disease process  Extent of understanding conveyed about a specific disease process.   Outcome: Progressing  Right knee still swollen, warm to touch, elevated right knee with pillow. Temp 99.4 overnight.     Problem: Pain  Goal: Patient's pain/discomfort is manageable  Outcome: Progressing  Pt c/o pain to right knee and medicated with schedule pain medication.     Problem: Urinary Incontinence  Goal: Perineal skin integrity is maintained or improved  Assess genitourinary system, perineal skin, labs (urinalysis), and history of incontinence to include past management, aggravating, and alleviating factors. Collaborate with interdisciplinary team and initiate plans and interventions as needed.   Outcome: Progressing  Foley draining amber urine and continue to monitor pt.

## 2015-11-15 NOTE — Progress Notes (Signed)
[facility] Inpatient Rehab Note:  Following patient for acute inpatient rehab at [facility].  Spoke with CM Kelton Pillar regarding change in  PT/OT recommendation.  Informed her that patient will still meet criteria for acute rehab @ [facility].  Will follow up with patient in the morning.  Will follow patient for rehab needs and medical clearance.    Scotty Court, BSN, RN-BC  [phone]  [facility]-Rehab Admissions Liaison  [phone]

## 2015-11-16 LAB — CBC
Absolute NRBC: 0 10*3/uL
Hematocrit: 39.3 % — ABNORMAL LOW (ref 42.0–52.0)
Hgb: 13.4 g/dL (ref 13.0–17.0)
MCH: 32 pg (ref 28.0–32.0)
MCHC: 34.1 g/dL (ref 32.0–36.0)
MCV: 93.8 fL (ref 80.0–100.0)
MPV: 9.8 fL (ref 9.4–12.3)
Nucleated RBC: 0 /100 WBC (ref 0–1)
Platelets: 330 10*3/uL (ref 140–400)
RBC: 4.19 10*6/uL — ABNORMAL LOW (ref 4.70–6.00)
RDW: 12 % (ref 12–15)
WBC: 12.4 10*3/uL — ABNORMAL HIGH (ref 3.50–10.80)

## 2015-11-16 MED ORDER — ALPRAZOLAM 0.5 MG PO TABS
0.5000 mg | ORAL_TABLET | Freq: Two times a day (BID) | ORAL | Status: DC | PRN
Start: 2015-11-16 — End: 2015-11-18
  Administered 2015-11-16: 0.5 mg via ORAL
  Filled 2015-11-16: qty 1

## 2015-11-16 MED ORDER — SODIUM CHLORIDE 0.9 % IV SOLN
INTRAVENOUS | Status: DC
Start: 2015-11-16 — End: 2015-11-17

## 2015-11-16 NOTE — Progress Notes (Signed)
Orthopedic Trauma Daily Progress Note    11/16/2015   7:08 AM    Matthew [name] is a 74 y.o. male   6 Days Post-Op  S/p Procedure(s) (LRB):  LAMINECTOMY, POSTERIOR LUMBAR, DECOMP, FUSION, LEVEL 2 (N/A)     Subjective: patient alert and pleasant this morning. Patient Denies nausea, vomiting, or fevers.     Physical Exam:  Filed Vitals:    11/16/15 0348   BP: 115/64   Pulse: 107   Temp: 99 F (37.2 C)   Resp: 18   SpO2: 93%        Intake/Output Summary (Last 24 hours) at 11/16/15 0708  Last data filed at 11/16/15 0348   Gross per 24 hour   Intake    480 ml   Output    500 ml   Net    -20 ml     Results     Procedure Component Value Units Date/Time    Culture + Gram Stain,Aerobic, Body Fluid [[id number]] Collected:  11/14/15 1330    Specimen Information:  Body Fluid from Synovial Fluid Updated:  11/15/15 1934    Narrative:      ORDER#: [id number]                                    ORDERED BY: Radonna Ricker  SOURCE: Synovial Fluid right knee                    COLLECTED:  11/14/15 13:30  ANTIBIOTICS AT COLL.:                                RECEIVED :  11/14/15 19:44  Stain, Gram                                FINAL       11/14/15 21:58  11/14/15   Moderate WBCs             No organisms seen  Culture and Gram Stain, Aerobic, Body FluidPRELIM      11/15/15 19:34  11/15/15   Culture no growth to date. Final report to follow      CULTURE BLOOD AEROBIC AND ANAEROBIC [[id number]] Collected:  11/14/15 1332    Specimen Information:  Blood, Venipuncture Updated:  11/15/15 1821    Narrative:      ORDER#: [id number]                                    ORDERED BY: Wynetta Fines, VIN  SOURCE: Blood, Venipuncture peripheral               COLLECTED:  11/14/15 13:32  ANTIBIOTICS AT COLL.:                                RECEIVED :  11/14/15 17:45  Culture Blood Aerobic and Anaerobic        PRELIM      11/15/15 18:21  11/15/15   No Growth after 1 day/s of incubation.      Urine culture [[id number]] Collected:  11/14/15 1332    Specimen  Information:  Urine from Urine, Clean Catch Updated:  11/15/15 1508    Narrative:      ORDER#: [id number]                                    ORDERED BY: SRINIVASAN, VIN  SOURCE: Urine, Clean Catch                           COLLECTED:  11/14/15 13:32  ANTIBIOTICS AT COLL.:                                RECEIVED :  11/14/15 17:45  Culture Urine                              FINAL       11/15/15 15:08  11/15/15   No growth of >1,000 CFU/ML, No further work      Body Fluid Cell Count [[id number]] Collected:  11/14/15 1330    Specimen Information:  Body Fluid from Synovial Fluid Updated:  11/15/15 1237     Body Fluid WBC 19750 /cumm      Body Fluid RBC 32000 /cumm      Body Fluid Source: Synovial      Body Fluid Polymorphonuclear Cell 87 %      Body Fluid Lymphocytes 3 %      Body Fluid Mononuclear Cells 10 %      Body Fluid Other Cell See Comment     Fungal Culture & Smear [[id number]] Collected:  11/14/15 1330    Specimen Information:  Other from Synovial Fluid Updated:  11/15/15 1036    Narrative:      ORDER#: [id number]                                    ORDERED BY: Radonna Ricker  SOURCE: Synovial Fluid right knee                    COLLECTED:  11/14/15 13:30  ANTIBIOTICS AT COLL.:                                RECEIVED :  11/14/15 19:44  Stain, Fungal                              FINAL       11/15/15 10:35  11/15/15   No Fungal or Yeast Elements Seen  Culture Fungus                             PENDING      AFB Culture & Smear [[id number]] Collected:  11/14/15 1330    Specimen Information:  Sputum from Synovial Fluid Updated:  11/15/15 1033    Narrative:      ORDER#: [id number]                                    ORDERED BY: Radonna Ricker  SOURCE: Synovial Fluid Right Knee  COLLECTED:  11/14/15 13:30  ANTIBIOTICS AT COLL.:                                RECEIVED :  11/14/15 19:44  Stain, Acid Fast                           FINAL       11/15/15 10:32  11/15/15   No Acid Fast Bacillus Seen  Culture Acid  Fast Bacillus (AFB)           PENDING            Right Lower Extremity:  skin intact                                                                                                                          +PF/DF/EHL                                                                                                                       Wiggles toes well                                                                                                                SILT SP/DP/T                                                                                                                     SILT  to Toes                                                                                                                        2+ DP/PT  Cap refill <2 secs          Assessment: s/p Procedure(s) (LRB):  LAMINECTOMY, POSTERIOR LUMBAR, DECOMP, FUSION, LEVEL 2 (N/A)    Plan:   Mobility: Out of bed as tolerated with PT/OT   Pain control: Continue to wean/titrate to appropriate oral regimen   DVT Prophylaxis (per Ortho Trauma service Protocol): DVT prophylaxis is not indicated from an Orthopaedic perspective   Foley catheter status: Does not have Foley   Further surgical plans: No further Orthopaedic plans      RUE: WBAT   LUE:  WBAT   RLE:  WBAT   LLE:  WBAT   Disposition: Stable from Orthopaedic perspective, will continue to watch cultures.   Ovidio Hanger NP-C  Orthopaedic Nurse Practitioner  [facility]  Spectra [phone]  [email or web address]

## 2015-11-16 NOTE — Progress Notes (Signed)
DISCHARGE PLANNING    Anticipated Dispo:     When medically stable, [facility]. Spoke with liaison. She is still following pt and will accept at d/c.     Transportation:     Emergency planning/management officer.     Kelton Pillar, MSW, [facility] Discharge Planner  [facility]  [place] [address]  Spectra [phone]

## 2015-11-16 NOTE — Plan of Care (Signed)
Problem: Safety  Goal: Patient will be free from injury during hospitalization  Outcome: Progressing  yes  Intervention: Assess patient's risk for falls and implement fall prevention plan of care per policy  Shift assessment  Intervention: Provide and maintain safe environment  Hourly rounding  Intervention: Use appropriate transfer methods  PT&OT  Intervention: Ensure appropriate safety devices are available at the bedside  Call light within reach  Intervention: Include patient/family/caregiver in decisions related to safety  Daughter  Intervention: Hourly rounding.  yes  Intervention: Assess for patient's risk for elopement and implement Elopement Risk plan per policy  N/A      Problem: Pain  Goal: Patient's pain/discomfort is manageable  Outcome: Progressing  Under control    Comments:   Patient not ready for [place] to SNF.When OOB pt,dizzy,BP low.At times confused.MD notified.

## 2015-11-16 NOTE — Progress Notes (Signed)
MEDICINE CONSULT FOLLOW-UP    Date Time: 11/16/2015 4:44 PM  Patient Name: Matthew [name] D  Attending Physician: Les Pou, MD  Consulting Physician: Les Pou, MD      Assessment:     Patient Active Problem List    Diagnosis Date Noted   . Acute pain of right knee    . Lumbar stenosis 11/11/2015   . 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 Hypertension 11/11/2015   . BPH (benign prostatic hypertrophy) 11/11/2015   . 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 Fevers- Right Knee effusion with limited range of motion, fevers, and leukocytosis  - Orthopedics performed joint aspiration 12/6: negative for crystals, WBC 20K, Fluid Cx NGTD  - Orthopedics following  - Knee x-rays show significant effusion possibly due to inflammatory arthropathy, duplex of RLE negative for DVT    - Surgical site appears C/D/I  - Blood cx obtained, UA shows hematuria (possibly due to traumatic cath) and not consistent with UTI, CXR shows developing right basal infiltrate however patient without clinical signs/symptoms of PNA (no cough, sputum production, shortness of breath)   -PT/OT  -Still requiring IV Dilaudid for pain control    - MRI Right Knee pending per Ortho recs    Blood Cx NGTD 12/5    2. Nausea/Vomiting-  Resolved. was likely due to recent surgery, acid reflux, and ileus noted on x-ray   - Restarted on home PPI  - Abdominal x-rays show ileus, no evidence of obstruction  - LFTs and Lipase without significant acute abnormality  - Already ruled out for ACS with serial negative troponins   - Bowel Regimen     Patient complaining of worsening neck pain.  On eval by PT patient with significant difficulty with passive ROM of Cervical neck.  Mild TTP along spiny process.    -MRI c spine pending.      Last fever 101 @ 12/6  1800  -Blood Cx NGTD  -confusion improved today.     Orthostatics +  -IVF overnight,       HPI/Subjective:  Knee pain improved compared to yesterday, however still  has difficulty with ROM.    Review of Systems:   Review of Systems - Negative except as noted above in HPI        Physical Exam:     Patient Vitals for the past 24 hrs:   BP Temp Temp src Pulse Resp SpO2   11/16/15 1138 115/57 mmHg 99.1 F (37.3 C) Oral (!) 106 18 95 %   11/16/15 0741 145/77 mmHg 99.8 F (37.7 C) Oral (!) 112 18 96 %   11/16/15 0348 115/64 mmHg 99 F (37.2 C) Oral (!) 107 18 93 %   11/15/15 2356 132/69 mmHg 97.3 F (36.3 C) Oral (!) 105 18 94 %   11/15/15 1900 101/59 mmHg 99.9 F (37.7 C) Oral (!) 108 18 93 %   11/15/15 1757 - (!) 101 F (38.3 C) Oral - - -   11/15/15 1730 - 100.4 F (38 C) Oral - - -   11/15/15 1700 - 98.8 F (37.1 C) Axillary - - -     Body mass index is 22.68 kg/(m^2).    Intake/Output Summary (Last 24 hours) at 11/16/15 1644  Last data filed at 11/16/15 1200   Gross per 24 hour   Intake    780 ml   Output    300 ml   Net  480 ml     General: awake, alert, oriented x 3;  HEENT: difficulty with turning head towards his left 2/2 to pain.    Cardiovascular: regular rate and rhythm, no murmurs, rubs or gallops  Lungs: clear to auscultation bilaterally, without wheezing, rhonchi, or rales  Abdomen: soft, non-tender, non-distended; no palpable masses, no hepatosplenomegaly, normoactive bowel sounds, no rebound or guarding  Extremities: no clubbing, cyanosis, or edema  MSK: L knee with full ROM, R knee with +TTP (medially), effusion improved, ROM limited  Neuro: CN grossly intact, speech fluent, follows commands, 5/5 b/l UE and LE strength     Meds:     Medications were reviewed:    Labs:     Recent Labs      11/16/15   0906  11/15/15   0341   WBC  12.40*  14.26*   HGB  13.4  13.1   HEMATOCRIT  39.3*  39.7*   PLATELETS  330  287       Recent Labs      11/15/15   0341  11/14/15   1332   SODIUM  136  138   POTASSIUM  4.1  4.3   CHLORIDE  100  102   CO2  24  25   BUN  16.0  16.0   CREATININE  0.8  0.9   GLUCOSE  106*  175*   CALCIUM  8.8  9.1       No results for input(s): AST,  ALT, ALKPHOS, PROT, ALB in the last 72 hours.    No results for input(s): PTT, PT, INR in the last 72 hours.    Imaging personally reviewed, including: No results found.            Case discussed with: Patient, RN, Family         Signed by: Les Pou, MD

## 2015-11-16 NOTE — PT Progress Note (Signed)
Physical Therapy Note  [facility]   Physical Therapy Treatment  Patient:  Matthew [name] MRN#:  [id number]  Unit: [facility] TOWER 8  Bed: F830/F830.01    Discharge Recommendations:   D/C Recommendations: SNF   DME Recommendations: Front wheel walker, [facility], Hospital bed (caregiver)     If SNF recommended discharge disposition is not available, patient will need 24/7 supervision and hands on caregiver assist for ADls and mobility, DME as listed and HHPT.    Assessment:   Pt in bed, rotated to R, note that pt cannot move head to turn and look at PT on L. Pt reports "this is new today. I can't turn my head." Dependent to max A to transfer supine to sit EOB. In sitting still cannot turn head/neck to L, PROM to neutral only. Standing trial to RW from elevated bed max A, results in pt screaming with R LE WB and controlled placement onto bed BW, R leg extended. PROM to R knee -10 extension, AROM -30. Pt with intact AROM B UE. Can scoot laterally SBA. Max A to transfer supine BTB. Asks for drink, has pain with swallow. RN alerted and paged attending MD to report new findings with c-spine. Assessed pt during lunch with soft foods to ensure not choking. Discussed case with RN, will need medical reassessment.  Assessment: Decreased LE strength, Decreased LE ROM, Decreased endurance/activity tolerance, Impaired motor control, Decreased functional mobility, Decreased balance, Gait impairment     Prognosis: Good, With continued PT status post acute discharge  Risks/Benefits/POC Discussed with Pt/Family: With patient/family  Patient left without needs and call bell within reach. RN notified of session outcome.     Treatment Activities: there ex, transfers, pressure relief.    Educated the patient to role of physical therapy, plan of care, goals of therapy and safety with mobility and ADLs.    Plan:   Treatment/Interventions: Investment banker, operational, Stair training, Functional transfer training,  Patient/family training, Equipment eval/education, Bed mobility, Compensatory technique education        PT Frequency: 4-5x/wk     Continue plan of care.       Precautions and Contraindications:   Precautions  Weight Bearing Status: no restrictions  Back Brace Applied: no (none indicated)  Spinal Precautions: no bending;no twisting;no lifting  Other Precautions: falls, pain    Updated Medical Status/Imaging/Labs: MRI R knee pending, will need c-spine assessment    Subjective: "It hurts to swallow."        Pain Assessment  Pain Assessment: Numeric Scale (0-10)  Pain Score: 8-severe pain (c-spine, cannot turn head, paged attending, RN notified)  Pain Intervention(s): Repositioned    Patient's medical condition is appropriate for Physical Therapy intervention at this time.  Patient is agreeable to participation in the therapy session. Nursing clears patient for therapy.    Objective:   Observation of Patient/Vital Signs:  Patient is in bed with SCD's in place.    Cognition/Neuro Status  Arousal/Alertness: Appropriate responses to stimuli  Attention Span: Appears intact  Orientation Level: Oriented X4  Memory: Appears intact  Following Commands: Follows all commands and directions without difficulty  Safety Awareness: independent  Insights: Fully aware of deficits            Functional Mobility:  Rolling: Maximal Assist  Supine to Sit: Maximal Assist;Increased Time;Increased Effort  Scooting to EOB: Stand by Assist  Sit to Supine: Maximal Assist  Sit to Stand: Maximal Assist;Increased Time;Increased Effort;bed elevated (pt screamed when trying to  WB R LE and fell BW to bed with P)  Transfers  Bed to Chair: Unable to assess (Comment) (pt unable to WB, unable to turn head today)    Ambulation:  Ambulation: Unable to assess (Comment) (unable to WB at all R LE)    Therapeutic Exercise  Manual Stretch: RLE (gentle ROM)  Heelslides: 10  Glute Sets: 10  Hip Flexion: 10  Knee AROM : 10  Ankle Pumps: 10     Neuro Re-Ed  Sitting  Balance: stand by assist (head rotated to R, trunk to L, SB to R)  Standing Balance: with support;maximal assist         Patient Participation: limited  Patient Endurance: limited    Patient left with call bell within reach, all needs met, SCDs on L leg, fall mat in place, bed alarm on,  and all questions answered. RN notified of session outcome and patient response.     Goals:  Goals  Goal Formulation: With patient/family  Time for Goal Acheivement: 3 visits  Pt Will Go Supine To Sit: with stand by assist, to maximize functional mobility and independence (via log roll)  Pt Will Perform Sit To Supine: with stand by assist, with log roll, to maximize functional mobility and independence  Pt Will Transfer Bed/Chair: with rolling walker, with supervision, to maximize functional mobility and independence  Pt Will Ambulate: 151-200 feet, with rolling walker, with supervision, to maximize functional mobility and independence  Pt Will Go Up / Down Stairs: 3-5 stairs, with contact guard assist, With rail, With [facility], to maximize functional mobility and independence    Time of Treatment  PT Received On: 11/16/15  Start Time: 1200  Stop Time: 1225  Time Calculation (min): 25 min  Treatment # 3 out of 3 visits  Jake Shark, DPT 11/16/2015 12:41 PM

## 2015-11-16 NOTE — Plan of Care (Signed)
Problem: Safety  Goal: Patient will be free from injury during hospitalization  Outcome: Progressing  Pt remain free from injury and fall. Safety precautions maintained, call light within reach, fall mats in place. Assisted with all his needs, hourly rounding maintained. Will continue to monitor.     Problem: Pain  Goal: Patient's pain/discomfort is manageable  Outcome: Progressing  Pt c/o of right knee and back pain. Pain controlled with Dilaudid 2 mg po and MS Contin scheduled. Denies sob, or chest pain. Will continue to monitor.     Comments:   Pt received in bed awake, and alert. Breathing regular, easy and unlabored. Surgical dressing cdi. NVI. Denies nausea/vomiting, active normal bowel sounds, passing flatus. Voiding well, tolerating po intake. SCD's on, will continue to monitor.

## 2015-11-16 NOTE — Progress Notes (Addendum)
PROGRESS NOTE    Date Time: 11/16/2015 10:21 AM  Patient Name: Matthew [name] D  Attending Physician: Les Pou, MD    Assessment:   Stable.    Plan:   MRI right knee  PT/OT for ambulation and mobilization  Subjective:   Expected incisional pain.  Denies nausea - regular diet. Continues to have right knee pain. Aspiration done by Alice Rieger PA  Yesterday. Synovial fluid WBC 19750 and RBC 32000.  Slow to ambulate and unable to bear weight on right leg.   Dressing clean, dry and intact.     Medications:     Current Facility-Administered Medications   Medication Dose Route Frequency   . atorvastatin  10 mg Oral QHS   . lisinopril-amLODIPine 10-5 combo dose   Oral Daily   . morphine  15 mg Oral Q8H SCH   . pantoprazole  40 mg Oral Daily   . polyethylene glycol  17 g Oral Daily   . pramipexole  0.25 mg Oral QHS   . senna-docusate  1 tablet Oral BID   . tamsulosin  0.4 mg Oral QD after dinner       Physical Exam:     Filed Vitals:    11/16/15 0741   BP: 145/77   Pulse: 112   Temp: 99.8 F (37.7 C)   Resp: 18   SpO2: 96%         Intake/Output Summary (Last 24 hours) at 11/16/15 1021  Last data filed at 11/16/15 0348   Gross per 24 hour   Intake    480 ml   Output    500 ml   Net    -20 ml       Drains:         [REMOVED] Closed/Suction Drain Posterior Back Bulb 10 Fr. (Removed)   Removed 11/12/15 0900   Site Description Unable to view 11/10/2015 10:30 PM   Dressing Status Clean;Dry;Intact 11/10/2015 10:30 PM   Drainage Appearance Serosanguineous 11/10/2015 10:30 PM   Status To bulb suction 11/10/2015 10:30 PM   Output (mL) 0 mL 11/12/2015  7:04 AM   Number of days:2       [REMOVED] Urethral Catheter Non-latex 8 Fr. (Removed)   Removed 11/10/15 1903   Number of days:0       [REMOVED] Urethral Catheter Coude 16 Fr. (Removed)   Removed 11/13/15    Catheter necessity reviewed? 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

## 2015-11-17 ENCOUNTER — Other Ambulatory Visit: Payer: Medicare Other

## 2015-11-17 ENCOUNTER — Inpatient Hospital Stay: Payer: Medicare Other

## 2015-11-17 LAB — BASIC METABOLIC PANEL
BUN: 13 mg/dL (ref 9.0–28.0)
CO2: 25 mEq/L (ref 21–30)
Calcium: 8.9 mg/dL (ref 7.9–10.2)
Chloride: 100 mEq/L (ref 100–111)
Creatinine: 0.8 mg/dL (ref 0.5–1.5)
Glucose: 176 mg/dL — ABNORMAL HIGH (ref 70–100)
Potassium: 3.8 mEq/L (ref 3.5–5.3)
Sodium: 136 mEq/L (ref 135–146)

## 2015-11-17 LAB — CBC AND DIFFERENTIAL
Basophils Absolute Automated: 0.01 10*3/uL (ref 0.00–0.20)
Basophils Automated: 0 %
Eosinophils Absolute Automated: 0.02 10*3/uL (ref 0.00–0.70)
Eosinophils Automated: 0 %
Hematocrit: 39.7 % — ABNORMAL LOW (ref 42.0–52.0)
Hgb: 13.1 g/dL (ref 13.0–17.0)
Immature Granulocytes Absolute: 0.1 10*3/uL — ABNORMAL HIGH
Immature Granulocytes: 1 %
Lymphocytes Absolute Automated: 1.13 10*3/uL (ref 0.50–4.40)
Lymphocytes Automated: 10 %
MCH: 30.9 pg (ref 28.0–32.0)
MCHC: 33 g/dL (ref 32.0–36.0)
MCV: 93.6 fL (ref 80.0–100.0)
MPV: 10.4 fL (ref 9.4–12.3)
Monocytes Absolute Automated: 1.38 10*3/uL — ABNORMAL HIGH (ref 0.00–1.20)
Monocytes: 12 %
Neutrophils Absolute: 8.53 10*3/uL — ABNORMAL HIGH (ref 1.80–8.10)
Neutrophils: 76 %
Nucleated RBC: 0 /100 WBC (ref 0–1)
Platelets: 362 10*3/uL (ref 140–400)
RBC: 4.24 10*6/uL — ABNORMAL LOW (ref 4.70–6.00)
RDW: 12 % (ref 12–15)
WBC: 11.17 10*3/uL — ABNORMAL HIGH (ref 3.50–10.80)

## 2015-11-17 LAB — GFR: EGFR: >60

## 2015-11-17 LAB — HEMOLYSIS INDEX: Hemolysis Index: 13 (ref 0–18)

## 2015-11-17 NOTE — Progress Notes (Signed)
[facility]   Occupational Therapy Cancellation Note      Patient:  Matthew [name] MRN#:  [id number]  Unit:  [facility] TOWER 8 Room/Bed:  F830/F830.01    11/17/2015  Time: 1540      Patient not seen for occupational therapy. 2 attempts made- 1st attempt off the floor for MRI and 2nd attempt on [facility] with tech and refused OT . Re-attempt as able at a later date.    Tora Perches OTR/L  Pager [phone]

## 2015-11-17 NOTE — Plan of Care (Addendum)
Problem: Safety  Goal: Patient will be free from injury during hospitalization  Outcome: Progressing  Pt is aware to call for assistant, call bell within reach, round on pt    Comments:   Pt confuse sometimes . Complained pain, receiving pain med with relief.

## 2015-11-17 NOTE — Plan of Care (Signed)
Problem: Safety  Goal: Patient will be free from injury during hospitalization  Outcome: Progressing  Pt alert and oriented, confused at times. Safety precautions maintained, call light and all his belongings at bedside, bed alarm on, fall mats in place. Assisted with all his needs.     Problem: Pain  Goal: Patient's pain/discomfort is manageable  Outcome: Progressing  Pt c/o of severe neck and shoulder pain. Pain controlled with PRN medications as ordered. Denies sob, chest pain, nausea and vomiting. No distress noted. Will continue to monitor.     Comments:   VSS, remain afebrile, surgical dressing cdi, encouraged to use IS. Voiding well. Tolerating po intake. NVI, SCD's on. Hourly rounding done. Will continue to monitor.

## 2015-11-17 NOTE — Progress Notes (Signed)
PROGRESS NOTE    Date Time: 11/17/2015 8:43 AM  Patient Name: Matthew [name] D  Attending Physician: Les Pou, MD    Assessment:   POD#7 Lumbar fusion surgery  Neck and left arm pain  Right knee pain    Plan:   PT/OT for ambulation and mobilization  MRI of cervical spine and right knee  Subjective:   Expected incisional pain, neck and left arm pain, right knee has decreased in pain but no weight bearing yet.  Dressing clean, dry and intact.  Yesterday pt states he had an episode of confusion - alert and oriented x3 now. Awaiting MRIs.  Plan for D/C to [facility] when results are available and able to ambulate.       Medications:     Current Facility-Administered Medications   Medication Dose Route Frequency   . atorvastatin  10 mg Oral QHS   . lisinopril-amLODIPine 10-5 combo dose   Oral Daily   . morphine  15 mg Oral Q8H SCH   . pantoprazole  40 mg Oral Daily   . polyethylene glycol  17 g Oral Daily   . pramipexole  0.25 mg Oral QHS   . senna-docusate  1 tablet Oral BID   . tamsulosin  0.4 mg Oral QD after dinner       Physical Exam:     Filed Vitals:    11/17/15 0649   BP: 155/74   Pulse: 111   Temp: 98.5 F (36.9 C)   Resp: 18   SpO2: 96%         Intake/Output Summary (Last 24 hours) at 11/17/15 0843  Last data filed at 11/17/15 0630   Gross per 24 hour   Intake    980 ml   Output    750 ml   Net    230 ml       Drains:         [REMOVED] Closed/Suction Drain Posterior Back Bulb 10 Fr. (Removed)   Removed 11/12/15 0900   Site Description Unable to view 11/10/2015 10:30 PM   Dressing Status Clean;Dry;Intact 11/10/2015 10:30 PM   Drainage Appearance Serosanguineous 11/10/2015 10:30 PM   Status To bulb suction 11/10/2015 10:30 PM   Output (mL) 0 mL 11/12/2015  7:04 AM   Number of days:2       [REMOVED] Urethral Catheter Non-latex 8 Fr. (Removed)   Removed 11/10/15 1903   Number of days:0       [REMOVED] Urethral Catheter Coude 16 Fr. (Removed)   Removed 11/13/15    Catheter necessity reviewed? 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

## 2015-11-17 NOTE — Plan of Care (Signed)
Problem: Safety  Goal: Patient will be free from injury during hospitalization  Outcome: Progressing  Pt incontinence care provided in AM.  Continent during day and able to void in urinal  BM today on bedside commode. Cervical MRI today. Daughter present and involved with care.      Problem: Pain  Goal: Patient's pain/discomfort is manageable  Outcome: Progressing  Patient has neck pain and knee pain.  2mg  PO dilaudid provided prn.  Pt repositioned as needed. Patient has little appetite but is tolerating Ensure.

## 2015-11-17 NOTE — Progress Notes (Signed)
MEDICINE CONSULT FOLLOW-UP    Date Time: 11/17/2015 3:54 PM  Patient Name: Matthew [name]  Attending Physician: Les Pou, MD  Consulting Physician: Les Pou, MD      Assessment:     Patient Active Problem List    Diagnosis Date Noted   . Acute pain of right knee    . Lumbar stenosis 11/11/2015   . 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 Hypertension 11/11/2015   . BPH (benign prostatic hypertrophy) 11/11/2015   . 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 Fevers- Afebrile x 24h. Right Knee effusion with limited range of motion, fevers, and leukocytosis  - Orthopedics performed joint aspiration 12/6: negative for crystals, WBC 20K, Fluid Cx NGTD  - Orthopedics following  - Knee x-rays show significant effusion possibly due to inflammatory arthropathy, duplex of RLE negative for DVT    - Surgical site appears C/[name]/I  - Blood cx obtained, UA shows hematuria (possibly due to traumatic cath) and not consistent with UTI, CXR shows developing right basal infiltrate however patient without clinical signs/symptoms of PNA (no cough, sputum production, shortness of breath)   -PT/OT  -Still requiring IV Dilaudid for pain control    - MRI Right Knee pending per Ortho recs (scheduled for 12/9)  -Blood Cx NGTD 12/5    2. Nausea/Vomiting-  Resolved. was likely due to recent surgery, acid reflux, and ileus noted on x-ray   - Restarted on home PPI   - Abdominal x-rays show ileus, no evidence of obstruction  - LFTs and Lipase without significant acute abnormality  - Already ruled out for ACS with serial negative troponins   - Bowel Regimen     #Neck Pain: Hx of Cervical fusion. Noted worsening neck pain and described "popping sensation"  MRI c spine 12/8 no acute findings. Will continue symptomatic treatement.    #Confusion: Improved.  Likely delirium from fevers/ meds.    Afebrile overnight. Will f/u MRI Knee 12/9      HPI/Subjective:  NAE, knee mobility improved. Still c/o of  neck pain.     Review of Systems:   Review of Systems - Negative except as noted above in HPI        Physical Exam:     Patient Vitals for the past 24 hrs:   BP Temp Temp src Pulse Resp SpO2   11/17/15 0649 155/74 mmHg 98.5 F (36.9 C) Oral (!) 111 18 96 %   11/16/15 2302 149/77 mmHg 98.5 F (36.9 C) Oral (!) 113 18 97 %   11/16/15 1831 147/68 mmHg 99 F (37.2 C) Oral (!) 103 18 98 %   11/16/15 1700 155/63 mmHg 98.1 F (36.7 C) Oral (!) 106 18 98 %     Body mass index is 22.68 kg/(m^2).    Intake/Output Summary (Last 24 hours) at 11/17/15 1554  Last data filed at 11/17/15 1400   Gross per 24 hour   Intake    680 ml   Output   1000 ml   Net   -320 ml     General: awake, alert, oriented x 3;  HEENT: difficulty with turning head towards his left 2/2 to pain.  TTP along spinal process. No erytherma/warmth fluctuance.    Cardiovascular: regular rate and rhythm, no murmurs, rubs or gallops  Lungs: clear to auscultation bilaterally, without wheezing, rhonchi, or rales  Abdomen: soft, non-tender, non-distended; no palpable masses, no hepatosplenomegaly, normoactive bowel sounds, no rebound or  guarding  Extremities: no clubbing, cyanosis, or edema  MSK: L knee with full ROM, R knee improved ROM (limited but improved), Effusion improved.   Neuro: CN grossly intact, speech fluent, follows commands, 5/5 b/l UE and LE strength     Meds:     Medications were reviewed:    Labs:     Recent Labs      11/17/15   0400  11/16/15   0906   WBC  11.17*  12.40*   HGB  13.1  13.4   HEMATOCRIT  39.7*  39.3*   PLATELETS  362  330       Recent Labs      11/17/15   0400  11/15/15   0341   SODIUM  136  136   POTASSIUM  3.8  4.1   CHLORIDE  100  100   CO2  25  24   BUN  13.0  16.0   CREATININE  0.8  0.8   GLUCOSE  176*  106*   CALCIUM  8.9  8.8       No results for input(s): AST, ALT, ALKPHOS, PROT, ALB in the last 72 hours.    No results for input(s): PTT, PT, INR in the last 72 hours.    Imaging personally reviewed, including: Mri Cervical  Spine Wo Contrast    11/17/2015   Postoperative and advanced degenerative changes in the cervical spine. Terrilee Croak, MD 11/17/2015 2:14 PM               Case discussed with: Patient, RN, Family         Signed by: Les Pou, MD

## 2015-11-17 NOTE — Progress Notes (Signed)
Patient has extremely stiff neck and must use his own 2 hands to move it. Patient states he is now having involuntary twitching of body (not just restless legs).  He also states heightened sense of smell which is interfering with his appetite.  Cervical MRI completed and knee MRI to be completed tomorrow. Last BM one week ago. Dilaudid 2mg PO prn provided as well as Miralax.

## 2015-11-18 ENCOUNTER — Other Ambulatory Visit: Payer: Medicare Other

## 2015-11-18 ENCOUNTER — Inpatient Hospital Stay: Payer: Medicare Other

## 2015-11-18 ENCOUNTER — Inpatient Hospital Stay
Admission: RE | Admit: 2015-11-18 | Discharge: 2015-11-25 | DRG: 561 | Disposition: A | Discharge status: Home Health | Payer: Medicare Other | Source: Other Acute Inpatient Hospital | Attending: Hospice and Palliative Medicine | Admitting: Hospice and Palliative Medicine

## 2015-11-18 ENCOUNTER — Inpatient Hospital Stay: Payer: Medicare Other | Admitting: Hospice and Palliative Medicine

## 2015-11-18 DIAGNOSIS — Z9889 Other specified postprocedural states: Secondary | ICD-10-CM | POA: Insufficient documentation

## 2015-11-18 DIAGNOSIS — R5381 Other malaise: Secondary | ICD-10-CM | POA: Diagnosis present

## 2015-11-18 DIAGNOSIS — I951 Orthostatic hypotension: Secondary | ICD-10-CM | POA: Diagnosis not present

## 2015-11-18 DIAGNOSIS — I10 Essential (primary) hypertension: Secondary | ICD-10-CM | POA: Diagnosis present

## 2015-11-18 DIAGNOSIS — Z4789 Encounter for other orthopedic aftercare: Principal | ICD-10-CM

## 2015-11-18 DIAGNOSIS — D649 Anemia, unspecified: Secondary | ICD-10-CM | POA: Diagnosis present

## 2015-11-18 DIAGNOSIS — Z981 Arthrodesis status: Secondary | ICD-10-CM

## 2015-11-18 DIAGNOSIS — Z85828 Personal history of other malignant neoplasm of skin: Secondary | ICD-10-CM

## 2015-11-18 DIAGNOSIS — L299 Pruritus, unspecified: Secondary | ICD-10-CM | POA: Diagnosis present

## 2015-11-18 DIAGNOSIS — M1711 Unilateral primary osteoarthritis, right knee: Secondary | ICD-10-CM | POA: Diagnosis present

## 2015-11-18 DIAGNOSIS — K219 Gastro-esophageal reflux disease without esophagitis: Secondary | ICD-10-CM | POA: Diagnosis present

## 2015-11-18 MED ORDER — HYDROMORPHONE HCL 2 MG PO TABS
2.0000 mg | ORAL_TABLET | ORAL | Status: DC | PRN
Start: 2015-11-18 — End: 2015-11-25
  Administered 2015-11-18 – 2015-11-25 (×28): 2 mg via ORAL
  Filled 2015-11-18 (×28): qty 1

## 2015-11-18 MED ORDER — ATORVASTATIN CALCIUM 10 MG PO TABS
10.0000 mg | ORAL_TABLET | Freq: Every evening | ORAL | Status: DC
Start: 2015-11-18 — End: 2015-11-25
  Administered 2015-11-18 – 2015-11-24 (×7): 10 mg via ORAL
  Filled 2015-11-18 (×7): qty 1

## 2015-11-18 MED ORDER — TAB-A-VITE/BETA CAROTENE PO TABS
1.0000 | ORAL_TABLET | Freq: Every day | ORAL | Status: DC
Start: 2015-11-19 — End: 2015-11-25
  Administered 2015-11-19 – 2015-11-25 (×7): 1 via ORAL
  Filled 2015-11-18 (×7): qty 1

## 2015-11-18 MED ORDER — DEXTROMETHORPHAN-GUAIFENESIN ER 30-600 MG PO TB12
1.0000 | ORAL_TABLET | Freq: Two times a day (BID) | ORAL | Status: DC | PRN
Start: 2015-11-18 — End: 2015-11-25

## 2015-11-18 MED ORDER — AMLODIPINE BESYLATE 5 MG PO TABS
ORAL_TABLET | Freq: Every day | ORAL | Status: DC
Start: 2015-11-19 — End: 2015-11-19
  Filled 2015-11-18 (×2): qty 1

## 2015-11-18 MED ORDER — HYDROMORPHONE HCL 2 MG PO TABS
2.0000 mg | ORAL_TABLET | Freq: Three times a day (TID) | ORAL | Status: DC | PRN
Start: 2015-11-18 — End: 2015-11-25
  Administered 2015-11-18: 2 mg via ORAL
  Filled 2015-11-18: qty 1

## 2015-11-18 MED ORDER — PRAMIPEXOLE DIHYDROCHLORIDE 0.25 MG PO TABS
0.2500 mg | ORAL_TABLET | Freq: Two times a day (BID) | ORAL | Status: DC
Start: 2015-11-18 — End: 2015-11-25
  Administered 2015-11-18 – 2015-11-25 (×14): 0.25 mg via ORAL
  Filled 2015-11-18 (×15): qty 1

## 2015-11-18 MED ORDER — PANTOPRAZOLE SODIUM 40 MG PO TBEC
40.0000 mg | DELAYED_RELEASE_TABLET | Freq: Every day | ORAL | Status: DC
Start: 2015-11-19 — End: 2015-11-25
  Administered 2015-11-19 – 2015-11-25 (×7): 40 mg via ORAL
  Filled 2015-11-18 (×7): qty 1

## 2015-11-18 MED ORDER — MORPHINE SULFATE ER 15 MG PO TBCR
15.0000 mg | EXTENDED_RELEASE_TABLET | Freq: Three times a day (TID) | ORAL | Status: DC
Start: 2015-11-18 — End: 2015-11-25
  Administered 2015-11-18 – 2015-11-25 (×21): 15 mg via ORAL
  Filled 2015-11-18 (×21): qty 1

## 2015-11-18 MED ORDER — DOCUSATE SODIUM 100 MG PO CAPS
100.0000 mg | ORAL_CAPSULE | Freq: Two times a day (BID) | ORAL | Status: DC
Start: 2015-11-18 — End: 2015-11-25
  Administered 2015-11-19 – 2015-11-25 (×11): 100 mg via ORAL
  Filled 2015-11-18 (×14): qty 1

## 2015-11-18 MED ORDER — CYCLOBENZAPRINE HCL 10 MG PO TABS
5.0000 mg | ORAL_TABLET | Freq: Three times a day (TID) | ORAL | Status: DC | PRN
Start: 2015-11-18 — End: 2015-11-25
  Administered 2015-11-18 – 2015-11-24 (×3): 5 mg via ORAL
  Filled 2015-11-18 (×3): qty 1

## 2015-11-18 MED ORDER — ALPRAZOLAM 0.5 MG PO TABS
0.5000 mg | ORAL_TABLET | Freq: Two times a day (BID) | ORAL | Status: DC | PRN
Start: 2015-11-18 — End: 2015-11-25
  Administered 2015-11-18 – 2015-11-20 (×2): 0.5 mg via ORAL
  Filled 2015-11-18 (×2): qty 1

## 2015-11-18 NOTE — OT Progress Note (Signed)
Occupational Therapy Note    [facility]   Occupational Therapy Treatment     Patient: Matthew [name]    MRN#: [id number]   Unit: [facility] TOWER 8  Bed: F830/F830.01      Discharge Recommendations:   Discharge Recommendation: Acute Rehab   DME Recommended for Discharge:  (TBD at Rehab)    If Acute Rehab recommended discharge disposition is not available, patient will need SNF.    Assessment:   Pt continues to benefit from OT services to maximize independence and safety with ADLs and functional mobility.      Pt feeling much better today, moving better with RW. Pt completes LB ADLs with Min A. He is appropriate for Acute Rehab, can tolerate 3 hours of therapy/day.    Patient left without needs and call bell within reach. RN notified of session outcome.    Treatment Activities: ADLs, functional mobility    Educated the patient to role of occupational therapy, plan of care, goals of therapy and safety with mobility and ADLs, spine precautions.    Plan:   OT Frequency Recommended: 3-4x/wk     Goal Formulation: Patient  OT Plan  Risks/Benefits/POC Discussed with Pt/Family: With patient  Treatment Interventions: ADL retraining, Functional transfer training, Compensatory technique education, Equipment eval/education, Patient/Family training  Discharge Recommendation: Acute Rehab  DME Recommended for Discharge:  (TBD at Rehab)  OT Frequency Recommended: 3-4x/wk  OT - Next Visit Recommendation: 11/21/15    Continue plan of care.         Precautions and Contraindications:   Precautions  Weight Bearing Status: no restrictions  Precaution Instructions Given to Patient: Yes  Spinal Precautions: no bending, no twisting, no lifting  Other Precautions: Falls    Updated Medical Status/Imaging/Labs:  MRI Cervical Spine WO Contrast   IMPRESSION:    Postoperative and advanced degenerative changes in the  cervical spine.      Subjective: "I can move now"   Patient's medical condition is appropriate for  Occupational Therapy intervention at this time.  Patient is agreeable to participation in the therapy session. Nursing clears patient for therapy.    Pain:  Scale: 6/10  Location: Back, R knee  Intervention: Pain medication, repositioning      Objective:    Patient is in bed with IV, SCDs in place.    Cognition:  A&OX3  Follows simple directions without difficulty    Functional Mobility:  Rolling: CGA  Supine to Sit: CGA  Sit to Stand: CGA/Min A w/ RW  Transfers: CGA/Min A w/ RW  Functional Ambulation: CGA/Min A w/ RW    Balance:  Static Sitting: Good  Dynamic Sitting: Good  Static Standing: Fair+ w/ RW  Dynamic Standing: Fair w/ RW    Self Care and Home Management:  Eating: Indep  Grooming: Indep, seated  Bathing: Indep UB     Min A LB  UE Dressing: Indep  LE Dressing: Min A   Toileting: Min A      Participation: Good  Endurance: Good    Therapeutic Exercises:  Incorporated into therapy session    Treatment Activities: ADLs, functional mobility    Educated the patient to role of occupational therapy, plan of care, goals of therapy and safety with mobility and ADLs, spine precautions.      Patient left with call bell within reach, all needs met and all questions answered, RN notified of session outcome and patient response.      Goals:After session: SCDs  on, fall mat in place, bed alarm N/A, chair alarm ON      Time For Goal Achievement: 5 visits  ADL Goals  Patient will dress lower body: Stand by Assist  Patient will toilet: Independent  Mobility and Transfer Goals  Pt will perform functional transfers: Stand by Assist, with rolling walker        Executive Fucntion Goals  Pt will follow spine precautions:  (consistently)                    Time of Treatment  OT Received On: 11/18/15  Start Time: 1000  Stop Time: 1045  Time Calculation (min): 45 min  Treatment # 3 of 5    Nigel Berthold, [place] Pager# [phone]

## 2015-11-18 NOTE — Plan of Care (Signed)
Problem: Safety  Goal: Patient will be free from injury during hospitalization  Outcome: Completed Date Met:  11/18/15  Patient free of fall. Patient ambulated with 2 person assistance. Patient voided. Patient will be transferred to [facility] rehab at 1 pm via ambulance. Report given to Tigist RN. Neurovascular status intact. No nausea and vomiting.     Problem: Pain  Goal: Patient's pain/discomfort is manageable  Outcome: Completed Date Met:  11/18/15  Patient verbalized moderate to severe pain. Dilaudid Po administered. Pain controlled.

## 2015-11-18 NOTE — Progress Notes (Signed)
PROGRESS NOTE    Date Time: 11/18/2015 8:30 AM  Patient Name: Matthew [name]  Attending Physician: Les Pou, MD    Assessment:   POD #8     Plan:   [name]/C to [facility]. [facility] when cleared by Medicine and Physical Therapy, and bed is available.  Follow up in office in two weeks.   PT/OT for ambulation and mobilization  Subjective:   No C/O pain. Alert and oriented X3.  Sitting up on his own and expecting to ambulate today with PT/OT.  Dressing changed, wound clean.    Medications:     Current Facility-Administered Medications   Medication Dose Route Frequency   . atorvastatin  10 mg Oral QHS   . lisinopril-amLODIPine 10-5 combo dose   Oral Daily   . morphine  15 mg Oral Q8H SCH   . pantoprazole  40 mg Oral Daily   . polyethylene glycol  17 g Oral Daily   . pramipexole  0.25 mg Oral QHS   . senna-docusate  1 tablet Oral BID   . tamsulosin  0.4 mg Oral QD after dinner       Physical Exam:     Filed Vitals:    11/18/15 0703   BP: 134/69   Pulse: 109   Temp: 97.4 F (36.3 C)   Resp: 18   SpO2: 96%         Intake/Output Summary (Last 24 hours) at 11/18/15 0830  Last data filed at 11/17/15 2332   Gross per 24 hour   Intake      0 ml   Output    500 ml   Net   -500 ml       Drains:         [REMOVED] Closed/Suction Drain Posterior Back Bulb 10 Fr. (Removed)   Removed 11/12/15 0900   Site Description Unable to view 11/10/2015 10:30 PM   Dressing Status Clean;Dry;Intact 11/10/2015 10:30 PM   Drainage Appearance Serosanguineous 11/10/2015 10:30 PM   Status To bulb suction 11/10/2015 10:30 PM   Output (mL) 0 mL 11/12/2015  7:04 AM   Number of days:2       [REMOVED] Urethral Catheter Non-latex 8 Fr. (Removed)   Removed 11/10/15 1903   Number of days:0       [REMOVED] Urethral Catheter Coude 16 Fr. (Removed)   Removed 11/13/15    Catheter necessity reviewed? 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

## 2015-11-18 NOTE — Rehab Liaison Note ([facility]) (Signed)
Matthew [name]  MRN: [id number]  Account: [id number]  Session Start: 11/18/2015 12:00:00 AM  Session Stop: 11/18/2015 12:00:00 AM    Clinical Liaison  Inpatient Rehabilitation Pre Admission Screen    Medical Diagnosis:  Degenerative lumbar spinal stenosis, Spondylolisthesis of  lumbar region  Rehab Diagnosis: Degenerative lumbar spinal stenosis, Spondylolisthesis of  lumbar region  Probable Impairment Group Code: 08.9 Other Orthopedic  Demographics:   Age: 62Y   Gender: Male   Name, phone and relationship of contact person:  Contact Name: Ian Malkin Vega-daughter  Phone:   [phone]  Relationship:   Child   Guardian/Power of Attorney:   It is unknown whether the patient has a Advertising account planner.   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 - does not remember type of skin ca  SURGICAL HISTORY:  Ercp, stent    2007  Suprapubic prostatectomy    2007  Cholecystectomy    1980  Vasectomy    1980's  Cervical fusion    1997  states has full ROM of neck  Appendectomy    1952  Tonsillectomy    Prior Level of Functioning:  Mobility:   Ambulates with assistive device FWW for the past 2 months for household  ambulation as back pain got worse, was previously was independent  Activities of Daily Living:  Independent with ADLs and IADLs  Cognition:  WNL  Communication:  WNL  Swallowing:  WNL    CARE Tool  Prior Functioning:  Self Care: Patient completed the activities by him/herself, with or without an  assistive device, with no assistance from a  helper.  Indoor Mobility: Patient completed the activities by him/herself, with or  without an assistive device, with no assistance from a helper.  Stairs: Patient completed the activities by him/herself, with or without an  assistive device, with no assistance from a helper.  Functional Cognition: Patient completed the activities by him/herself, with or  without an assistive device, with no assistance from a helper.  Prior Device Use: Walker  Health Conditions: Patient has had two or more falls, or a fall with injury, in  the past year. Patient has not had major surgery during the 100 days prior to  admission.    Social History:  Marital Status: divorced  Children: daughter Ian Malkin          Reside:  [place] in [place]  Employment Status:  retired  Facilities manager Activities/Hobbies:  Pre-hospital Living Environment: Pt lives alone in one story house with 2 STE.  He will be staying with daughter locally post acute rehab stay, she is in a 2  story home with 2 STE, once inside kitchen, dining room, living room, half bath,  then up 12 steps with railing to bedrooms and full bath.  Daughter will be home  to assist patient when he discharge from acute rehab.  Language:  English  Hand Dominance: Right.    The following information was gathered for consideration and maintenance in the  medical record to substantiate medical necessity for an IRF level of  care.    This assessment is being completed by Jeanella Craze , BSN, RN-BC . Patient is  currently at [facility] NT 8E room 8[address], [place], [place] Unit ph [phone].    The patient is being referred and recommended by their physician, Dr Amaryllis Dyke, to be assessed both medically and functionally in regard to their  premorbid functional capacity to determine whether they can benefit from a  rehabilitation level of care offered by our facility.    The following is information regarding the medical complexity and clinical risk  factors that needs to be considered for the  appropriate management of the  patient's care and recovery.  Acute Medical Conditions: Lumbar stenosis  Spondylolisthesis of lumbar region  Hypertension  BPH (benign prostatic hypertrophy)  Degenerative lumbar spinal stenosis  Ileus  Right knee pain  History of Present Illness:  74 y.o. male admitted to [facility] 11/10/15 with hx  chronic pancreatitis, BPH, HTN who had chest pain before lumbar laminectomy and  fusion on 12/1 by Dr. Marjo Bicker. He had an EKG during the episode that showed no ST  elevations or LBBB. He proceeded with surgery. He states the pain was sharp,  left lower chest / left upper quadrant, and lasted /R/2 minutes. He denies chest  pain, shortness of breath, but notes acid reflux. These symptoms are sudden  onset, moderate intensity, without alleviating factors.  Surgery 12/1:  Lumbosacral fusion  Chest Pain- Etiology unclear however resolved without intervention after 2  minutes and has not recurred   EKG without acute ischemic changes, troponin negative X 1, will trend to rule  out ACS  Does note history of acid reflux which may have contributed to chest pain v  atypical in etiology   CXR does not show infiltrate or other acute pathology, radiology read pending  No recurrence of chest pain after resolution.   Denies shortness of breath,  abdominal pain, nausea, vomiting, or reflux at this time.  States he is doing  well now.  Plan of care discussed with family at bedside.  12/3- Nausea/Vomiting- Likely due to recent surgery, acid reflux,  Restarted on  home PPI  12/4- Abdominal x-rays show ileus, no evidence of obstruction, will obtain  follow up per radiology recommendations, LFTs and Lipase without significant  acute abnormality, Already ruled out for ACS as above  11/14/15- Follow up abdominal x-rays shows mildly improved since the prior study  favoring a resolving ileus. Clear liquid diet.  Doppler to rule out DVT RLE for  R knee pain 10/10.  RESULTS:  No evidence of deep venous thrombosis  involving  the right  lower extremity.  Right Knee effusion with limited range of motion, fevers, and  leukocytosis, Orthopedics performed joint aspiration 12/6: negative for  crystals, WBC 20K, Fluid Cx NGTD,  Knee x-rays show significant effusion  possibly due to inflammatory arthropathy, Surgical site appears C/D/I  Blood cx obtained, UA shows hematuria (possibly due to traumatic cath) and not  consistent with UTI, CXR shows developing right basal infiltrate however patient  without clinical signs/symptoms of PNA (no cough, sputum production, shortness  of breath)  12/7-Patient complaining of worsening neck pain.  On eval by PT patient with  significant difficulty with passive ROM of Cervical neck.  Mild TTP along spiny  process.  MRI c spine pending  11/17/15-Neck Pain: Hx of Cervical fusion. MRI c spine 12/8 no acute findings.  Will continue symptomatic treatment.  Afebrile overnight.  12/9-Pt participating in therapy and will  benefit from acute rehab @ [facility].   Pt  afebrile, medically stable to transfer today.   Date of Onset: 11/10/15   Date Admitted to Acute:  11/10/15  Current Precautions:   Hypertension precautions.   Risk for falls.  Food Allergies  No known food allergies.  Medication Allergies   No known medication allergies.  Other Allergies Not applicable.    Present Systems Summary:  Vital signs: Maximum Temperature (last 48 hrs):  98.5 degrees.  Pulse:  109 beats per minute.  Respirations:  18 breaths per minute.  Blood Pressure:   134/69 mmHg.  Pulse Ox: 96  Height/weight:    5 ft 10 inches   158 lbs 1 oz  Hearing: No current issues  Vision: No current issues.  Other medical comments:  DIET:  Current Diet Texture: Regular diet.  Current Liquid Consistency: Thin liquids.  Type: Regular.  Bladder: Foley Size:  secondary to Hx of BPH  Bowel: Patient does not have an ostomy.  Date of Last Bowel Movement:  11/17/15 Patient is continent of bowel.  Integumentary:   Surgical Site. Back incision with  dsg-CDI  Cardiopulmonary:   Room Air.  Dialysis: Patient currently is not receiving dialysis.  Current Medication(s): 11/18/15   atorvastatin (LIPITOR) tablet 10 mg, PO, QHS  lisinopril-amLODIPine 10-5 combo dose, Rate, PO, Daily  morphine (MS CONTIN) 12 hr tablet 15 mg, PO, Q8H SCH  pantoprazole (PROTONIX) EC tablet 40 mg, PO, Daily  polyethylene glycol (MIRALAX) packet 17 g, PO, Daily  pramipexole (MIRAPEX) tablet 0.25 mg, PO, QHS  senna-docusate (PERICOLACE) 8.6-50 MG per tablet 1 tablet, PO, BID  tamsulosin (FLOMAX) capsule 0.4 mg, PO, QD after dinner  Current Alcohol Use:  Patient consumes alcohol. drinks 1 glass wine per night  Current Tobacco Use:  No, patient does not use tobacco  Current Drug Use: No, patient does not use recreational drugs.  Pain: Patient currently without complaints of pain.    Additional medical documentation contributing to the expected care of this  patient may be noted in the following areas:  Laboratory-Chemistry/Hematology: 11/17/2015 04:00  WBC: 11.17 (H)  Hemoglobin: 13.1  Hematocrit: 39.7 (L)  Platelet Count: 362  RBC: 4.24 (L)  MCV: 93.6  MCH, POC: 30.9  MCHC: 33.0  RDW: 12  MPV: 10.4  Neutrophils: 76  Lymphocytes Automated: 10  Monocytes: 12  Eosinophils Automated: 0  Basophils Automated: 0  Immature Granulocyte: 1  Nucleated RBC: 0  Neutrophils Absolute: 8.53 (H)  Abs Lymph Automated: 1.13  Abs Eos Automated: 0.02  Abs Mono Automated: 1.38 (H)  Absolute Baso Automated: 0.01  Absolute Immature Granulocyte: 0.10 (H)  Glucose: 176 (H)  BUN: 13.0  Creatinine: 0.8  Sodium: 136  Potassium: 3.8  Chloride: 100  CO2: 25  Calcium: 8.9  EGFR: >60.0   .  Cultures:   Blood: SOURCE: Blood, Venipuncture peripheral               COLLECTED:  11/14/15 13:32  ANTIBIOTICS AT COLL.:                                RECEIVED :  11/14/15 17:45  Culture Blood Aerobic and Anaerobic        PRELIM      11/15/15 18:21  11/15/15   No Growth after 1 day/s of incubation.   Sputum: SOURCE: Synovial Fluid Right  Knee  COLLECTED:  11/14/15 13:30  ANTIBIOTICS AT COLL.:                                RECEIVED :  11/14/15 19:44  Stain, Acid Fast                           FINAL       11/15/15 10:32  11/15/15   No Acid Fast Bacillus Seen  Culture Acid Fast Bacillus (AFB)           PENDING       Urine: SOURCE: Urine, Clean Catch                           COLLECTED:  11/14/15 13:32  ANTIBIOTICS AT COLL.:                                RECEIVED :  11/14/15 17:45  Culture Urine                              FINAL       11/15/15 15:08  11/15/15   No growth of >1,000 CFU/ML, No further work   SOURCE: Synovial Fluid right knee                    COLLECTED:  11/14/15 13:30  ANTIBIOTICS AT COLL.:                                RECEIVED :  11/14/15 19:44  Stain, Gram                                FINAL       11/14/15 21:58  11/14/15   Moderate WBCs             No organisms seen  Culture and Gram Stain, Aerobic, Body FluidPRELIM      11/15/15 19:34  11/15/15   Culture no growth to date. Final report to follow   SOURCE: Synovial Fluid right knee                    COLLECTED:  11/14/15 13:30  ANTIBIOTICS AT COLL.:                                RECEIVED :  11/14/15 19:44  Stain, Fungal                              FINAL       11/15/15 10:35  11/15/15   No Fungal or Yeast Elements Seen  Culture Fungus                             PENDING  Radiology:   Chest X-Ray: HISTORY: Chest pain.    COMPARISON: None.    FINDINGS: Portable chest x-ray shows clear lungs without focal  infiltrate or effusion. Heart size and pulmonary vasculature is within  normal limits. There is  no hilar or mediastinal prominence evident.  Postoperative hardware is present in the cervical spine.    IMPRESSION:   No acute disease.    Phillips Odor, MD  11/11/2015 3:02 PM   X-Ray: Study Result    CLINICAL HISTORY: Follow-up of ileus    COMPARISON: Abdominal x-ray dated 11/13/2015    FINDINGS:    There are prominent nondilated loops of small bowel  measuring up to 2.2  cm which appear mildly improved since the prior study favoring a  resolving ileus. There remains a small to moderate amount of stool which  appears to be decreased since the prior study.    There is no evidence of organomegaly.    Lumbar spinal fusion of L4-S1 is noted.    IMPRESSION:   Improving ileus.    Annamary Carolin, MD  11/14/2015 8:42 AM   History: Postop spine surgery with right lower extremity pain.    Duplex evaluation of the deep venous system of the right lower extremity  was performed for evaluation of pain.   The common femoral vein, femoral  vein and popliteal vein were visualized and appear unremarkable.  These  veins are compressible with no evidence of thrombus.  Doppler  demonstrates patency and normal directional and phasic flow.  Appropriate augmentation was observed.  The proximal deep femoral vein  is visualized and appears unremarkable.  The portions of the posterior  tibial and peroneal veins that were seen appear unremarkable with no  thrombus noted.  There is no indirect evidence of calf vein thrombus.    IMPRESSION:   No evidence of deep venous thrombosis involving the right  lower extremity  IVs:  IV Access: No IV access.    Is patient presently participating in rehabilitation? Yes  Adjustment to Present Illness: Patient is coping adequately.   Patient is accepting limitations adequately.   Patient's expectations are realistic.   Patient is motivated.   X-Ray: Knee pain question gout    Findings no prior. 3 views of the right knee demonstrating a significant  joint effusion without fluid level or dystrophic calcification. There is  generalized soft tissue swelling. There is no significant narrowing of  the joint compartments but there are small bony osteophytes. No  periarticular erosion but there are meniscal calcifications. No lytic or  blastic bone lesions.    IMPRESSION:  . Significant joint effusion with focal swelling. Findings  are nonspecific but could reflect  reactive effusion from an inflammatory  arthropathy or occult trauma.  2. There is no evidence of acute bone injury  3. Mild nonspecific degenerative joint changes. The meniscal  calcifications raising the concern for an underlying deposition disease    Marty Heck, MD  11/14/2015 2:13 PM  IVs:  IV Access: No IV access.    Is patient presently participating in rehabilitation? Yes  Adjustment to Present Illness: Patient is coping adequately.   Patient is accepting limitations adequately.   Patient's expectations are realistic.   Patient is motivated.  Activity Tolerance:  Good.  SPECIAL NEEDS: None.    Current Functional Status  Weight-bearing Status:   No Restrictions  Mobility: 11/16/15  Functional Mobility:  Rolling: Maximal Assist  Supine to Sit: Maximal Assist;Increased Time;Increased Effort  Scooting to EOB: Stand by Assist  Sit to Supine: Maximal Assist  Sit to Stand: Maximal Assist;Increased Time;Increased Effort;bed elevated (pt  screamed when trying to WB R LE and fell BW to bed with P)  Transfers  Bed to Chair:  Unable to assess (Comment) (pt unable to WB, unable to turn head  today)    Ambulation:  Ambulation: Unable to assess (Comment) (unable to WB at all R LE)  Activities of Daily Living: 11/17/15 per nursing:  Eating: Indep after set-up  Grooming: Indep washing face after setup    UE Dressing: Min A with hospital gownToileting:Mod Assist of 1 to [facility]  11/15/15  Functional Mobility:  Supine to Sit: NT  Sit to Stand: Max A w/ RW        1st attempt, pt sits without warning  Transfers: Mod/Max A w/ RW  Functional Ambulation: Mod/Max A w/ RW few steps chair to bed  Sit to Supine: Mod/Max A    Balance:  Static Sitting: Good/CGA  Dynamic Sitting: Fair+  Static Standing: Fair w/ RW  Dynamic Standing: Fair- w/ RW    Self Care and Home Management:  Eating: Indep after set-up  Grooming: Indep washing face with washcloth  Bathing: Min/Mod A UB  UE Dressing: Min A hospital gown  LE Dressing: Dep B socks  Toileting:  NT  Cognition: 11/18/15  A/T/Ox4, follows commands  Communication: 11/18/15  WNL  Swallowing:  11/18/15  WNL  Other Impairments: Gait abnormality Impaired sensation Lack of coordination    Patient is able to understand and make healthcare decisions  Yes  Payer Source: Level of care will be discussed with the following payer sources  if/when applicable.  Primary: Medicare A/T/B  Secondary: [employer]    Information and Case Discussion:   Rehabilitation risks/benefits were reviewed.  Patient/family/caregiver agrees to/accepts rehabilitation risks/benefits.   Rehab literature/brochure was provided to: Met with patient and daughter  11/11/15 .  Case will be discussed with Physician/Medical Director.        IRF Admission Approval/Non-Approval  Appropriateness for admission to the Inpatient Rehabilitation Facility:  The  patient's condition is sufficiently stable to allow active participation in an  intensive interdisciplinary inpatient rehabilitation program. The patient would  benefit from interdisciplinary inpatient rehabilitation provided by a physician,  rehab-focused nursing, and a minimum of two rehab therapies which will provide  specialized care for the following functional deficits:   Bladder Management.  Hydration  Leisure Skills  Metabolic Function  Mobility  Nutrition  Pain Management  Psychosocial  Risk of Infection  Safety Risk  Self Care Management  Skin Wound Management  Comorbid conditions present at pre-admission:  The interdisciplinary team will also manage the potential risks and  complications from the following comorbid conditions:   Hypertension., Pain.,  potential for DVT, bleeding, further neurological decline.  Recommended services:  The recommended interdisciplinary team will be comprised of the following  services:   Medical Supervision.  24 Hour Rehabilitation Nursing.  Physical Therapy.  Occupational Therapy.  Psychology.  Therapeutic Recreation.  Vocational Rehab.  Registered  Dietitian.  Patient's expected intensity and frequency of participation in the  interdisciplinary rehabilitation program: is 3 hours of therapy 5 days/week.  Prognosis and level of expected improvement with inpatient rehabilitation stay  is:   Mod I-Supervision for mobility with least restrictive device, Mod I-Supervision  for ADLs least restrictive environment  Estimated date of admission to acute inpatient:   11/18/15  Estimated length of inpatient rehabilitation stay in order to achieve rehab  medical/functional goals:   5-7 days  Anticipated destination post discharge from inpatient rehabilitation is:   community discharge with assistance.    Anticipate patient will need the following services post discharge from  inpatient rehabilitation: Outpatient therapy.  Physician Approval Status of Admission: Admission Approval:  The patient's  condition is sufficiently stable to allow active participation in an intensive  interdisciplinary inpatient rehabilitation program. This pt with lumbar lami and  fusion, right knee effusion will benefit from acute inpt rehab PT, OT, rehab  med, rehab RN, TR to maximize his functional recovery.    This assessment denotes that on admission to the inpatient rehabilitation  facility, our physician will provide documentation that demonstrates clinical  rehabilitation complications for which the patient is at risk and a specific  plan to avoid those risks. Further, the medical conditions present create  possible adverse conditions that predictably can be controlled through an  intensive rehabilitation plan of care to be outlined at admission.    Department of Medical Assistance Services [facility])  Intensive Rehabilitation Admission Certification    I. Certification Statement:    In accordance with 42 CFR 456.60, I certify that VASHAUN OSMON  meets the  admission criteria for intensive rehabilitation services set forth in 12 VAC  30-60-120.    II. Criteria Determination:  (In order to meet  intensive rehabilitation criteria, the recipient must require  all the items listed below)    The rehabilitation cannot be safely and adequately carried out in a less  intensive setting; and    The interdisciplinary coordinated team approach is required; and    The recipient requires at least 2 of the four therapies:    Physical Therapy services on a daily basis  Occupational Therapy services on a daily basis    III. Physician Signature Required:    Signed by: Jeanella Craze, BSN, RN-BC 11/18/2015 10:00:00 AM    Physician CoSigned By: Antony Contras 11/18/2015 11:17:51

## 2015-11-18 NOTE — Rehab Evaluation ([facility]) (Signed)
NAMERHILEY SOLEM  MRN: [id number]  Account: [id number]  Session Start: 11/18/2015 12:00:00 AM  Session Stop: 11/18/2015 12:00:00 AM    Rehabilitation Nursing  Inpatient Rehabilitation Admission Assessment    Rehab Diagnosis: Degenerative lumbar spinal stenosis, Spondylolisthesis of  lumbar region  Demographics:            Age: 74Y            Gender: Male  Past Medical History: Hypertension controlled  Syncopal episodes  approx 2012  1x episode - had negative cardiac work-up - no episodes sinice  BPH (benign prostatic hypertrophy)  2007  w/ hx of obstruction - s/p suprapubic prostatectomy  History of pancreatitis  2007  had microscopic stones - ERCP w/ stent done  Spinal stenosis  Spondylolisthesis  Fibromyalgia  in remission  Gastroesophageal reflux disease  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  History of Present Illness: 74 y.o. male admitted to [facility] 11/10/15 with hx chronic  pancreatitis, BPH, HTN who had chest pain before lumbar laminectomy and fusion  on 12/1 by Dr. Marjo Bicker. He had an EKG during the episode that showed no ST  elevations or LBBB. He proceeded with surgery. He states the pain was sharp,  left lower chest / left upper quadrant, and lasted /R/2 minutes. He denies chest  pain, shortness of breath, but notes acid reflux. These symptoms are sudden  onset, moderate intensity, without alleviating factors.  Surgery 12/1:  Lumbosacral fusion  Chest Pain- Etiology unclear however resolved without intervention after 2  minutes and has not recurred   EKG without acute ischemic changes, troponin negative X 1, will trend to rule  out ACS  Does note history of acid reflux which may have contributed to chest pain v  atypical in  etiology   CXR does not show infiltrate or other acute pathology, radiology read pending  No recurrence of chest pain after resolution.   Denies shortness of breath,  abdominal pain, nausea, vomiting, or reflux at this time.  States he is doing  well now.  Plan of care discussed with family at bedside.  12/3- Nausea/Vomiting- Likely due to recent surgery, acid reflux,  Restarted on  home PPI  12/4- Abdominal x-rays show ileus, no evidence of obstruction, will obtain  follow up per radiology recommendations, LFTs and Lipase without significant  acute abnormality, Already ruled out for ACS as above  11/14/15- Follow up abdominal x-rays shows mildly improved since the prior study  favoring a resolving ileus. Clear liquid diet.  Doppler to rule out DVT RLE for  R knee pain 10/10.  RESULTS:  No evidence of deep venous thrombosis involving  the right  lower extremity.  Right Knee effusion with limited range of motion, fevers, and  leukocytosis, Orthopedics performed joint aspiration 12/6: negative for  crystals, WBC 20K, Fluid Cx NGTD,  Knee x-rays show significant effusion  possibly due to inflammatory arthropathy, Surgical site appears C/D/I  Blood cx obtained, UA shows hematuria (possibly due to traumatic cath) and not  consistent with UTI, CXR shows developing right basal infiltrate however patient  without clinical signs/symptoms of  PNA (no cough, sputum production, shortness  of breath)  12/7-Patient complaining of worsening neck pain.  On eval by PT patient with  significant difficulty with passive ROM of Cervical neck.  Mild TTP along spiny  process.  MRI c spine pending  11/17/15-Neck Pain: Hx of Cervical fusion. MRI c spine 12/8 no acute findings.  Will continue symptomatic treatment.  Afebrile overnight.  12/9-Pt participating in therapy and will benefit from acute rehab @ [facility].   Pt  afebrile, medically stable to transfer today.            Date of Onset: 11/10/15            Date of Admission: 11/18/2015 2:21:00  PM    Rehabilitation Precautions Restrictions:   Fall, skin and pressure ulcer, spinal precaution  Social History:  Marital Status: divorced  Children: daughter Ian Malkin          Reside:  [place]  Employment Status:  retired  Recreational Activities/Hobbies:    HEIGHT and WEIGHT  Weight: 158.5 pounds. 72.05 kilograms. Patient weighed using bed scale.  Height: 70 inches. 1.78 meters.  BMI: 22.74 kilogram per meters squared.    ORIENTATION  Instructions and information: Administered to: Patient was given instructions  and information on hospital orientation. Orientation was provided for the  following areas: Orientation checklist, Rehab handbook, Bed controls, Call  light, Chaplain, Daily routine, Meal times, Phone and phone numbers, Visiting  hours  Primary Language: English  Armband: Correct armband in place.  Valuables/Personal Items:  Patient states that they have valuables and/or  personal items present.  The following valuables and/or personal items are present: glasses, cell phone,  tablet/e-reader The patient received information that facility is not  responsible for any valuables brought in or acquired during hospital stay.  Understanding of Current Condition: Pt stated that pain limited his mobility  Patient/Caregiver Goals:  Patient's functional goals: To be able to walk alone  with out pain    MEDICATIONS AND ALLERGIES  No B/P or Needle Sticks: Not applicable.  Personal Medications: Patient did not bring a personal supply of medications.  Vaccinations:  Influenza: November 2016  IV Access: No IV access.  Dialysis Access: Patient does not have dialysis access.    Medication Allergies: NKDA  Food Allergies: none  Other Allergies: none    Pain: Patient currently has pain.  Location: Lower back and Rt knee  Type: Acute  Quality: Aching.  Pain Scale: Numeric.  Patient reports a pain level of 6 out of 10.  Patient's acceptable level of pain 2 out of 10.   Interferes with physical activity.  Pain is  alleviated by: Pain medication  Pain is exacerbated by: Mobility Patient medicated.  Pain Reassessment: Pain was not reassessed as no pain was reported.    Elopement Risk Level Assessment Tool  Patient Criteria: Patient is not capable of leaving the unit.  Assessment is not  applicable.      RISK ASSESSMENT FOR FALLS/INJURY    MENTAL STATUS CRITERIA:   0 - None identified.  MENTAL STATUS TOTAL: 0    AGE CRITERIA:   55 - 37-59 years old  AGE TOTAL: 1    ELIMINATION CRITERIA:   3 - Toileting with Assistance.   ELIMINATION TOTAL: 3    HISTORY OF FALLS CRITERIA:   10 - Has fallen two or more times.  HISTORY OF FALLS TOTAL: 10    MEDICATIONS CRITERIA:   2 or more High Risk Medications (*see  list below)   MEDICATIONS TOTAL: 2    PHYSICAL MOBILITY CRITERIA:   3 - Decreased balance reaction.   1 - Weakness/impaired physical mobility.   PHYSICAL MOBILITY TOTAL: 4    FALLS RISK ASSESSMENT TOTAL: 20    Patient's Fall Risk: TOTAL SCORE >10: High Risk    Falls Interventions: Clutter removed and clear path to BR.  Call bell, phone, glasses, etc within reach.  Hourly toileting/safety checks between 6am and 10pm, then every 2 hours.  Initiate Fall care plan and outcome.  Yellow "high risk" patient identification in place: wrist band, socks, chart  sticker, door sign.  Pt and family education.  Assistive devices at [facility].  Pharmacy review of meds.  Bed alarm      SEVERE SEPSIS SCREEN  INFECTION:  Patient has no indication of infection.  Negative Sepsis Screen.  If you are unable to assess a system's dysfunction because you do not have labs,  or the labs you have are not current (within 24 hours), call physician and  request and order for the lab tests needed.      Restraint Initial: Patient does not have any restraints at this time.    NUTRITION/DIET  IRF-PAI Swallowing Status: Swallowing Status: Regular Food: solids and liquids  swallowed safely without supervision or modified food consistencies.  IRF-PAI Dehydration: Patient does not  display clinical signs of dehydration.  Nutrition Screen: Patient's nutrition risk factors include: No risk factors  identified.  Diet: Type: Regular.  Food Consistency: Regular.  Liquid Consistency: Thin.    PSYCHOSOCIAL  Psychosocial/Abuse Screen: No evidence of neglect/abuse.  Suicide Risk Screen: Patient does not have a primary or secondary behavioral  health diagnosis or complaint.    Current Alcohol Use:  drinks 1 glass wine per night  Current Tobacco Use:  No, patient does not use tobacco.  Current Drug Use: No, patient does not use recreational drugs.    REVIEW OF SYSTEMS  Eyes:   Right Eye:  Reactivity: Pupils equal, round and reactive to light and accommodation  (PERRLA).  Sclera Color: Clear   Eye is not draining     Left Eye:  Reactivity: Pupils equal, round and reactive to light and accommodation  (PERRLA).  Sclera Color: Clear   Eye is not draining  Ears: Bilateral Ears: Within normal limits.  Hearing/Communication Device: None.  Mouth: Gums: Moist. Pink.  Tongue: Pink  Teeth: Missing Teeth: Lower jaw Oral hygiene appears to be adequate.    NEURO  Orientation/Awareness: Alert and Oriented x4.  Behavior: Cooperative.  Speech: No deficits noted at this time.    CARDIOVASCULAR     Bilateral lower extremities  Nail Bed Color: Pink.   No edema or redness present.  Homan's Sign:   Negative bilateral lower extremities  Pulses:   Apical Pulse: Regular. Strong. Rate is 96 .   Patient does not have a pacemaker.   Patient does not have a defibrillator.    CARDIOPULMONARY  Lung Sounds:   Upper lobes. Clear.   Lower lobes. Clear.  Type of Respirations: Regular.  Cough: No cough noted.  Respiratory Support: The patient does not require any respiratory support.  Respiratory Equipment: None. O2 sat 96% on room air    INTEGUMENTARY  Skin:  Temperature: Warm  Turgor: Normal for age  Moisture: Dry  Color of skin: Normal for Race/Ethnicity  Capillary Refill: Less than 3 seconds  Wound/Incisions:     Surgical Incision:  Back incision. Length: 12 centimeter(s) with glue.  Drainage:  Incision without drainage.  Odor:  No  Incision Care: Per protocol.       Blanchable redness noted on sacrum, z guard then meplex applied       Rt knee aspiration area no swelling, redness noted  Braden Scale for Predicting Pressure Sore Risk: Sensory Perception: Slightly  limited  Moisture: Rarely moist  Activity: Chairfast  Mobility: No limitation  Nutrition: Adequate  Friction and Shear: No apparent problem  Braden Score: 19  Level of Risk: No risk (19-23). Will reassess every shift.    GENITOURINARY  Current Bladder Pattern: Continent  Color:  Yellow Clear   Patient denies problems with urination and/or catheter.    Sexuality: The patient denies any concerns regarding sexuality.    GASTROINTESTINAL  Abdomen: Soft. Nontender.  Bowel Sounds:  Bowel sounds audible in all four quadrants.  Date of Last Bowel Movement:  11/17/15  Current Bowel Pattern:  Continent   No Problems/Complaints with Bowel Elimination Assessed.  The patient has normal bowel activity.  Bowel Movements: The patient has bowel movements every other day. Patient uses  laxatives.    Bowel and Bladder Output:                       Bladder (# only)             Bowel (# only)  Number of Episodes  Continent            1                            0  Incontinent          0                            0    MUSCULOSKELETAL  Hand Dominance:  Right.  Upper Extremities  Impairments/Prosthesis/Devices: WNL  Lower Extremities  Impairments/Prosthesis/Devices: BLE weakness, RLE limited mobility due to knee  pain    FUNCTIONAL MEASURES  Bladder Frequency/Number of Accidents (4 days prior to admission):  Bladder  accidents prior to admission:  0 Patient has not had an accident 4 days prior to  admission.  Bowel Frequency/Number of Accidents (4 days prior to admission):  Bowel  accidents prior to admission:  0 Patient has not had an accident 4 days prior to  admission.    EATING: Eating Score = 7. Patient  is completely independent for eating.  There  are no activity limitations.    GROOMING: Grooming Score = 5. Patient is supervision/set-up for grooming,  requiring: Stand by assistance.  Patient requires the following assistive device(s) No assistive devices were  required.    BATHING: Patient bathed in bed. Patient requires minimal assistance for washing,  rinsing, or drying the right arm. Patient requires minimal assistance for  washing, rinsing, or drying the left arm. Patient requires minimal assistance  for washing, rinsing, or drying the chest. Patient requires minimal assistance  for washing, rinsing, or drying the abdomen. Patient requires moderate  assistance for washing, rinsing, or drying the perineal area. Patient requires  total assistance for washing, rinsing, or drying the buttocks. Patient requires  total assistance for washing, rinsing, or drying the right upper leg. Patient  requires total assistance for washing, rinsing, or drying the left upper leg.  Patient requires total assistance for washing, rinsing, or drying the right  lower leg, including the foot.  Patient requires total assistance for washing,  rinsing, or drying the left lower leg, including the foot. Patient performs 0 -  24% of bathing tasks.  Bathing Score = 1, Total Assistance.  Patient requires the following assistive device(s):    UPPER BODY DRESSING: Wearing a bra or undershirt was not applicable for this  patient. Patient requires moderate physical assistance for threading the right  arm through the garment (shirt/sweater). Patient requires moderate physical  assistance for threading the left arm through the garment (shirt/sweater).  Patient requires maximal physical assistance for pulling an over-head-garment  over head or pulling front-fastening-garment around back. Patient requires  maximal physical assistance for pulling an over-head-garment down the trunk or  adjusting/fastening together a front-fastening-garment. Patient  performs 37.5 %  of upper body dressing tasks. Upper Body Dressing Score = 2, Maximal Assistance.    Patient requires the following assistive device(s):  No assistive devices were  required.    LOWER BODY DRESSING: Wearing underwear or an undergarment is not applicable for  this patient. Patient requires total assistance for donning and/or doffing  pants/skirt threading right leg. Patient requires total assistance for donning  and/or doffing pants/skirt threading left leg. Patient requires total assistance  for donning and/or doffing pants/skirt over hips and adjusting fastener. Patient  requires total assistance for donning and/or doffing right sock. Patient  requires total assistance for donning and/or doffing left sock. Patient requires  total assistance for donning and/or doffing right shoe. Patient requires total  assistance for donning and/or doffing left shoe. Patient performs 0 -  24% of  lower body dressing tasks. Lower Body Dressing  Score = 1, Total Assistance.  Patient requires the following assistive device(s): No assistive devices were  required.    TOILETING: Patient requires maximal assistance for adjusting clothing before  using a toilet, commode, bedpan, or urinal. Patient requires maximal assistance  for hygiene. Patient requires maximal assistance for adjusting clothing after  using a toilet, commode, bedpan, or urinal. Patient performs 0 -  24% of  toileting tasks.  Toileting Score = 1, Total Assistance.  Patient requires the following assistive device(s):  Grab bar.    BLADDER MANAGEMENT - LEVEL OF ASSIST: Bladder Score = 7. Patient is completely  independent for bladder management. There are no activity limitations.    BLADDER ACCIDENTS THIS SHIFT:  0 . Patient has not had an accident but used a  urinal this assessment.    BOWEL MANAGEMENT - LEVEL OF ASSIST: Bowel Score = 6. Patient is modified  independent for bowel management requiring: Stool softeners    BOWEL ACCIDENTS THIS SHIFT: 0 .  Patient has not had an accident, but used a  stool softener.    TRANSFERS BED/CHAIR/WHEELCHAIR: Bed/chair/wheelchair Transfer Score = 2.  Patient performs 25-49% of effort and requires maximal assistance (most of the  lifting) for transferring to and from the bed/chair/wheelchair.  Patient requires the following assistive device(s): Walker.  Bed rails.    TRANSFER TOILET: Toilet Transfer Score = 3.  Patient performs 50-74% of effort  and requires moderate assistance (some lifting) for transferring to and from the  toilet/commode.  Patient requires the following assistive device(s):  Grab bars.    COMPREHENSION: Auditory comprehension is the usual mode. Comprehension Score =  7, Independent.  Patient comprehends complex/abstract information in their  primary language.  Patient is completely independent for auditory comprehension.   There are no activity limitations.    EXPRESSION: Vocal expression is the usual mode. Expression Score =  7,  Independent.  Patient expresses complex/abstract information in their primary  language.  Patient is completely independent for vocal expression.  There are no  activity limitations.    SOCIAL INTERACTION: Social Interaction Score = 7, Independent. Patient is  completely independent for social interaction.  There are no activity  limitations.    PROBLEM SOLVING: Problem Solving Score = 7, Independent.  Patient makes  appropriate decisions in order to solve complex problems.  Patient is completely  independent for problem solving.  There are no activity limitations.    MEMORY: Memory Score = 5, Supervision.  Patient recognizes and remembers with  prompting only under stressful or unfamiliar conditions, but no more than 10% of  the time, for the following behavior(s): Orientation to person, place, time or  situation    IRFPAI GOALS    Bathing; 6  Lower Body Dressing: 6  Toileting: 6  Bed, Chair, Wheelchair Transfers: 6  Toilet Transfers: 6    FUNCTION  Functional Screen: Patient's  functional risk limitations include: Decreased use  of one or both lower extremities.  Requires assistance for ambulation.  Significant ortho/surgical episode.  Unable to perform Activities of Daily Living. Therapy consults were ordered.  Discharge Planning Screen: Potential discharge planning issues include: Stairs  at home with impaired mobility. Social Services/Case Management consult was  ordered.    Interdisciplinary Educational Needs and Learning Preferences:       Learning Preference: The patient's preferred learning method is:  Explanation.  The patient's preferred learning method is: Demonstration.  The patient's preferred learning method is: Programme researcher, broadcasting/film/video.       Barriers to Learning: No barriers.       Learning Needs: Functional activities/mobility, Medical management, Pain  management, Plan of care, Precautions, Safety, Skin/wound care, Spinal cord  injury specific    Education Provided: Pain management. Pain scale. Medication options. Side  effects. Skin/wound care. Signs/symptoms of infection. Role of nutrition in  wound prevention/healing. Incision care. Skin care for the incontinent patient.  Safety. Medication. Name and dosage. Administration. Purpose. Side Effects.  Interaction. Spinal cord. Proper positioning  for skin management.  Pressure relief management.  Pressure ulcer.       Audience: Patient.       Mode: Explanation.  Demonstration.       Response: Applied knowledge.  Verbalized understanding.    CARE Tool  Self Care Performance:   Eating: Patient completed the activities by him/herself with no assistance from  a helper.   Oral Hygiene: Helper sets up or cleans up; patient completes activity. Helper  assists only prior to or following the activity.   Toileting Hygiene: Helper does all of the effort. Patient does none of the  effort to complete the activity. Or, the assistance of 2 or more helpers is  required for the patient to complete the activity.   Shower/Bathe Self: Helper does all  of the effort. Patient does none of the  effort to complete the activity. Or, the assistance of 2 or more helpers is  required for the patient to complete the activity.   Upper Body Dressing: Helper does more than half the effort. Helper lifts or  holds trunk or limbs and provides more than half the effort.   Lower Body Dressing: Helper does all of the effort. Patient does none of the  effort to complete the activity. Or, the assistance of 2 or more helpers is  required for the patient to complete the activity.   Putting On/Taking Off  Footwear: Helper does all of the effort. Patient does  none of the effort to complete the activity. Or, the assistance of 2 or more  helpers is required for the patient to complete the activity.  Special Treatments, Procedures, and Programs: Patient did not receive total  parenteral nutrition treatment at the time of admission.    ASSESSMENT  Summary of Rehab Nursing-specific Deficits:   Pain (Acute, Chronic, Pain management)   Safety: Risk for fall   Self-care deficits (Feeding, Toileting, Bathing and Hygiene, Medication  management)   Skin Integrity  Rehab Potential: Able to participate in an intensive inpatient interdisciplinary  rehabilitation program, Good family/social support, Good premorbid functional  status, Good premorbid medical status, Motivated  Barriers to Progress/Discharge: No potential barriers to progress.    Long Term Goals:   Time frame to achieve long term goal(s): 2 weeks from 11/18/15         1. Patient will call for help 100% of time before getting OOB inorder to  avoid fall during his recovery time       2. Patient will verbalize pain 2/10 before and during therapy with the help  of current pain medication       3. Patient's surgica incision will heal with no complication       4.  Patient will identify at least 2 sideffects of his medications inorder  to get prompt intervention  Short Term Goals:  Time frame to achieve short term goal(s): 1 week from  11/18/15         1. Patient will call for help 100% of time before getting OOB inorder to  avoid fall during his recovery time       2. Patient will verbalize pain 2/10 before and during therapy with the help  of current pain medication       3. Patient will identify at least 2 sideffects of 75% his medications  inorder to get prompt intervention  Pt alert and oriented, able to stand and pivot during transfer, verbalize pain  during mobility, ordered pain medication given    PLAN  Nursing-specific Interventions:   Medical Condition(s) Management in Collaboration with MD:   Medication Management:   Skin Management:   Pain Management:   Wound Management:    TEAM CARE PLAN  Identified problems from team documentation:      Identified problems from this assessment:     No problems identified at this time.    Please review Integrated Patient View Care Plan Flowsheet for Team identified  Problems, Interventions, and Goals.    Signed by: Lowella Dell, RN 11/18/2015 4:20:00 PM

## 2015-11-18 NOTE — Discharge Summary (Signed)
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 Degenerative lumbar spinal stenosis  . Lumbar stenosis  . Spondylolisthesis of lumbar region  . Hypertension  . BPH (benign prostatic hypertrophy)    Problem Lists:  Patient Active Problem List   Diagnosis   . Degenerative lumbar spinal stenosis   . Lumbar stenosis   . Spondylolisthesis of lumbar region   . Hypertension   . BPH (benign prostatic hypertrophy)   . 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      Terrilee Croak, MD    11/17/2015 2:14 PM         XR Knee 1 Or 2 Views Right   Final Result   . Significant joint effusion with focal swelling. Findings   are nonspecific but could reflect reactive effusion from an inflammatory   arthropathy or occult trauma.   2. There is no evidence of acute bone injury   3. Mild nonspecific degenerative joint changes. The meniscal   calcifications raising the concern for an underlying deposition disease       Marty Heck, MD    11/14/2015 2:13 PM         XR Chest AP Portable    Final Result   . Developing right basilar infiltrate      Marty Heck, MD    11/14/2015 2:11 PM         US Venous Duplex Doppler Leg Right   Final Result    No evidence of deep venous thrombosis involving the right   lower extremity.                         Will Bonnet, MD    11/14/2015 10:02 AM         XR Abdomen Portable   Final Result    Improving ileus.      Annamary Carolin, MD    11/14/2015 8:42 AM         XR Abdomen Portable   Final Result    Nonspecific bowel gas pattern favoring ileus. Continued   radiographic follow-up recommended.      Elizebeth Koller, MD    11/13/2015 12:57 PM         XR Abdomen Portable   Final Result      There is no evidence of a bowel obstruction. Findings are suggestive of   an ileus. An early small bowel obstruction cannot be excluded.  Continued   follow-up as clinically warranted.      Stephannie Peters, MD    11/12/2015 3:55 PM         XR Chest AP Portable   Final Result    No acute disease.      Phillips Odor, MD    11/11/2015 3:02 PM         Fluoroscopy greater than 1 hour   Final Result    Lumbosacral fusion under fluoroscopic guidance.               Nelta Numbers, MD    11/10/2015 7:20 PM         XRAY SCAN - [id number]   Final Result      MRI SCAN   Final Result      XRAY SCAN - [id number]   Final Result      MRI SCAN   Final Result      OUTSIDE RADIOLOGY SCAN   Final Result      MRI SCAN   Final Result      OUTSIDE RADIOLOGY SCAN   Final Result      MRI Knee Right  WO Contrast    (Results Pending)       Presenting history and hospital Course:   HPI per Dr. Ellen Henri 11/11/15  This 74 y.o. year old RHD male was admitted to [facility] on 11-10-15 with lumbar stenosis and BLE neurogenic claudication. He underwent L4-S1 laminectomy and fusion. Post op course has been complicated by ileus and vomiting. He also had severe right knee pain and aseptic inflammation.     Subjective: 12/9  Patient notes that knee pain is improved. He noted episode of dizziness while  standing too quickly but otherwise feeling well.      HOSPITAL COURSE  Neuro: Patient admitted 12/1 with lumbar stenosis and neurogenic claudication, he underwent lumbar fusion (l4-s1 laminectomy and fusion).    Post op course complicated by Nausea/vomiting likely due to recent surgery, acid reflux, and ileus noted on x-ray.  Symptoms resolved and patient was restarted on home PPI.  Abdominal x-rays show ileus, no evidence of obstruction.  LFTs and Lipase without significant acute abnormality.  Already ruled out for ACS with serial negative troponins. Bowel Regimen     Patient also noted to have aseptic inflammation of Right knee with intermittent fevers.  Noted right Knee effusion with limited range of motion, fevers, and leukocytosis. Orthopedics performed joint aspiration 12/6: negative for crystals, WBC 20K, Fluid Cx NGTD  Knee x-rays show significant effusion possibly due to inflammatory arthropathy, duplex of RLE negative for DVT. Blood cx obtained, UA shows hematuria (possibly due to traumatic cath) and not consistent with UTI.  CXR shows developing right basal infiltrate however patient without clinical signs/symptoms of PNA (no cough, sputum production, shortness of breath). MRI Right Knee was planned, however patient continued to show clinical improvement.  Blood Cx NGTD 12/5    #Neck Pain: Hx of Cervical fusion. Noted worsening neck pain and described "popping sensation" MRI c spine 12/8 no acute findings. Improved prior to discharge.    #Confusion: Noted to have intermittent confusion 12/7 in setting of fevers and IV narcotics. Confusion improved with resolution of fevers and decrease in narcotic use. A&Ox3 prior to discharge.     Physical exam at discharge:  Filed Vitals:    11/18/15 0703   BP: 134/69   Pulse: 109   Temp: 97.4 F (36.3 C)   Resp: 18  SpO2: 96%     General: awake, alert, oriented x 3;  HEENT: Neck full ROM prior to discharge.  No erytherma/warmth fluctuance.   Cardiovascular:  regular rate and rhythm, no murmurs, rubs or gallops  Lungs: clear to auscultation bilaterally, without wheezing, rhonchi, or rales  Abdomen: soft, non-tender, non-distended; no palpable masses, no hepatosplenomegaly, normoactive bowel sounds, no rebound or guarding  Extremities: no clubbing, cyanosis, or edema  MSK: L knee with full ROM, R knee improved ROM with extension/flexion (limited but improved), Effusion improved.   Neuro: CN grossly intact, speech fluent, follows commands, 5/5 b/l UE and LE strength     Discharge Medications:        Medication List      START taking these medications          cyclobenzaprine 5 MG tablet   Commonly known as:  FLEXERIL   Take 1 tablet (5 mg total) by mouth 3 (three) times daily as needed for Muscle spasms.       dextromethorphan-guaiFENesin 30-600 MG per 12 hr tablet   Commonly known as:  MUCINEX DM   Take 1 tablet by mouth 2 (two) times daily as needed.       morphine 15 MG Tbcr 12 hr tablet   Commonly known as:  MS CONTIN   Take 1 tablet (15 mg total) by mouth every 8 (eight) hours.       pantoprazole 40 MG tablet   Commonly known as:  PROTONIX   Take 1 tablet (40 mg total) by mouth daily.         CHANGE how you take these medications          HYDROmorphone 2 MG tablet   Commonly known as:  DILAUDID   Take 1 tablet (2 mg total) by mouth every 4 (four) hours as needed.   What changed:  See the new instructions.         CONTINUE taking these medications          ALPRAZolam 0.5 MG tablet   Commonly known as:  XANAX       amLODIPine-benazepril 5-10 MG per capsule   Commonly known as:  LOTREL 5-10       atorvastatin 10 MG tablet   Commonly known as:  LIPITOR       BELSOMRA 20 MG Tabs   Generic drug:  Suvorexant         STOP taking these medications          docusate sodium 100 MG capsule   Commonly known as:  COLACE       ibuprofen 600 MG tablet   Commonly known as:  ADVIL,MOTRIN       multivitamin tablet       pramipexole 0.25 MG tablet   Commonly known as:  MIRAPEX             Where to Get Your Medications      These medications were sent to [facility]  [address] [place] [address]    Hours:  Monday-Friday 8A to 8P, Saturday/Sunday 9A to 5P Phone:  [phone]    - cyclobenzaprine 5 MG tablet  - dextromethorphan-guaiFENesin 30-600 MG per 12 hr tablet  - pantoprazole 40 MG tablet      You can get these medications from any pharmacy     Bring a paper prescription for each of these medications    - HYDROmorphone 2 MG tablet  - morphine  15 MG Tbcr 12 hr tablet           Discharge Instructions:   Clelia Schaumann, MD  [address]  [address] [place] [address]  [phone]    In 2 weeks  Appt has been scheduled    [facility]  [address]  [place] [address]  [phone]        TIME SPENT:   On discharge and care coordination is 45 minutes. I have spent a large amount of time explaining everything to the patient and family at bedside, going over all the details of discharge diagnoses and the follow up plan. Patient has been supplied with copies of the laboratory tests as well as radiological studies that were done during this hospitalization. I will fax a discharge summary to the patient's PMD.    Signed by: Les Pou, MD    CC to: Pcp, [facility], MD

## 2015-11-18 NOTE — Progress Notes (Signed)
DISCHARGE PLANNING    [facility]  Room: 524  Call Report: [phone]    Transportation:     1PM x ambulance to room.    Kelton Pillar, MSW, [facility] Discharge Planner  [facility]  [place] [address]  [employer] [phone]

## 2015-11-19 LAB — COMPREHENSIVE METABOLIC PANEL
ALT: 25 U/L (ref 0–55)
AST (SGOT): 12 U/L (ref 5–34)
Albumin/Globulin Ratio: 1 (ref 0.9–2.2)
Albumin: 2.5 g/dL — ABNORMAL LOW (ref 3.5–5.0)
Alkaline Phosphatase: 141 U/L — ABNORMAL HIGH (ref 38–106)
Anion Gap: 7 (ref 5.0–15.0)
BUN: 12 mg/dL (ref 9–28)
Bilirubin, Total: 1 mg/dL (ref 0.2–1.2)
CO2: 30 mEq/L — ABNORMAL HIGH (ref 22–29)
Calcium: 8.9 mg/dL (ref 7.9–10.2)
Chloride: 102 mEq/L (ref 100–111)
Creatinine: 0.8 mg/dL (ref 0.7–1.3)
Globulin: 2.4 g/dL (ref 2.0–3.6)
Glucose: 103 mg/dL — ABNORMAL HIGH (ref 70–100)
Potassium: 3.9 mEq/L (ref 3.5–5.1)
Protein, Total: 4.9 g/dL — ABNORMAL LOW (ref 6.0–8.3)
Sodium: 139 mEq/L (ref 136–145)

## 2015-11-19 LAB — CBC AND DIFFERENTIAL
Basophils Absolute Automated: 0.03 10*3/uL (ref 0.00–0.20)
Basophils Automated: 0 %
Eosinophils Absolute Automated: 0.18 10*3/uL (ref 0.00–0.70)
Eosinophils Automated: 2 %
Hematocrit: 37.2 % — ABNORMAL LOW (ref 42.0–52.0)
Hgb: 12.6 g/dL — ABNORMAL LOW (ref 13.0–17.0)
Immature Granulocytes Absolute: 0.14 10*3/uL — ABNORMAL HIGH
Immature Granulocytes: 2 %
Lymphocytes Absolute Automated: 1.83 10*3/uL (ref 0.50–4.40)
Lymphocytes Automated: 23 %
MCH: 31.5 pg (ref 28.0–32.0)
MCHC: 33.9 g/dL (ref 32.0–36.0)
MCV: 93 fL (ref 80.0–100.0)
MPV: 9.1 fL — ABNORMAL LOW (ref 9.4–12.3)
Monocytes Absolute Automated: 0.95 10*3/uL (ref 0.00–1.20)
Monocytes: 12 %
Neutrophils Absolute: 4.87 10*3/uL (ref 1.80–8.10)
Neutrophils: 62 %
Nucleated RBC: 0 /100 WBC (ref 0–1)
Platelets: 350 10*3/uL (ref 140–400)
RBC: 4 10*6/uL — ABNORMAL LOW (ref 4.70–6.00)
RDW: 12 % (ref 12–15)
WBC: 7.86 10*3/uL (ref 3.50–10.80)

## 2015-11-19 LAB — GFR: EGFR: >60

## 2015-11-19 MED ORDER — HYDROCORTISONE 1 % EX CREA
TOPICAL_CREAM | Freq: Two times a day (BID) | CUTANEOUS | Status: DC
Start: 2015-11-19 — End: 2015-11-25
  Filled 2015-11-19 (×2): qty 28.4

## 2015-11-19 MED ORDER — LISINOPRIL 5 MG PO TABS
5.0000 mg | ORAL_TABLET | Freq: Every day | ORAL | Status: DC
Start: 2015-11-20 — End: 2015-11-25
  Administered 2015-11-20 – 2015-11-24 (×5): 5 mg via ORAL
  Filled 2015-11-19 (×6): qty 1

## 2015-11-19 MED ORDER — AMLODIPINE BESYLATE 5 MG PO TABS
2.5000 mg | ORAL_TABLET | Freq: Every day | ORAL | Status: DC
Start: 2015-11-20 — End: 2015-11-25
  Administered 2015-11-20 – 2015-11-24 (×5): 2.5 mg via ORAL
  Filled 2015-11-19 (×6): qty 1

## 2015-11-19 NOTE — Rehab Progress Note ([facility]) (Signed)
[name]  MRN: [id number]  Account: [id number]  Session Start: 11/19/2015 12:00:00 AM  Session Stop: 11/19/2015 12:00:00 AM    SEVERE SEPSIS SCREEN  INFECTION:  Patient has no indication of infection.  Negative Sepsis Screen.  If you are unable to assess a system's dysfunction because you do not have labs,  or the labs you have are not current (within 24 hours), call physician and  request and order for the lab tests needed.    Signed by: Candida Peeling, RN 11/19/2015 9:15:00 PM

## 2015-11-19 NOTE — Rehab Progress Note ([facility]) (Signed)
NAMEBERRY GODSEY  MRN: [id number]  Account: [id number]  Session Start: 11/19/2015 12:00:00 AM  Session Stop: 11/19/2015 12:00:00 AM    SEVERE SEPSIS SCREEN  INFECTION:  Patient has no indication of infection.  Negative Sepsis Screen.  If you are unable to assess a system's dysfunction because you do not have labs,  or the labs you have are not current (within 24 hours), call physician and  request and order for the lab tests needed.    Signed by: Lowella Dell, RN 11/19/2015 8:17:00 AM

## 2015-11-19 NOTE — Rehab Progress Note ([facility]) (Addendum)
Corrected 11/20/2015 2:03:19 PM    NAME: Matthew [name]  MRN: [id number]  Account: [id number]  Session Start: 11/19/2015 12:00:00 AM  Session Stop: 11/19/2015 12:00:00 AM    Rehabilitation Nursing  Inpatient Rehabilitation Shift Assessment    Rehab Diagnosis: Degenerative lumbar spinal stenosis, Spondylolisthesis of  lumbar region  Demographics:            Age: 74Y            Gender: Male  Primary Language: English    Date of Onset:  11/10/15  Date of Admission: 11/18/2015 2:21:00 PM    Rehabilitation Precautions Restrictions:   Fall, skin and pressure ulcer, spinal precaution    Patient Report: ''I know some sideffects of my pain medication"  Pain: Patient currently has pain.  Location: back and rt knee  Type: Acute  Quality: Aching.  Pain Scale: Numeric.  Patient reports a pain level of 6 out of 10.  Patient's acceptable level of pain 2 out of 10.   Interferes with physical activity.  Pain is alleviated by: Pain medication  Pain is exacerbated by: mobility Patient medicated.  Pain Reassessment:  Response to Pain Intervention: Left leg  Post Intervention Pain Quality:  None.  Patient Reports Post Intervention Pain Level of: 0 out of 10  Pain Acceptable: Yes  Patient/Caregiver Goals:  To be able to walk alone with out pain    NEURO  Orientation/Awareness: Alert and Oriented x4.  Speech: No deficits noted at this time.  Behavior: Cooperative.    MEDICATIONS  IV Access: No IV access.  Dialysis Access: Patient does not have dialysis access.      Elopement Risk Level Assessment Tool  Patient Criteria: Patient is not capable of leaving the unit.  Assessment is not  applicable.      RISK ASSESSMENT FOR FALLS/INJURY    MENTAL STATUS CRITERIA:   0 - None identified.  MENTAL STATUS TOTAL: 0    AGE CRITERIA:   30 - 42-45 years old  AGE TOTAL: 1    ELIMINATION CRITERIA:   3 - Toileting with Assistance.   ELIMINATION TOTAL: 3    HISTORY OF FALLS CRITERIA:   2 - Unknown History.  HISTORY OF FALLS TOTAL: 2    MEDICATIONS CRITERIA:   2  or more High Risk Medications (*see list below)   MEDICATIONS TOTAL: 2    PHYSICAL MOBILITY CRITERIA:   3 - Decreased balance reaction.   1 - Weakness/impaired physical mobility.   PHYSICAL MOBILITY TOTAL: 4    FALLS RISK ASSESSMENT TOTAL: 12    Patient's Fall Risk: TOTAL SCORE >10: High Risk    Falls Interventions:  Clutter removed and clear path to BR.  Call bell, phone, glasses, etc within reach.  Hourly toileting/safety checks between 6am and 10pm, then every 2 hours.  Initiate Fall care plan and outcome.  Yellow "high risk" patient identification in place: wrist band, socks, chart  sticker, door sign.  Pt and family education.  Assistive devices at [facility].  Pharmacy review of meds.  Bed alarm    NUTRITION  Diet:   Type: Regular.  Food Consistency: Regular.  Liquid Consistency: Thin.      CARDIOVASCULAR     Bilateral lower extremities  Nail Bed Color: Pink.  Pulses:   Apical Pulse: Regular. Strong. Rate is 88.   Patient does not have a pacemaker.   Patient does not have a defibrillator.    CARDIOPULMONARY  Lung Sounds:   Upper lobes. Clear.  Lower lobes. Clear.  Type of Respirations:  Regular.  Cough:  No cough noted.  Respiratory Support:  The patient does not require any respiratory support.  Respiratory Equipment:  None. O2 sat 97% on room air.    INTEGUMENTARY  Skin:  Temperature: Warm  Turgor: Normal for age  Moisture: Dry  Color of skin: Normal for Race/Ethnicity  Capillary Refill: Less than 3 seconds  Wounds/Incisions:     Surgical Incision: Back incision. Length: 12 centimeter(s) with glue.  Drainage: Incision without drainage.  Odor:  No  Incision Care: Per protocol.       Blanchable redness on sacrum heeled, z guard then meplex applied for more days       Rt knee aspiration area intact and left open to air    Braden Scale for Predicting Pressure Sore Risk:  Sensory Perception: No  impairment  Moisture: Rarely moist  Activity: Chairfast  Mobility: Slightly limited  Nutrition: Adequate  Friction and Shear:  Potential problem  Braden Score: 18  Level of Risk: At risk (15-18)   Pressure Relief every 30 minutes while in wheelchair   Turn patient every 2 hours   Assist patient to the bathroom every 2 hours    GASTROINTESTINAL  Abdomen:  Soft. Nontender.  Bowel Sounds:  Active bowel sounds audible in all four quadrants.  Date of Last Bowel Movement:  11/18/15  Current Bowel Pattern:  Continent   No Problems/Complaints with Bowel Elimination Assessed.    GENITOURINARY  Current Bladder Pattern: Continent  Color:  Yellow Clear   Patient denies problems with urination and/or catheter.    Bowel and Bladder Output:                       Bladder (# only)             Bowel (# only)  Number of Episodes  Continent            3                            0  Incontinent          0                            0    MUSCULOSKELETAL  Upper Body: WNL  Lower Body: BLE weakness, RLE limited mobility due to knee pain    Functional Measures      BLADDER MANAGEMENT - LEVEL OF ASSIST: Bladder Score = 5.  Patient is  supervision/set-up for bladder management, requiring: Emptying equipment.  Patient requires the following assistive device(s): Urinal.    BLADDER ACCIDENTS THIS SHIFT:  0 . Patient has not had an accident but used a  urinal this assessment.    BOWEL MANAGEMENT - LEVEL OF ASSIST: Bowel Score = 6.  Patient is modified  independent for bowel management.  Patient did not have bowel movement.  Medication/intervention was provided.    BOWEL ACCIDENTS THIS SHIFT: 0 . Patient has not had an accident, but used a  stool softener.    COMPREHENSION: Auditory comprehension is the usual mode. Comprehension Score =  7, Independent.  Patient comprehends complex/abstract information in their  primary language.  Patient is completely independent for auditory comprehension.   There are no activity limitations.    EXPRESSION: Vocal expression is the usual mode. Expression Score = 7,  Independent.  Patient expresses complex/abstract information in their  primary  language.  Patient is completely independent for vocal expression.  There are no  activity limitations.    SOCIAL INTERACTION: Social Interaction Score = 7, Independent. Patient is  completely independent for social interaction.  There are no activity  limitations.    PROBLEM SOLVING: Problem Solving Score = 7, Independent.  Patient makes  appropriate decisions in order to solve complex problems.  Patient is completely  independent for problem solving.  There are no activity limitations.    MEMORY: Memory Score = 7, Independent.  Patient is completely independent for  memory.  There are no activity limitations.    Education Provided:   No education provided this session.    Discharge:  Patient is not being discharged at this time.    Long Term Goals: 1. Patient will call for help 100% of time before getting OOB  inorder to avoid fall during his recovery time   2. Patient will verbalize pain 2/10 before and during therapy with the help of  current pain medication   3. Patient's surgica incision will heal with no complication   4. Patient will identify at least 2 sideffects of his medications inorder to  get prompt intervention  2 weeks from 11/18/15  Short Term Goals: 1. Patient will call for help 100% of time before getting OOB  inorder to avoid fall during his recovery time   2. Patient will verbalize pain 2/10 before and during therapy with the help of  current pain medication   3. Patient will identify at least 2 sideffects of 75% his medications inorder  to get prompt intervention  1 week from 11/18/15    PROGRESS TOWARD GOALS:  Pt calls for help, his pain managed well with the  current pain medications, identified sideffects of his pain medication, needs  reinforcement on SE of other meds.    PLAN: Nursing Specific Interventions  Medical Condition Management. Medication Management. Pain Management. Skin  Management. Wound Management.   Continue with the current Nursing Plan of Care.    TEAM CARE  PLAN  Identified problems from team documentation:      Add/Update Problems from this assessment:  No updates at this time.    Please review Integrated Patient View Care Plan Flowsheet for Team identified  Problems, Interventions, and Goals.    Signed by: Lowella Dell, RN 11/19/2015 9:50:00 AM

## 2015-11-19 NOTE — Rehab Progress Note ([facility]) (Signed)
Matthew [name]  MRN: [id number]  Account: [id number]  Session Start: 11/19/2015 12:00:00 AM  Session Stop: 11/19/2015 12:00:00 AM    Rehabilitation Nursing  Inpatient Rehabilitation Shift Assessment    Rehab Diagnosis: Degenerative lumbar spinal stenosis, Spondylolisthesis of  lumbar region  Demographics:            Age: 74Y            Gender: Male  Primary Language: English    Date of Onset:  11/10/15  Date of Admission: 11/18/2015 2:21:00 PM    Rehabilitation Precautions Restrictions:   Fall, skin and pressure ulcer, spinal precaution    Patient Report: "OK"  Pain: Patient currently without complaints of pain.  Pain Reassessment: Pain was not reassessed as no pain was reported.  Patient/Caregiver Goals:  To be able to walk alone with out pain    NEURO  Orientation/Awareness: Alert and Oriented x3.  Speech: Clear.  Behavior: Cooperative.    MEDICATIONS  IV Access: No IV access.  Dialysis Access: Patient does not have dialysis access.      Elopement Risk Level Assessment Tool  Patient Criteria: Patient is not capable of leaving the unit.  Assessment is not  applicable.      RISK ASSESSMENT FOR FALLS/INJURY    MENTAL STATUS CRITERIA:   0 - None identified.  MENTAL STATUS TOTAL: 0    AGE CRITERIA:   33 - 73-65 years old  AGE TOTAL: 1    ELIMINATION CRITERIA:   3 - Toileting with Assistance.   ELIMINATION TOTAL: 3    HISTORY OF FALLS CRITERIA:   2 - Unknown History.  HISTORY OF FALLS TOTAL: 2    MEDICATIONS CRITERIA:   2 or more High Risk Medications (*see list below)   MEDICATIONS TOTAL: 2    PHYSICAL MOBILITY CRITERIA:   3 - Decreased balance reaction.   1 - Weakness/impaired physical mobility.   PHYSICAL MOBILITY TOTAL: 4    FALLS RISK ASSESSMENT TOTAL: 12    Patient's Fall Risk: TOTAL SCORE >10: High Risk    Falls Interventions: Clutter removed and clear path to BR.  Call bell, phone, glasses, etc within reach.  Hourly toileting/safety checks between 6am and 10pm, then every 2 hours.  Initiate Fall care plan and  outcome.  Yellow "high risk" patient identification in place: wrist band, socks, chart  sticker, door sign.  Pt and family education.  Assistive devices at [facility].  Pharmacy review of meds.  Bed alarm    NUTRITION  Diet:  Type: Regular.  Food Consistency: Regular.  Liquid Consistency: Thin.      CARDIOVASCULAR     Bilateral lower extremities  Nail Bed Color: Pink.  Pulses:   Apical Pulse: Regular. Strong. Rate is .   Patient does not have a pacemaker.   Patient does not have a defibrillator.    CARDIOPULMONARY  Lung Sounds:   Upper lobes. Clear.   Lower lobes. Clear.  Type of Respirations: Regular.  Cough: No cough noted.  Respiratory Support: The patient does not require any respiratory support.  Respiratory Equipment: None.    INTEGUMENTARY  Skin:  Temperature: Warm  Turgor: Normal for age  Moisture: Dry  Color of skin: Normal for Race/Ethnicity  Capillary Refill: Less than 3 seconds  Wounds/Incisions:       Back  incision dressing D/C/I  Braden Scale for Predicting Pressure Sore Risk: Sensory Perception: No  impairment  Moisture: Rarely moist  Activity: Chairfast  Mobility: Slightly limited  Nutrition:  Adequate  Friction and Shear: Potential problem  Braden Score: 18  Level of Risk: At risk (15-18)   Pressure Relief every 30 minutes while in wheelchair   Turn patient every 2 hours   Assist patient to the bathroom every 2 hours    GASTROINTESTINAL  Abdomen: Soft. Nontender.  Bowel Sounds:  Active bowel sounds audible in all four quadrants.  Date of Last Bowel Movement:  11/17/15  Current Bowel Pattern:  Incontinent   No Problems/Complaints with Bowel Elimination Assessed.    GENITOURINARY  Current Bladder Pattern: Continent  Color:  Yellow Clear   Patient denies problems with urination and/or catheter.    Bowel and Bladder Output:                       Bladder (# only)             Bowel (# only)  Number of Episodes  Continent            2                            0  Incontinent          0                             1    MUSCULOSKELETAL  Upper Body: WNL  Lower Body: BLE weakness, RLE limited mobility due to knee pain    Functional Measures      BLADDER MANAGEMENT - LEVEL OF ASSIST: Bladder Score = 5.  Patient is  supervision/set-up for bladder management, requiring: Emptying equipment.  Patient requires the following assistive device(s): Urinal.    BLADDER ACCIDENTS THIS SHIFT:  0 . Patient has not had an accident but used a  urinal this assessment.    BOWEL MANAGEMENT - LEVEL OF ASSIST: Bowel Score = 1. Patient performs less than  25% of tasks and requires total assistance for bowel management. Helper provides  total assist to completely apply and remove brief.    BOWEL ACCIDENTS THIS SHIFT: 0 . Patient has not had an accident, but used a  stool softener.    MEMORY: Memory Score = 7, Independent.  Patient is completely independent for  memory.  There are no activity limitations.    PROBLEM SOLVING: Problem Solving Score = 7, Independent.  Patient makes  appropriate decisions in order to solve complex problems.  Patient is completely  independent for problem solving.  There are no activity limitations.    SOCIAL INTERACTION: Social Interaction Score = 7, Independent. Patient is  completely independent for social interaction.  There are no activity  limitations.    EXPRESSION: Vocal expression is the usual mode. Expression Score = 7,  Independent.  Patient expresses complex/abstract information in their primary  language.  Patient is completely independent for vocal expression.  There are no  activity limitations.    COMPREHENSION: Auditory comprehension is the usual mode. Comprehension Score =  7, Independent.  Patient comprehends complex/abstract information in their  primary language.  Patient is completely independent for auditory comprehension.   There are no activity limitations.    Education Provided:  No education provided this session.    Discharge: Patient is not being discharged at this time.    Long Term Goals: 1.  Patient will call for help 100% of time before getting OOB  inorder to  avoid fall during his recovery time   2. Patient will verbalize pain 2/10 before and during therapy with the help of  current pain medication   3. Patient's surgica incision will heal with no complication   4. Patient will identify at least 2 sideffects of his medications inorder to  get prompt intervention  2 weeks from 11/18/15  Short Term Goals: 1. Patient will call for help 100% of time before getting OOB  inorder to avoid fall during his recovery time   2. Patient will verbalize pain 2/10 before and during therapy with the help of  current pain medication   3. Patient will identify at least 2 sideffects of 75% his medications inorder  to get prompt intervention  1 week from 11/18/15    PROGRESS TOWARD GOALS: Pt is working on goals    PLAN: Nursing Specific Interventions  Medical Condition Management. Medication Management. Pain Management. Skin  Management. Wound Management.  Continue with the current Nursing Plan of Care.    TEAM CARE PLAN  Identified problems from team documentation:      Add/Update Problems from this assessment:  No updates at this time.    Please review Integrated Patient View Care Plan Flowsheet for Team identified  Problems, Interventions, and Goals.    Signed by: [address] [place], RN 11/19/2015 5:00:00 AM

## 2015-11-19 NOTE — Rehab Evaluation ([facility]) (Signed)
Matthew [name]  MRN: [id number]  Account: [id number]  Session Start: 11/19/2015 10:00:00 AM  Session Stop: 11/19/2015 11:00:00 AM    Physical Therapy  Inpatient Rehabilitation Eval    Rehab Diagnosis: Degenerative lumbar spinal stenosis, Spondylolisthesis of  lumbar region, s/p fusion L4-S1  Demographics:            Age: 74Y            Gender: Male  Primary Language: English  Past Medical History: Hypertension controlled  Syncopal episodes  approx 2012  1x episode - had negative cardiac work-up - no episodes sinice  BPH (benign prostatic hypertrophy)  2007  w/ hx of obstruction - s/p suprapubic prostatectomy  History of pancreatitis  2007  had microscopic stones - ERCP w/ stent done  Spinal stenosis  Spondylolisthesis  Fibromyalgia  in remission  Gastroesophageal reflux disease  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 male admitted to [facility] 11/10/15 with hx chronic  pancreatitis, BPH, HTN who had chest pain before lumbar laminectomy and fusion  on 12/1 by Dr. Marjo Bicker. He had an EKG during the episode that showed no ST  elevations or LBBB. He proceeded with surgery. He states the pain was sharp,  left lower chest / left upper quadrant, and lasted /R/2 minutes. He denies chest  pain, shortness of breath, but notes acid reflux. These symptoms are sudden  onset, moderate intensity, without alleviating factors.  Surgery 12/1:  Lumbosacral fusion  Chest Pain- Etiology unclear however resolved without intervention after 2  minutes and has not recurred   EKG without acute ischemic changes, troponin negative X 1, will trend to rule  out ACS  Does note history of acid reflux which may have contributed to chest pain v  atypical in  etiology   CXR does not show infiltrate or other acute pathology, radiology read pending  No recurrence of chest pain after resolution.   Denies shortness of breath,  abdominal pain, nausea, vomiting, or reflux at this time.  States he is doing  well now.  Plan of care discussed with family at bedside.  12/3- Nausea/Vomiting- Likely due to recent surgery, acid reflux,  Restarted on  home PPI  12/4- Abdominal x-rays show ileus, no evidence of obstruction, will obtain  follow up per radiology recommendations, LFTs and Lipase without significant  acute abnormality, Already ruled out for ACS as above  11/14/15- Follow up abdominal x-rays shows mildly improved since the prior study  favoring a resolving ileus. Clear liquid diet.  Doppler to rule out DVT RLE for  R knee pain 10/10.  RESULTS:  No evidence of deep venous thrombosis involving  the right  lower extremity.  Right Knee effusion with limited range of motion, fevers, and  leukocytosis, Orthopedics performed joint aspiration 12/6: negative for  crystals, WBC 20K, Fluid Cx NGTD,  Knee x-rays show significant effusion  possibly due to inflammatory arthropathy, Surgical site appears C/D/I  Blood cx obtained, UA shows hematuria (possibly due to traumatic cath) and not  consistent with UTI, CXR shows developing right basal infiltrate however patient  without clinical signs/symptoms of PNA (  no cough, sputum production, shortness  of breath)  12/7-Patient complaining of worsening neck pain.  On eval by PT patient with  significant difficulty with passive ROM of Cervical neck.  Mild TTP along spiny  process.  MRI c spine pending  11/17/15-Neck Pain: Hx of Cervical fusion. MRI c spine 12/8 no acute findings.  Will continue symptomatic treatment.  Afebrile overnight.  12/9-Pt participating in therapy and will benefit from acute rehab @ [facility].   Pt  afebrile, medically stable to transfer today.   Date of Onset: 11/10/15   Date of Admission: 11/18/2015 2:21:00 PM  Premorbid  Functional Level: Pt lives alone.  Pt was using a RW for 2 months  prior to surgery due to back pain.  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  Home Environment: Pt lives in a single familty home with 2 steps to enter.  Upon  discharge pt will go to his daughter's house locally which has 2 steps to enter  and then a flight of stairs to the bedroom level.    Medications and Allergies: Significant rehabilitation considerations:   See EPIC for details.  Rehabilitation Precautions/Restrictions:   Fall, skin and pressure ulcer, spinal precautions    SUBJECTIVE  Patient/Caregiver Goals:  Patient's functional goals: "To be able to walk alone  without pain."  Pain: Patient currently has pain.  Location: 1. R knee,  2. low back below incision,  3. L posterior neck /T/ L upper back  Type: Acute  Quality: Sharp.  Pain Scale: Numeric.  Patient reports a pain level of 6 out of 10.  Patient's acceptable level of pain 0 out of 10.   Interferes with physical activity. appetite.  Pain is alleviated by: Unknown at this time.  Pain is exacerbated by: Touch of tender areas exacerbates pain. Repositioned  patient.  Pain Reassessment:  Response to Pain Intervention: Pt with no report of increased pain with  functional mobility for evaluation.  Pt reported pain was a constant 6/10,  regardless of activity.  Pt reports hypersensitivity to touch in painful areas.  Post Intervention Pain Quality:  Sharp.  Patient Reports Post Intervention Pain Level of: 6 out of 10  Pain Acceptable: No: Continue with pain medications, consider trialing  modalities to reduce pain and mobility for reduced edema    OBJECTIVE  General Observations: Pt recieved dressed in pajamas, reclined in bed, awaiting  therapy.  Understanding of Current Condition: Pt appears to understand his functional  status and can state spinal precautions.   Pt with lots of questions about his  pain, swelling in his R knee and the  fever he had while in the hospital.  Vital Signs:                       Current Value                Previous Value  Vitals  Time                 1030                         -  Position/Activity    seated after ambulation      -  BP Systolic          83  125/68  BP Diastolic         48                           -  Pulse                114                          84  Respirations         -                            16  Prior to ambulation: 94/66  Upon assessment of 83/48, pt returned to unit to consult with MD /T/ RN.    Communication/Cognition: Pt appears appropriately cautious about spinal  precuations during movement.  Pt very focused on his pain and the unknown  etiology of his pain/edema symptoms.  Sensation: Grossly WFL in B LEs.  Range of Motion: Grossly WFL B LEs, except:  R knee limited into extension by  approx. 10 degrees due to edema /T/ pain.  End range flexion of R knee not  attempted due to pain and stiffness.  Strength/Motor Control: Gentle MMT attempted on L LE DF caused increased back  pain and was therefore held.  Pt presents with ability to perform anti-gravoity  movements in all extremities at all joints in LE.  Balance: Pt is able to perform static standing balance wihtout UE support with  stand by assist.  Due to pt's spinal precautions and pain, dynamic standing  balance assessment was held at this time.    Therapeutic/Functional Mobility:            Bed Mobility: Pt is mod A for supine to sit on an almost flat bed  (slight elevated for pt comfort).  Pt mod A for sit to supine for B LEs, and mod  A for scooting in bed.    Please see Functional Measures for additional Mobility details.    Interventions: None provided today.    Interdisciplinary Educational Needs and Learning Preferences:       Learning Preference: The patient's preferred learning method is:  Explanation.  The patient's preferred learning method is: Demonstration.  The patient's preferred learning  method is: Programme researcher, broadcasting/film/video.       Barriers to Learning:  Pain       Learning Needs: Functional activities/mobility, Pain management, Plan of  care, Precautions, Rehabilitation techniques and procedures    Education Provided: Bed mobility.       Audience: Patient.       Mode: Explanation.  Demonstration.       Response: Needs practice.  Needs reinforcement.    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  Pt presents with pain, decreased ROM, decreased  strength, decreased balance, and decreased endurance which negatively impact his  bed mobility, transfers, ambulation and stair negotiation.  Pt is anticipated to  make excellent gains, especially once pain control is improved.  Rehab Potential: Able to participate in an intensive inpatient interdisciplinary  rehabilitation program, Good premorbid functional status, Good premorbid medical  status, Living in the community premorbidly  Barriers to Progress/Discharge: Poor pain tolerance    Functional Measures  TRANSFERS BED/CHAIR/WHEELCHAIR: Bed/chair/wheelchair Transfer Score = 3.  Patient performs 50-74% of effort and requires moderate assistance (some  lifting)  for transferring to and from the bed/chair/wheelchair, including  assist lifting both legs.  Patient requires the following assistive device(s): Walker.  Bed rails.  Arm rest. Pt mod A for supine to/from sit due to pain and spinal precuations.  Pt min A to CGA for sit to stand to RW depending on seat height.  Pt CGA for stand pivot transfer with RW.    LOCOMOTION WHEELCHAIR:   Wheelchair did not occur. Due to pt's pain and fatigue, wheelchair mobility not  attempted this session.    LOCOMOTION WALK:   Walk Distance Scale = 1.  Distance walked is less than 50 feet. Walk Score = 1.   Incidental assistance with lifting, contact guard or steadying was  provided.  Patient performs 75% or more of effort and requires minimal contact assistance  for walking. Patient walked a distance of 14 feet. Rolling walker.   Wheelchair follow Pt ambulates with a slow cadence, reciprocal gait, forward  head posture.  Pt becomes shaky as ambulation distance increases, unclear  whether this is due to pain or fatigue or a combination.  No evidence of R knee  buckling this session, despite pt's concerns about this possibilty,    LOCOMOTION STAIRS: Stairs Score = 1.  Incidental assistance with lifting or  lowering, contact guard or steadying was provided. Patient requires/performs 75%  or more of effort and minimal contact assistance with negotiating stairs.  Patient negotiated 1 stairs.  Patient requires the following assistive device(s):  Handrail(s). Pt ascended 1  step, leading with the L LE and descended backwards leading with the R LE (per  suggestion of PT due pain management).    COMPREHENSION: Auditory comprehension is the usual mode. Comprehension Score =  7, Independent.  Patient comprehends complex/abstract information in their  primary language.  Patient is completely independent for auditory comprehension.   There are no activity limitations.    EXPRESSION: Vocal expression is the usual mode. Expression Score = 7,  Independent.  Patient expresses complex/abstract information in their primary  language.  Patient is completely independent for vocal expression.  There are no  activity limitations.    SOCIAL INTERACTION: Social Interaction Score = 7, Independent. Patient is  completely independent for social interaction.  There are no activity  limitations.    PROBLEM SOLVING: Problem Solving Score = 7, Independent.  Patient makes  appropriate decisions in order to solve complex problems.  Patient is completely  independent for problem solving.  There are no activity limitations.    MEMORY: Memory Score = 7, Independent.  Patient is completely independent for  memory.  There  are no activity limitations.    PAI Expected Mode of Locomotion at Discharge: The expected mode of most  frequently used locomotion, at discharge, is expected to be walking.    IRFPAI Goals    Bed, Chair, Wheelchair Transfers: 6  Mode of Locomotion: W  Walk/Wheelchair: 6  Stairs: 6    CARE Tool  Mobility Performance:   Roll Left and Right: Helper does less than half the effort. Helper lifts, holds  or supports trunk or limbs but provides less than half the effort.   Sit to Lying: Helper does less than half the effort. Helper lifts, holds or  supports trunk or limbs but provides less than half the effort.   Lying to Sitting on Side of Bed: Helper does less  than half the effort. Helper  lifts, holds or supports trunk or limbs but provides less than half the effort.   Sit to Stand: Helper does less than half the effort. Helper lifts, holds or  supports trunk or limbs but provides less than half the effort.   Chair/Bed to Chair Transfer: Helper does less than half the effort. Helper  lifts, holds or supports trunk or limbs but provides less than half the effort.   Toilet Transfer Not attempted due to medical or safety concerns.   Car Transfer: Not attempted due to medical or safety concerns.    Patient Walks: Yes   Walk 10 Feet: Helper does less than half the effort. Helper lifts, holds or  supports trunk or limbs but provides less than half the effort.   Walk 50 Feet With 2 Turns: Not attempted due to medical or safety concerns.   Walk 150 Feet: Not attempted due to medical or safety concerns.   Walking 10 Feet on Uneven Surfaces: Not attempted due to medical or safety  concerns.   1 Step Over Curb or Up/Down Stair: Helper does less than half the effort.  Helper lifts, holds or supports trunk or limbs but provides less than half the  effort.   4 Steps Up and Down, With/Without Rail: Not attempted due to medical or safety  concerns.   12 Steps Up and Down, With/Without Rail: Not attempted due to medical or  safety  concerns.   Picking up an Object: Not attempted due to medical or safety concerns.    Uses Wheelchair/Scooter: Yes  Wheel 50 Feet with Two Turns: Not attempted due to medical or safety concerns.  Type of Wheelchair Used: Manual  Wheel 150 Feet: Not attempted due to medical or safety concerns.  Type of Wheelchair Used: Manual  Mobility Discharge Goals (not met at this time):   Lying to Sitting on Side of Bed: Patient completes the activities by  him/herself with no assistance from a helper.   Walk Discharge Goals:   Walk 150 Feet: Patient completes the activities by him/herself with no  assistance from a helper.    Short Term Goals:  Time frame to achieve short term goal(s): 1 week from initial  eval on 11/19/15.       1. Pt will perform supine to sit via log rolling with min A.       2. Pt will ambulate 50 feet with RW, CGA.       3. Pt will ascend/descend 4 stairs with 2 handrails with CGA.       4. Pt will perform stand pivot transfers with RW and supervision.  Long Term Goals:   Time frame to achieve long term goal(s): 2 weeks from initial  eval on 11/19/15.       1. Pt will peform supine to/from sit on flat bed with mod I using external  support if needed for increased safety and independence upon discharge.       2. Pt will perform transfers mod I with most approrpriate assistive device  for increased safety and independence upon discharge.       3. Pt will perform ambulation over level surfaces with most appropriate  assistive device x 150 feet mod I for increased safety and independence upon  discharge.       4.  Pt will ascend/descend a flight of stairs mod I with one hand rail to  access bedroom level of his daughter's home upon discharge.  5. Pt and his family will particiapte in needed education and training  sessions to maximize pt safety and independence upon discharge.    Risks/Benefits of Rehabilitation Discussed with Patient/Caregiver: Yes.  Recommendations/Goals for Rehabilitation  Discussed with Patient/Caregiver: Yes.    PLAN  Physical Therapy Plan: Physical Therapy is recommended.  Recommended Frequency/Duration/Intensity: 60-120 minutes per day, 5-6 days per  week for 2 weeks from initial eval on 11/19/15.  Activities Contributing Toward Care Plan: therapeutic exercise, therapeutic  activities, balance training, pain management (including modalities), bed  mobility training, precautions training, ambulation training, stair negotiation,  DME assessment, discharge planning in a multidisciplinary team  The patient is not appropriate for treatment in group format.    Team Care Plan  Please review Integrated Patient View Care Plan Flowsheet for Team identified  Problems, Interventions, and Goals    Identified problems from team documentation:      Identified problems from this assessment:     Mobility : Pt will perform majority of mobility at a mod I level upon discharge  with most appropriate assisstive device.    Discipline:  Physical Therapy    3 Hour Rule Minutes: 60 minutes of PT treatment this session count towards  intensity and duration of therapy requirement. Patient was seen for the full  scheduled time of PT treatment this session.  Therapy Mode Minutes: Individual: 60 minutes.    Signed by: Halina Maidens, PT 11/19/2015 11:00:00 AM

## 2015-11-19 NOTE — Rehab Progress Note ([facility]) (Signed)
NAMECAMDEN KNOTEK  MRN: [id number]  Account: [id number]  Session Start: 11/18/2015 12:00:00 AM  Session Stop: 11/18/2015 12:00:00 AM    SEVERE SEPSIS SCREEN  INFECTION:  Patient has no indication of infection.  Negative Sepsis Screen.  If you are unable to assess a system's dysfunction because you do not have labs,  or the labs you have are not current (within 24 hours), call physician and  request and order for the lab tests needed.    Signed by: Jesse Fall, RN 11/18/2015 8:00:00 PM

## 2015-11-19 NOTE — Progress Notes (Signed)
IRF Physiatry Attending Face to Face Progress Note    Functional Status/Update:  I reviewed patient's therapy notes to assess functional status and ongoing need for therapies.  Of note,  Day # 1 REHAB.  Evals in progress.    Bilateral LE venous dopplers negative for DVT.      Subjective:  Rounding with nurse Tigist.  Pt has pruritic and papular rash on his back.  Rx: hydrocortisone 1% cream.      D/W PT:  Pt orthostatic with standing.  On Lisinopril 10 mg and Norvasc 5 mg daily.  Reduce dosage to 5 mg and  2.5 mg respectively.        Objective:   Vitals BP 125/68 mmHg  Pulse 84  Temp(Src) 97.8 F (36.6 C) (Oral)  Resp 16  Ht 1.778 m (5\' 10" )  Wt 71.895 kg (158 lb 8 oz)  BMI 22.74 kg/m2  SpO2 94%    Physical Examination:  Appears well.  In no acute distress. Normal body habitus.  Anicteric sclerae. Conjunctivae non-injected.  Symmetric facies. Moist mucous membranes.  Heart rate and rhythm are regular. No murmurs/rubs/gallops.  Lungs are clear to auscultation bilaterally. No wheezes, rales, or rhonchi.  Soft. Non-tender. Normoactive bowel sounds.  Alert Pleasant and cooperative    New Labs    Results     Procedure Component Value Units Date/Time    Comprehensive metabolic panel [[id number]]  (Abnormal) Collected:  11/19/15 0643    Specimen Information:  Blood Updated:  11/19/15 0724     Glucose 103 (H) mg/dL      BUN 12 mg/dL      Creatinine 0.8 mg/dL      Sodium 147 mEq/L      Potassium 3.9 mEq/L      Chloride 102 mEq/L      CO2 30 (H) mEq/L      Calcium 8.9 mg/dL      Protein, Total 4.9 (L) g/dL      Albumin 2.5 (L) g/dL      AST (SGOT) 12 U/L      ALT 25 U/L      Alkaline Phosphatase 141 (H) U/L      Bilirubin, Total 1.0 mg/dL      Globulin 2.4 g/dL      Albumin/Globulin Ratio 1.0      Anion Gap 7.0     GFR [[id number]] Collected:  11/19/15 0643     EGFR >60.0 Updated:  11/19/15 0724    CBC and differential [[id number]]  (Abnormal) Collected:  11/19/15 0643    Specimen Information:  Blood from Blood Updated:   11/19/15 0704     WBC 7.86 x10 3/uL      Hgb 12.6 (L) g/dL      Hematocrit 29.5 (L) %      Platelets 350 x10 3/uL      RBC 4.00 (L) x10 6/uL      MCV 93.0 fL      MCH 31.5 pg      MCHC 33.9 g/dL      RDW 12 %      MPV 9.1 (L) fL      Neutrophils 62 %      Lymphocytes Automated 23 %      Monocytes 12 %      Eosinophils Automated 2 %      Basophils Automated 0 %      Immature Granulocyte 2 %      Nucleated RBC 0 /100 WBC  Neutrophils Absolute 4.87 x10 3/uL      Abs Lymph Automated 1.83 x10 3/uL      Abs Mono Automated 0.95 x10 3/uL      Abs Eos Automated 0.18 x10 3/uL      Absolute Baso Automated 0.03 x10 3/uL      Absolute Immature Granulocyte 0.14 (H) x10 3/uL           Current medications:  Current Facility-Administered Medications   Medication Dose Route Frequency   . [START ON 11/20/2015] amLODIPine  2.5 mg Oral Daily   . atorvastatin  10 mg Oral QHS   . docusate sodium  100 mg Oral BID   . hydrocortisone   Topical BID   . [START ON 11/20/2015] lisinopril  5 mg Oral Daily   . morphine  15 mg Oral Q8H   . multivitamin  1 tablet Oral Daily   . pantoprazole  40 mg Oral Daily   . pramipexole  0.25 mg Oral Q12H       Assessment: 74 y.o. male with lumbar spinal stenosis.    Plan:  REHAB: Continue comprehensive and intensive inpatient rehab program, including:  Physical therapy 60-120 min daily, 5-6 times per week  Occupational therapy  60-120 min daily, 5-6 times per week  Therapeutic recreation  Psychology  Dietician consultation  Case management  Rehabilitation nursing    Will continue to address the following impairments and issues:   Mobility, ADLs, Impaired strength, Impaired ROM, Impaired endurance, Adherence to precautions, Caregiver training, Medication management, Adjustment to disability, Community support and resources, Impaired coordination, Impaired balance and Coping strategies.

## 2015-11-19 NOTE — Rehab Progress Note ([facility]) (Signed)
NAME: Matthew [name]  MRN: [id number]  Account: [id number]  Session Start: 11/19/2015 1:00:00 PM  Session Stop: 11/19/2015 2:00:00 PM    Physical Therapy  Inpatient Rehabilitation Progress Note - Brief    Rehab Diagnosis: Degenerative lumbar spinal stenosis, Spondylolisthesis of  lumbar region, s/p fusion L4-S1  Demographics:            Age: 8Y            Gender: Male  Rehabilitation Precautions/Restrictions:   Fall, skin and pressure ulcer, spinal precautions    SUBJECTIVE  Patient Report: "I am still having the issue with the hyper sense of smell, when  are we going to address that?"  Explained to pt that sense of smell was not  within the licensure or purview of a physical therapy license, but offered to  address pt's concerns about hyper-sensitivity to touch.  Pain: Patient currently has pain.  Patient reports a pain level of 6 out of 10. Repositioned patient.    OBJECTIVE    Vital Signs:                       Current Value                Previous Value  Vitals  Time                 1320                         1030  Position/Activity    seated                       seated after ambulation  BP Systolic          97                           83/48  BP Diastolic         58                           -  Pulse                111                          114  Respirations         -                            16    Interventions:       Therapeutic Activities:  Therapy session focused on ambulation and LE  strengthening/ROM for increased safety and indepednence upon discharge.    Pt recevied in bed, mod A for supine to sit, CGA for sit to stand, min A for  ambulation in room to wheelchair.  Pt visibly more fatigued this session compared to morning session today.  Pt  continues to report unchanging 6/10 pain.  Pt ambulated 10 feet with a RW, min  A, but due to visible fatigue, ambulation halted by P.T.  Seated LE exercises  included ankle pumps, LAQs with a 3 second hold Bly x 10 repetitions for  improved ROM of the R  knee.    Pt reports (and RN confirms) that MD reduced hi BP meds, so pt will hopefully  have decreased dizziness in future session with anti-gravity transition  movements.  Pt most concerned this session about painful posterior/lateral neck  and upper back which correlates with the medial L upper trapezius region.  Pt  too tender to allow P.T. to attempt trigger point release, so pt encouraged to  apply pressure to his trigger points himself during the session, with slight  report of relief.  At end of session applied ice pack to area in effort to  reduce pain and tenderness.  Pt and RN instructed that pt was to remove ice back  in 20-30 minutes.  Pt left in bed, RN present to provide pain medication, needs  in reach.  Pain Reassessment:  Response to Pain Intervention: Pt with no increased pain due to movement.  Pt  with increased tenderness at trigger points in his upper L posterior quadrant.  Pain appears affected by cerival movement, and pt was agreeable that it is  likely of muscular origin.  Post Intervention Pain Quality:  Sharp. Stabbing.  Patient Reports Post Intervention Pain Level of: 6 out of 10  Pain Acceptable: No: Continue with pain medicaion, Attempted application of ice  pack this session if that does nor provide relief, pt might benefit from moist  hot pack or K-pad.    Education Provided:    Education Provided: Non-medicinal pain control options including positioning in  bed, ice and heat in an effort to control cervical pain. .       Audience: Patient.       Mode: Explanation.  Demonstration.       Response: Needs reinforcement.    ASSESSMENT  Pt presents with focus on pain.  Pain and fatigued limited his participation  this session and required extensive rest breaks.  Pt presents with decreased  endurance, decreased strength, decreased ROM, pain, and decreased balance which  negatively impact his bed mobility, transfers, ambulation and stair negotiation.   Pt also continues to be focused on his  reports of post-surgical "hyper senses"  with his sense of smell being notably increased.    Functional Measures      TRANSFERS BED/CHAIR/WHEELCHAIR: Bed/chair/wheelchair Transfer Score = 3.  Patient performs 50-74% of effort and requires moderate assistance (some  lifting)  for transferring to and from the bed/chair/wheelchair, including  assist lifting both legs.  Patient requires the following assistive device(s): Walker.  Bed rails.  Arm rest.    LOCOMOTION WHEELCHAIR:   Wheelchair did not occur.    LOCOMOTION WALK:   Walk Distance Scale = 1.  Distance walked is less than 50 feet. Walk Score = 1.   Incidental assistance with lifting, contact guard or steadying was provided.  Patient performs 75% or more of effort and requires minimal contact assistance  for walking. Patient walked a distance of 10 feet. Rolling walker.   wheelchair follow    COMPREHENSION: Auditory comprehension is the usual mode. Comprehension Score =  7, Independent.  Patient comprehends complex/abstract information in their  primary language.  Patient is completely independent for auditory comprehension.   There are no activity limitations.    EXPRESSION: Vocal expression is the usual mode. Expression Score = 7,  Independent.  Patient expresses complex/abstract information in their primary  language.  Patient is completely independent for vocal expression.  There are no  activity limitations.    SOCIAL INTERACTION: Social Interaction Score = 7, Independent. Patient is  completely independent for social interaction.  There are no activity  limitations.    PROBLEM  SOLVING: Problem Solving Score = 7, Independent.  Patient makes  appropriate decisions in order to solve complex problems.  Patient is completely  independent for problem solving.  There are no activity limitations.    MEMORY: Memory Score = 7, Independent.  Patient is completely independent for  memory.  There are no activity limitations.    PLAN  Continued Physical Therapy is  recommended.  Recommended Frequency/Duration/Intensity: 60-120 minutes per day, 5-6 days per  week for 2 weeks from initial eval on 11/19/15.  Activities Contributing Toward Care Plan: therapeutic exercise, therapeutic  activities, balance training, pain management (including modalities), bed  mobility training, precautions training, ambulation training, stair negotiation,  DME assessment, discharge planning in a multidisciplinary team    3 Hour Rule Minutes: 60 minutes of PT treatment this session count towards  intensity and duration of therapy requirement. Patient was seen for the full  scheduled time of PT treatment this session.  Therapy Mode Minutes: Individual: 60 minutes.    Signed by: Halina Maidens, PT 11/19/2015 2:00:00 PM

## 2015-11-19 NOTE — Rehab Evaluation ([facility]) (Addendum)
Corrected 11/19/2015 8:02:55 PM    NAME: Matthew [name]  MRN: [id number]  Account: [id number]  Session Start: 11/19/2015 8:00:00 AM  Session Stop: 11/19/2015 9:00:00 AM    Occupational Therapy  Inpatient Rehabilitation Evaluation    Rehab Diagnosis: Degenerative lumbar spinal stenosis, Spondylolisthesis of  lumbar region  Demographics:            Age: 74Y            Gender: Male  Primary Language: English    Past Medical History: Hypertension controlled  Syncopal episodes  approx 2012  1x episode - had negative cardiac work-up - no episodes since  BPH (benign prostatic hypertrophy)  2007  w/ hx of obstruction - s/p suprapubic prostatectomy  History of pancreatitis  2007  had microscopic stones - ERCP w/ stent done  Spinal stenosis  Spondylolisthesis  Fibromyalgia  in remission  Gastroesophageal reflux disease  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 male admitted to [facility] 11/10/15 with hx chronic  pancreatitis, BPH, HTN who had chest pain before lumbar laminectomy and fusion  on 12/1 by Dr. Marjo Bicker. He had an EKG during the episode that showed no ST  elevations or LBBB. He proceeded with surgery. He states the pain was sharp,  left lower chest / left upper quadrant, and lasted /R/2 minutes. He denies chest  pain, shortness of breath, but notes acid reflux. These symptoms are sudden  onset, moderate intensity, without alleviating factors.  Surgery 12/1:  Lumbosacral fusion  Chest Pain- Etiology unclear however resolved without intervention after 2  minutes and has not recurred   EKG without acute ischemic changes, troponin negative X 1, will trend to rule  out ACS  Does note history of acid reflux which may have contributed to chest  pain v  atypical in etiology   CXR does not show infiltrate or other acute pathology, radiology read pending  No recurrence of chest pain after resolution.   Denies shortness of breath,  abdominal pain, nausea, vomiting, or reflux at this time.  States he is doing  well now.  Plan of care discussed with family at bedside.  12/3- Nausea/Vomiting- Likely due to recent surgery, acid reflux,  Restarted on  home PPI  12/4- Abdominal x-rays show ileus, no evidence of obstruction, will obtain  follow up per radiology recommendations, LFTs and Lipase without significant  acute abnormality, Already ruled out for ACS as above  11/14/15- Follow up abdominal x-rays shows mildly improved since the prior study  favoring a resolving ileus. Clear liquid diet.  Doppler to rule out DVT RLE for  R knee pain 10/10.  RESULTS:  No evidence of deep venous thrombosis involving  the right  lower extremity.  Right Knee effusion with limited range of motion, fevers, and  leukocytosis, Orthopedics performed joint aspiration 12/6: negative for  crystals, WBC 20K, Fluid Cx NGTD,  Knee x-rays show significant effusion  possibly due to inflammatory arthropathy, Surgical site appears C/D/I  Blood cx obtained, UA shows hematuria (possibly due to traumatic cath) and not  consistent with UTI, CXR shows developing right basal infiltrate however patient  without clinical signs/symptoms of PNA (no cough, sputum production, shortness  of breath)  12/7-Patient complaining of worsening neck pain.  On eval by PT patient with  significant difficulty with passive ROM of Cervical neck.  Mild TTP along spiny  process.  MRI c spine pending  11/17/15-Neck Pain: Hx of Cervical fusion. MRI c spine 12/8 no acute findings.  Will continue symptomatic treatment.  Afebrile overnight.  12/9-Pt participating in therapy and will benefit from acute rehab @ [facility].   Pt  afebrile, medically stable to transfer today.   Date of Onset: 11/10/15   Date of Admission: 11/18/2015 2:21:00  PM  Premorbid Functional Level: Pt reports lives alone in 1 level home, independent  with ADL/IADL, drives and was amb with no AD until recent increased pain in back  - has been using crutches, cane and RW.  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 Has Business degree.  Home Environment: 1 level home, no stairs, low step in tub with several grab  bars. Pt report will be staying with daughter upon d/c initially - 2 level home  with full FOS to bed/bath were he will be staying. He is not sure if they have a  tub or shower. 2-3 STE house    Medications and Allergies: Significant rehabilitation considerations:   Refer to Epic  Rehabilitation Precautions/Restrictions:   Fall, skin and pressure ulcer, spinal precaution    SUBJECTIVE  Patient Report: "It has been a bit of a roller coaster."  Patient/Caregiver Goals:  Patient's functional goals: To be able to walk alone  with out pain  Pain: Patient currently has pain.  Location: back  Type: Acute  Quality: Throbbing.  Pain Scale: Numeric.  Patient reports a pain level of 5 out of 10.  Patient's acceptable level of pain 2 out of 10.   Interferes with physical activity.  Pain is alleviated by: change of positions  Pain is exacerbated by: mobility and standing Repositioned patient.  Pain Reassessment:  Response to Pain Intervention:  Post Intervention Pain Quality:  Aching.  Patient Reports Post Intervention Pain Level of: 7 out of 10  Pain Acceptable: Yes    OBJECTIVE  General Observation: Pt chart reviewed. Nsg cleared. Pt rec'd supine in bed on  arrival. Pt agreeable to OT eval. Eval completed.  Understanding of Current Condition: Pt stated that pain limited his mobility;  good understanding and aware of hospital events and status.  Vital Signs:                        Current Value                Previous Value  Vitals  BP Systolic          -                            125/68  Pulse                -                             84  Respirations         -                            16  Cognition: Pt is A/T/O x3, pleasant and cooperative, appears intact with complex  understanding and organization of thoughts, actively participating in goal  setting and understanding rehab process, appears to have good safety awareness  with fxl transfers  Vision:  readers only; otherwise, WFL  Perception: WFL - no deficits noted    Upper Extremity Status   Tone: normal and WFL   Range of Motion: Bil UE WFL all planes   Fine Motor: WFL   Other:    Strength/Motor Control: Grossly 4+/5 all planes  Sensation: denies tingling and numbness bil UE; intact    Interventions: None provided today.    Interdisciplinary Educational Needs and Learning Preferences:       Learning Preference: The patient's preferred learning method is:  Explanation.  The patient's preferred learning method is: Demonstration.       Barriers to Learning: Mobility.       Learning Needs: Equipment, Pain management, Plan of care, Precautions,  Safety    Education Provided: Role of OT, rehab process, back precautions, fxl transfer  safety and AE tech, POC and d/c plan .       Audience: Patient.       Mode: Explanation.  Demonstration.       Response: Verbalized understanding.  Needs practice.  Needs reinforcement.    ASSESSMENT  Summary of Deficits and Prognosis: Pt presents to OT with a decline with  balance, standing tolerance, endurance, strength and fxl transfer, limited by  increased back and right knee pain impacting overall independence with self care  and fxl transfers below baseline. Pt reports independent at baseline with a  recent decline due to increased back pain impacting overall balance and ability  to ambulate. Pt will benefit from OT while in house to address deficits noted  above to max full functional independence with ADLs in prep for safe return  home. Pt reports lives alone; however will be returning to daughters home until  able to be alone. Anticipate  pt will required initial support for transition  home with DME/AE TBD.  Rehab Potential: Able to participate in an intensive inpatient interdisciplinary  rehabilitation program, Good family/social support, Good premorbid functional  status, Motivated  Barriers to Progress/Discharge: Poor pain tolerance    Functional Measures    EATING: Eating did not occur. .    GROOMING: Grooming Score = 5. Patient is supervision/set-up for grooming,  requiring: Setting out grooming equipment.  Patient requires the following assistive device(s) No assistive devices were  required.    BATHING: Patient bathed in shower. Bathing Score = 4.  Patient requires minimal  assistance for bathing, requiring steadying for balance only. Patient requires  the following assistive device(s): Grab bar/arm rest to maintain balance.    UPPER BODY DRESSING: Upper Body Dressing Score = 5. Patient is  supervision/set-up for upper body dressing, requiring: Gathering/setting out  clothes.  Patient requires the following assistive device(s):  No assistive devices were  required.    LOWER BODY DRESSING: Lower Body Dressing Score = 4.  Patient requires minimal  assistance for lower body dressing, requiring steadying for balance only.  Patient requires the following assistive device(s): No assistive devices were  required. cross over leg tech and UE support on RW for standing for CM    TOILETING: Toileting Score = 4.  Patient requires minimal assistance for  toileting, such as steadying for balance while cleansing or adjusting clothes.  Patient requires the following assistive device(s):  Adaptive device to maintain  balance.  TRANSFERS BED/CHAIR/WHEELCHAIR: Bed/chair/wheelchair Transfer Score = 4.  Patient performs 75% or more of effort and minimal assistance (little/incidental  help/lifting of one limb/steadying) for transferring to and from the  bed/chair/wheelchair, requiring: Hand placement. Contact guard.  Patient requires the following assistive  device(s): Walker.  Elevated head of bed.  Elevated bed/surface.    TRANSFER TOILET: Toilet Transfer Score = 4.  Patient performs 75% or more of  effort and minimal assistance (little/incidental help/steadying) for  transferring to and from the toilet/commode, requiring: Contact guard.  Patient requires the following assistive device(s):  Walker.  Grab bars.    TRANSFER SHOWER: Shower Transfer Score = 4. Patient performs 75% or more of  effort and minimal assistance (little/incidental help/lifting of one  limb/steadying) for transferring to and from the shower, requiring: Contact  guard. Hand placement. Walker.  Grab bars.  Shower chair.    COMPREHENSION: Both ( auditory and visual) modes of comprehension are used  equally. Comprehension Score = 6, Modified Independence.  Patient comprehends  complex/abstract information in their primary language, requiring: Additional  time.    EXPRESSION: Vocal expression is the usual mode. Expression Score = 7,  Independent.  Patient expresses complex/abstract information in their primary  language.  Patient is completely independent for vocal expression.  There are no  activity limitations.    SOCIAL INTERACTION: Social Interaction Score = 7, Independent. Patient is  completely independent for social interaction.  There are no activity  limitations.    PROBLEM SOLVING: Problem Solving Score = 6, Modified Independence.  Patient  makes appropriate decisions in order to solve complex problems with mild  difficulty but self-corrects. Patient's problem solving is mildly impacted by  Self correcting performance    MEMORY: Memory Score = 6, Modified Independence.  Patient is modified  independent for memory, requiring: Requires additional time.    IRFPAI Goals    Eating: 7  Grooming: 7  Bathing; 6  Upper Body Dressing: 7  Lower Body Dressing: 6  Mode of Bathing: S  Tub/Shower: 5  Toilet Transfers: 6    Short Term Goals:  Time frame to achieve short term goal(s):   1 week from initial  OT eval on 12/10       1. Pt will be S for LB ADL with AE prn to maintain back precautions and  reduce pain       2. Pt will be S for toilet transfer with LRD  Long Term Goals:   Time frame to achieve long term goal(s):   10-14 days from initial OT eval       1. Pt will be mod I with complete ADL/self care management with LHAE prn to  maintain back precautions and reduce pain.       2. Pt will be mod I for toilet transfer with LRD       3. Pt will be Supervision for shower/tub transfer with proper DME       4. Pt will be S for light home management tasks with LRD    CARE Tool  Mobility Performance:   Toilet Transfer Helper does less than half the effort. Helper lifts, holds or  supports trunk or limbs but provides less than half the effort.  Self Care Discharge Goals:   Lower Body Dressing: Patient completes the activities by him/herself with no  assistance from a helper.  Hearing, Speech, and Vision: Expression of Ideas and Wants: Expresses complex  messages without difficulty and with speech that is clear and  easy to  understand.  Understanding Verbal Content: Understands: Clear comprehension without cues or  repetitions.    Risks/Benefits of Rehabilitation Discussed with Patient/Caregiver: Yes.  Recommendations/Goals for Rehabilitation Discussed with Patient/Caregiver: Yes.    PLAN  Occupational Therapy Plan: Occupational Therapy is recommended.  Recommended Frequency/Duration/Intensity: 5-6days/wk, 60-120 min/day, 10 -14  days  Activities Contributing Toward Care Plan: balance, strengthening, ther ax,  self  care management, fl transfers, AE/DME training, Family training in prep for d/c  home  Patient is appropriate for treatment in gait or mobility group.    Team Care Plan  Please review Integrated Patient View Care Plan Flowsheet for Team identified  Problems, Interventions, and Goals.    Identified problems from team documentation:      Identified problems from this assessment:     Self Care Management : Pt  will be mod I for ADL and fxl transfer with LRD    Discipline:  Occupational Therapy    3 Hour Rule Minutes: 60 minutes of OT treatment this session count towards  intensity and duration of therapy requirement. Patient was seen for the full  scheduled time of OT treatment this session.  Therapy Mode Minutes: Individual: 60 minutes.    Signed by: Retia Passe, OTR/L 11/19/2015 9:00:00 AM

## 2015-11-20 NOTE — Progress Notes (Signed)
[facility] Physiatry Attending Brief Note    [x] No new events      Objective:  Vitals: BP 116/63 mmHg  Pulse 103  Temp(Src) 98.5 F (36.9 C) (Oral)  Resp 20  Ht 1.778 m (5\' 10" )  Wt 71.895 kg (158 lb 8 oz)  BMI 22.74 kg/m2  SpO2 96%      New labs   Results     ** No results found for the last 24 hours. **          Assessment/Plan: 74 y.o. male with Lumbar spinal stenosis [M48.06]    Continue comprehensive intensive inpatient rehab program. Medically stable.  Continue current management.

## 2015-11-20 NOTE — Rehab Progress Note ([facility]) (Signed)
Matthew [name]  MRN: [id number]  Account: [id number]  Session Start: 11/20/2015 12:00:00 AM  Session Stop: 11/20/2015 12:00:00 AM    Rehabilitation Nursing  Inpatient Rehabilitation Shift Assessment    Rehab Diagnosis: Degenerative lumbar spinal stenosis, Spondylolisthesis of  lumbar region, s/p fusion L4-S1  Demographics:            Age: 74Y            Gender: Male  Primary Language: English    Date of Onset:  11/10/15  Date of Admission: 11/18/2015 2:21:00 PM    Rehabilitation Precautions Restrictions:   Fall, skin and pressure ulcer, spinal precaution    Patient Report: ''My pain is controlled. I am happy for that"  Pain: Patient currently without complaints of pain.  Pain Reassessment: Pain was not reassessed as no pain was reported.  Patient/Caregiver Goals:  To be able to walk alone with out pain    NEURO  Orientation/Awareness: Alert and Oriented x4.  Speech: No deficits noted at this time.  Behavior: Cooperative.    MEDICATIONS  IV Access: No IV access.  Dialysis Access: Patient does not have dialysis access.      Elopement Risk Level Assessment Tool  Patient Criteria: Patient is not capable of leaving the unit.  Assessment is not  applicable.      RISK ASSESSMENT FOR FALLS/INJURY    MENTAL STATUS CRITERIA:   0 - None identified.  MENTAL STATUS TOTAL: 0    AGE CRITERIA:   75 - 76-75 years old  AGE TOTAL: 1    ELIMINATION CRITERIA:   3 - Toileting with Assistance.   ELIMINATION TOTAL: 3    HISTORY OF FALLS CRITERIA:   2 - Unknown History.  HISTORY OF FALLS TOTAL: 2    MEDICATIONS CRITERIA:   2 or more High Risk Medications (*see list below)   MEDICATIONS TOTAL: 2    PHYSICAL MOBILITY CRITERIA:   3 - Decreased balance reaction.   1 - Weakness/impaired physical mobility.   PHYSICAL MOBILITY TOTAL: 4    FALLS RISK ASSESSMENT TOTAL: 12    Patient's Fall Risk: TOTAL SCORE >10: High Risk    Falls Interventions:  Clutter removed and clear path to BR.  Call bell, phone, glasses, etc within reach.  Hourly  toileting/safety checks between 6am and 10pm, then every 2 hours.  Initiate Fall care plan and outcome.  Yellow "high risk" patient identification in place: wrist band, socks, chart  sticker, door sign.  Pt and family education.  Assistive devices at [facility].  Pharmacy review of meds.  Bed alarm    NUTRITION  Diet:   Type: Regular.  Food Consistency: Regular.  Liquid Consistency: Thin.      CARDIOVASCULAR     Bilateral lower extremities  Nail Bed Color: Pink.  Pulses:   Apical Pulse: Regular. Strong. Rate is 78.   Patient does not have a pacemaker.   Patient does not have a defibrillator.    CARDIOPULMONARY  Lung Sounds:   Upper lobes. Clear.   Lower lobes. Clear.  Type of Respirations:  Regular.  Cough:  No cough noted.  Respiratory Support:  The patient does not require any respiratory support.  Respiratory Equipment:  None. O2 sat 98% on room air.    INTEGUMENTARY  Skin:  Temperature: Warm  Turgor: Normal for age  Moisture: Dry  Color of skin: Normal for Race/Ethnicity  Capillary Refill: Less than 3 seconds  Wounds/Incisions:     Surgical Incision: Back incision. Length:  12 centimeter(s) with glue.  Drainage: Incision without drainage.  Odor:  No  Incision Care: Per protocol.       Blanchable redness on sacrum heeled.       Rt knee aspiration area healed.    Braden Scale for Predicting Pressure Sore Risk:  Sensory Perception: No  impairment  Moisture: Rarely moist  Activity: Chairfast  Mobility: Slightly limited  Nutrition: Adequate  Friction and Shear: Potential problem  Braden Score: 18  Level of Risk: At risk (15-18)   Pressure Relief every 30 minutes while in wheelchair   Turn patient every 2 hours   Assist patient to the bathroom every 2 hours    GASTROINTESTINAL  Abdomen:  Soft. Nontender.  Bowel Sounds:  Active bowel sounds audible in all four quadrants.  Date of Last Bowel Movement:  11/19/15  Current Bowel Pattern:  Continent   No Problems/Complaints with Bowel Elimination Assessed.    GENITOURINARY  Current  Bladder Pattern: Continent  Color:  Yellow Clear   Patient denies problems with urination and/or catheter.    Bowel and Bladder Output:                       Bladder (# only)             Bowel (# only)  Number of Episodes  Continent            3                            0  Incontinent          0                            0    MUSCULOSKELETAL  Upper Body: WNL  Lower Body: BLE weakness, RLE limited mobility due to knee pain    Functional Measures      BLADDER MANAGEMENT - LEVEL OF ASSIST: Bladder Score = 5.  Patient is  supervision/set-up for bladder management, requiring: Emptying equipment.  Patient requires the following assistive device(s): Urinal.    BLADDER ACCIDENTS THIS SHIFT:  0 . Patient has not had an accident but used a  urinal this assessment.    BOWEL MANAGEMENT - LEVEL OF ASSIST: Bowel Score = 6.  Patient is modified  independent for bowel management.  Patient did not have bowel movement.  Medication/intervention was provided.    BOWEL ACCIDENTS THIS SHIFT: 0 . Patient has not had an accident, but used a  stool softener.    COMPREHENSION: Auditory comprehension is the usual mode. Comprehension Score =  7, Independent.  Patient comprehends complex/abstract information in their  primary language.  Patient is completely independent for auditory comprehension.   There are no activity limitations.    EXPRESSION: Vocal expression is the usual mode. Expression Score = 7,  Independent.  Patient expresses complex/abstract information in their primary  language.  Patient is completely independent for vocal expression.  There are no  activity limitations.    SOCIAL INTERACTION: Social Interaction Score = 7, Independent. Patient is  completely independent for social interaction.  There are no activity  limitations.    PROBLEM SOLVING: Problem Solving Score = 7, Independent.  Patient makes  appropriate decisions in order to solve complex problems.  Patient is completely  independent for problem solving.  There  are no activity limitations.  MEMORY: Memory Score = 7, Independent.  Patient is completely independent for  memory.  There are no activity limitations.    Education Provided:   No education provided this session.    Discharge:  Patient is not being discharged at this time.    Long Term Goals: 1. Patient will call for help 100% of time before getting OOB  inorder to avoid fall during his recovery time   2. Patient will verbalize pain 2/10 before and during therapy with the help of  current pain medication   3. Patient's surgica incision will heal with no complication   4. Patient will identify at least 2 sideffects of his medications inorder to  get prompt intervention  2 weeks from 11/18/15  Short Term Goals: 1. Patient will call for help 100% of time before getting OOB  inorder to avoid fall during his recovery time   2. Patient will verbalize pain 2/10 before and during therapy with the help of  current pain medication   3. Patient will identify at least 2 sideffects of 75% his medications inorder  to get prompt intervention  1 week from 11/18/15    PROGRESS TOWARD GOALS:  Pt calls for help, his pain managed well with the  current pain medications, identified sideffects of his pain medication, needs  reinforcement on SE of other meds.    PLAN: Nursing Specific Interventions  Medical Condition Management. Medication Management. Pain Management. Skin  Management. Wound Management.   Continue with the current Nursing Plan of Care.    TEAM CARE PLAN  Identified problems from team documentation:      Add/Update Problems from this assessment:  No updates at this time.    Please review Integrated Patient View Care Plan Flowsheet for Team identified  Problems, Interventions, and Goals.    Signed by: Lowella Dell, RN 11/20/2015 8:55:00 AM

## 2015-11-20 NOTE — Rehab Progress Note ([facility]) (Addendum)
Corrected 11/20/2015 12:22:36 AM    NAME: Matthew [name]  MRN: [id number]  Account: [id number]  Session Start: 11/19/2015 12:00:00 AM  Session Stop: 11/19/2015 12:00:00 AM    Rehabilitation Nursing  Inpatient Rehabilitation Shift Assessment    Rehab Diagnosis: Degenerative lumbar spinal stenosis, Spondylolisthesis of  lumbar region  Demographics:            Age: 74Y            Gender: Male  Primary Language: English    Date of Onset:  11/10/15  Date of Admission: 11/18/2015 2:21:00 PM    Rehabilitation Precautions Restrictions:   Fall, skin and pressure ulcer, spinal precaution    Patient Report: " I feel okay".  Pain: Patient currently without complaints of pain.  Pain Reassessment: Pain was not reassessed as no pain was reported.  Patient/Caregiver Goals:  To be able to walk alone with out pain    NEURO  Orientation/Awareness: Alert and Oriented x3.  Speech: Clear.  Behavior: Cooperative.    MEDICATIONS  IV Access: No IV access.  Dialysis Access: Patient does not have dialysis access.      Elopement Risk Level Assessment Tool  Patient Criteria: Patient is not capable of leaving the unit.  Assessment is not  applicable.      RISK ASSESSMENT FOR FALLS/INJURY    MENTAL STATUS CRITERIA:   0 - None identified.  MENTAL STATUS TOTAL: 0    AGE CRITERIA:   22 - 46-78 years old  AGE TOTAL: 1    ELIMINATION CRITERIA:   3 - Toileting with Assistance.   ELIMINATION TOTAL: 3    HISTORY OF FALLS CRITERIA:   2 - Unknown History.  HISTORY OF FALLS TOTAL: 2    MEDICATIONS CRITERIA:   2 or more High Risk Medications (*see list below)   MEDICATIONS TOTAL: 2    PHYSICAL MOBILITY CRITERIA:   3 - Decreased balance reaction.   1 - Weakness/impaired physical mobility.   PHYSICAL MOBILITY TOTAL: 4    FALLS RISK ASSESSMENT TOTAL: 12    Patient's Fall Risk: TOTAL SCORE >10: High Risk    Falls Interventions: Clutter removed and clear path to BR.  Call bell, phone, glasses, etc within reach.  Hourly toileting/safety checks between 6am and 10pm,  then every 2 hours.  Initiate Fall care plan and outcome.  Yellow "high risk" patient identification in place: wrist band, socks, chart  sticker, door sign.  Pt and family education.  Assistive devices at [facility].  Pharmacy review of meds.  Bed alarm    NUTRITION  Diet:  Type: Regular.  Food Consistency: Regular.  Liquid Consistency: Thin.      CARDIOVASCULAR     Bilateral lower extremities  Nail Bed Color: Pink.  Pulses:   Apical Pulse: Regular. Strong. Rate is 90 .   Patient does not have a pacemaker.   Patient does not have a defibrillator.    CARDIOPULMONARY  Lung Sounds:   Upper lobes. Clear.   Lower lobes. Clear.  Type of Respirations: Regular.  Cough: No cough noted.  Respiratory Support: The patient does not require any respiratory support.  Respiratory Equipment: None.    INTEGUMENTARY  Skin:  Temperature: Warm  Turgor: Normal for age  Moisture: Dry  Color of skin: Normal for Race/Ethnicity  Capillary Refill: Less than 3 seconds  Wounds/Incisions:     Wound/incision not assessed this shift.   Dressing was changed by day shift RN  D/C/I  Braden Scale for Predicting Pressure  Sore Risk: Sensory Perception: No  impairment  Moisture: Rarely moist  Activity: Chairfast  Mobility: Slightly limited  Nutrition: Adequate  Friction and Shear: Potential problem  Braden Score: 18  Level of Risk: At risk (15-18)   Pressure Relief every 30 minutes while in wheelchair   Turn patient every 2 hours   Assist patient to the bathroom every 2 hours    GASTROINTESTINAL  Abdomen: Soft. Nontender.  Bowel Sounds:  Active bowel sounds audible in all four quadrants.  Date of Last Bowel Movement:  11/19/15  Current Bowel Pattern:  Continent   No Problems/Complaints with Bowel Elimination Assessed.    GENITOURINARY  Current Bladder Pattern: Continent  Color:  Yellow Clear   Patient denies problems with urination and/or catheter.    Bowel and Bladder Output:                       Bladder (# only)             Bowel (# only)  Number of  Episodes  Continent            3                            1  Incontinent          0                            0    MUSCULOSKELETAL  Upper Body: WNL  Lower Body: BLE weakness, RLE limited mobility due to knee pain    Functional Measures      TOILETING: Toileting Score = 4.  Patient requires minimal assistance for  toileting, such as steadying for balance while cleansing or adjusting clothes.  Patient requires the following assistive device(s):  Grab bar.    BLADDER MANAGEMENT - LEVEL OF ASSIST: Bladder Score = 7. Patient is completely  independent for bladder management. There are no activity limitations.    BLADDER ACCIDENTS THIS SHIFT:  0 . Patient has not had an accident this  assessment and did not require medications or devices.    BOWEL MANAGEMENT - LEVEL OF ASSIST: Bowel Score = 6.  Patient is modified  independent for bowel management.  Patient did not have bowel movement.  Medication/intervention was provided.    BOWEL ACCIDENTS THIS SHIFT: 0 . Patient has not had an accident, but used a  stool softener.    COMPREHENSION: Both ( auditory and visual) modes of comprehension are used  equally. Comprehension Score = 7, Independent.  Patient comprehends  complex/abstract information in their primary language.  Patient is completely  independent for auditory and visual comprehension.  There are no activity  limitations.    EXPRESSION: Both ( vocal and non-vocal) modes of expression are used equally.  Expression Score = 7, Independent. Patient expresses complex/abstract  information in their primary language.  Patient is completely independent for  vocal and non-vocal expression.  There are no activity limitations.    SOCIAL INTERACTION: Social Interaction Score = 7, Independent. Patient is  completely independent for social interaction.  There are no activity  limitations.    PROBLEM SOLVING: Problem Solving Score = 7, Independent.  Patient makes  appropriate decisions in order to solve complex problems.   Patient is completely  independent for problem solving.  There are no activity limitations.    MEMORY: Memory Score =  7, Independent.  Patient is completely independent for  memory.  There are no activity limitations.    Education Provided:  Education Provided: Pain management. Pain scale. Medication  options. Side effects. Safety issues and interventions. Fall protocol.  Skin/wound care. Signs/symptoms of infection. Incision care. Prevention of skin  breakdown. Medication. Name and dosage. Administration. Purpose. Side Effects.       Audience: Patient.       Mode: Explanation.       Response: Verbalized understanding.    Discharge: Patient is not being discharged at this time.    Long Term Goals: 1. Patient will call for help 100% of time before getting OOB  inorder to avoid fall during his recovery time   2. Patient will verbalize pain 2/10 before and during therapy with the help of  current pain medication   3. Patient's surgica incision will heal with no complication   4. Patient will identify at least 2 sideffects of his medications inorder to  get prompt intervention  2 weeks from 11/18/15  Short Term Goals: 1. Patient will call for help 100% of time before getting OOB  inorder to avoid fall during his recovery time   2. Patient will verbalize pain 2/10 before and during therapy with the help of  current pain medication   3. Patient will identify at least 2 sideffects of 75% his medications inorder  to get prompt intervention  1 week from 11/18/15    PROGRESS TOWARD GOALS: Patient is new admit. He is working on goals.    PLAN: Nursing Specific Interventions  Medical Condition Management. Medication Management. Pain Management. Skin  Management. Wound Management.  Continue with the current Nursing Plan of Care.    TEAM CARE PLAN  Identified problems from team documentation:      Add/Update Problems from this assessment:  No updates at this time.    Please review Integrated Patient View Care Plan Flowsheet for  Team identified  Problems, Interventions, and Goals.    Signed by: Candida Peeling, RN 11/19/2015 11:00:00 PM

## 2015-11-20 NOTE — Rehab Progress Note ([facility]) (Signed)
NAMEQUANTAVIUS HUMM  MRN: [id number]  Account: [id number]  Session Start: 11/20/2015 12:00:00 AM  Session Stop: 11/20/2015 12:00:00 AM    SEVERE SEPSIS SCREEN  INFECTION:  Patient has no indication of infection.  Negative Sepsis Screen.  If you are unable to assess a system's dysfunction because you do not have labs,  or the labs you have are not current (within 24 hours), call physician and  request and order for the lab tests needed.    Signed by: Lowella Dell, RN 11/20/2015 7:48:00 AM

## 2015-11-21 MED ORDER — LIDOCAINE 5 % EX PTCH
1.0000 | MEDICATED_PATCH | CUTANEOUS | Status: DC
Start: 2015-11-21 — End: 2015-11-25
  Administered 2015-11-21 – 2015-11-22 (×2): 1 via TRANSDERMAL
  Filled 2015-11-21 (×2): qty 1

## 2015-11-21 MED ORDER — ZOLPIDEM TARTRATE 5 MG PO TABS
5.0000 mg | ORAL_TABLET | Freq: Every evening | ORAL | Status: DC | PRN
Start: 2015-11-21 — End: 2015-11-25
  Administered 2015-11-23: 5 mg via ORAL
  Filled 2015-11-21: qty 1

## 2015-11-21 NOTE — Rehab Progress Note ([facility]) (Signed)
NAMERAYNARD MAPPS  MRN: [id number]  Account: [id number]  Session Start: 11/21/2015 12:00:00 AM  Session Stop: 11/21/2015 12:00:00 AM    SEVERE SEPSIS SCREEN  INFECTION:  Patient has no indication of infection.  Negative Sepsis Screen.  If you are unable to assess a system's dysfunction because you do not have labs,  or the labs you have are not current (within 24 hours), call physician and  request and order for the lab tests needed.    Signed by: Coralyn Pear, RN 11/21/2015 9:15:00 AM

## 2015-11-21 NOTE — Progress Notes (Signed)
Matthew [name]  MRN: [id number]  Account: [id number]  Session Start: 11/21/2015 12:00:00 AM  Session Stop: 11/21/2015 12:00:00 AM    Physical Medicine and Rehabilitation  Initial Individualized Interdisciplinary Plan Of Care    Rehab Diagnosis: Degenerative lumbar spinal stenosis, Spondylolisthesis of  lumbar region, s/p fusion L4-S1  Demographics:            Age: 12Y            Gender: Male    Plan Of Care  Anticipated Discharge Date/Estimated Length of Stay: 2wks  Anticipated Discharge Destination: Community discharge with assistance  Discharge Plan : Home  Medical Necessity Expected Level Rationale: daily MD e/T/m for VS, labs, neuro  status....  Intensity and Duration: an average of 3 hours/5 days per week  Medical Supervision and 24 Hour Rehab Nursing: x  Physical Therapy: x  PT Intensity/Duration: 60-160min/day5-6days/wk  Occupational Therapy: x  OT Intensity/Duration: 60-189min/day5-6days/wk  Speech and Language Therapy: x  Therapeutic Recreation: x  Psychology: x  Registered Dietician: x    The following is a list of patient problems that have been identified by the  interdisciplinary team:    Problem: Impaired Mobility  Mobility Status Update: Pt requirs CGA for bed mobility, min A for transfers and  min A to walk 77ft with BPFW  Interventions:  Transfer training: Active  Gait training: Active  Mobility: Primary Team Goal/Status: Pt will perform majority of mobility at a  mod I level upon discharge with most appropriate assisstive device. /    Problem: Impaired Self-care Mgmt/ADL/IADL  Self Care/ADL/IADL Status Update: Pt is SBA with toileting and toilet transfer  with RW,  Supervision for UB/LB ADL with cues to maitain back precautions.  Limited by pain.  Self Care: Primary Team Goal/Status: Pt will be mod I for ADL and fxl transfer  with LRD /    Comments: 11-21-15   4:30 pm    Signed by: Candie Echevaria, MD 11/21/2015 4:32:00 PM    Physician CoSigned By: Candie Echevaria 11/21/2015 16:32:34

## 2015-11-21 NOTE — Rehab PPS CMG ([facility]) (Addendum)
Corrected 11/30/2015 3:45:12 PM    NAME: [name]  MRN: [id number]  Account: [id number]  Session Start: 11/20/2015 12:00:00 AM  Session Stop: 11/20/2015 12:00:00 AM    PPS CMG Coordinator  Inpatient Rehabilitation Admission    IRF Admission Date:  11/18/2015      Admission Class: Initial Rehab.  [facility]  Pre-Hospital Living: Home. Pre-Hospital Living  With: (1) Alone.  Payer Source: Primary: Medicare Fee for Service  Secondary: Not Listed.  Additional InformationCopywriter, advertising.  Ethnic Group:  Caucasian.  Marital Status:  Marital Status: Divorced.    Impairment Group: 08.9 Other Orthopedic  Date of Onset of Impairment: 11/10/2015    Etiologic Diagnosis Code(s):   Rank Code      Description  1    M48.06    Spinal stenosis, lumbar region    Comorbidities:   Rank Code      Description    1    I10.      Essential (primary) hypertension  2    K21.9     Gastro-esophageal reflux disease without                 esophagitis  3    N40.0     Benign prostatic hyperplasia without lower                 urinary tract symptoms  4    D64.9     Anemia, unspecified    Are there any arthritis conditions recorded for Impairment Group, Etiologic  Diagnosis, or Comorbid Conditions that meet all of the regulatory requirements  for IRF classification (in 42 CFR 412.29(b)(2)(x), (xi), and xii))? No    Cognitive Patterns: Brief Interview for Mental Status (BIMS) was conducted.  Repetition of Three Words: Three words  Temporal Orientation Year: Correct  Temporal Orientation Month: Accurate within 5 days  Temporal Orientation Day of Week: Correct  Recall Socks: Yes, no cue required  Recall Blue: Yes, no cue required  Recall Bed: Yes, no cue required  BIMS SUMMARY SCORE: 15 Cognitively intact Patient was able to complete the Brief  Interview for Mental Status    PAI Bladder Accidents:  0 - Accidents.    Patient used medications/device this  shift.  11/18/2015 4:20:00 PM  Bladder Score = 6. Patient has  not had an accident, but uses a  device/medication.  PAI Bowel Accident: 0 -Accidents.    Patient used medications/device this shift.   11/18/2015 4:20:00 PM  Bowel Score = 6. Patient has no accidents, but uses a device/medications.    Care Tool Bowel and Bladder: Bladder Continence: Always continent (no documented  incontinence).  Bowel Continence: Occasionally incontinent (one episode of bowel incontinence).    MEDICAL NEEDS  Height on Admission: 70 inches.  Weight on Admission: 158.5 pounds.  Swallowing/Nutritional Status: Regular food (solids and liquids swallowed safely  without supervision or modified food or liquid consistency).    QUALITY INDICATORS  Skin Conditions: Unhealed Pressure Ulcer(s) at Stage 1 or Higher on Admission:  No.    Active Diagnosis: Comorbidities and Co-existing Conditions:   Patient does not  have PAD, PVD, or Diabetes Mellitus    Signed by: Blenda Mounts, RN 11/20/2015 4:30:00 PM

## 2015-11-21 NOTE — Rehab IRF Data Coll Rights Priv Act ([facility]) (Signed)
NAMEROVERTO BODMER  MRN: [id number]  Account: [id number]  Session Start: 11/19/2015 12:00:00 AM  Session Stop: 11/19/2015 12:00:00 AM      IRFPPS Data Collection Rights and Rehab Privacy Act Notice: IRF-PAI Data  Collection Rights and Rehab Privacy Act Statement provided/given to patient and  family.    Signed by: Peggye Pitt, Rehab Tech 11/19/2015 8:00:00 AM

## 2015-11-21 NOTE — Rehab Progress Note ([facility]) (Signed)
NAMESANDIP POWER  MRN: [id number]  Account: [id number]  Session Start: 11/20/2015 12:00:00 AM  Session Stop: 11/20/2015 12:00:00 AM    SEVERE SEPSIS SCREEN  INFECTION:  Patient has no indication of infection.  Negative Sepsis Screen.  If you are unable to assess a system's dysfunction because you do not have labs,  or the labs you have are not current (within 24 hours), call physician and  request and order for the lab tests needed.    Signed by: Candida Peeling, RN 11/20/2015 9:15:00 PM

## 2015-11-21 NOTE — Progress Notes (Signed)
NAMEGARYN [name]  MRN: [id number]  Account: [id number]  Session Start: 11/21/2015 12:00:00 AM  Session Stop: 11/21/2015 12:00:00 AM    Physical Medicine and Rehabilitation  Initial Individualized Interdisciplinary Plan Of Care    Rehab Diagnosis: Degenerative lumbar spinal stenosis, Spondylolisthesis of  lumbar region, s/p fusion L4-S1  Demographics:            Age: 78Y            Gender: Male    Plan Of Care  Anticipated Discharge Date/Estimated Length of Stay: 2wks  Anticipated Discharge Destination: Community discharge with assistance  Discharge Plan : Home  Medical Necessity Expected Level Rationale: daily MD e/T/m for VS, labs, neuro  status....  Intensity and Duration: an average of 3 hours/5 days per week  Medical Supervision and 24 Hour Rehab Nursing: x  Physical Therapy: x  PT Intensity/Duration: 60-196min/day5-6days/wk  Occupational Therapy: x  OT Intensity/Duration: 60-148min/day5-6days/wk  Speech and Language Therapy: x  Therapeutic Recreation: x  Psychology: x  Registered Dietician: x    The following is a list of patient problems that have been identified by the  interdisciplinary team:    Problem: Impaired Mobility  Mobility Status Update: Pt requirs CGA for bed mobility, min A for transfers and  min A to walk 35ft with BPFW  Interventions:  Transfer training: Active  Gait training: Active  Mobility: Primary Team Goal/Status: Pt will perform majority of mobility at a  mod I level upon discharge with most appropriate assisstive device. /    Problem: Impaired Self-care Mgmt/ADL/IADL  Self Care/ADL/IADL Status Update: Pt is SBA with toileting and toilet transfer  with RW,  Supervision for UB/LB ADL with cues to maitain back precautions.  Limited by pain.  Self Care: Primary Team Goal/Status: Pt will be mod I for ADL and fxl transfer  with LRD /    Comments: 11-21-15   4:30 pm    Signed by: Matthew Echevaria, MD 11/21/2015 4:28:00 PM    Physician CoSigned By: Matthew [name] 11/21/2015 16:28:33

## 2015-11-21 NOTE — Rehab Progress Note ([facility]) (Signed)
NAMEBODHI MORADI  MRN: [id number]  Account: [id number]  Session Start: 11/21/2015 2:00:00 PM  Session Stop: 11/21/2015 3:00:00 PM    Physical Therapy  Inpatient Rehabilitation Progress Note    Rehab Diagnosis: Degenerative lumbar spinal stenosis, Spondylolisthesis of  lumbar region, s/p fusion L4-S1  Demographics:            Age: 45Y            Gender: Male  Rehabilitation Precautions/Restrictions:   Fall, skin and pressure ulcer, spinal precaution    SUBJECTIVE  Patient/Caregiver Goals:  To be able to walk alone with out pain  Pain: Patient currently has pain.  Patient reports a pain level of 6 out of 10. Patient medicated. Repositioned  patient.    OBJECTIVE    Functional Measures      TRANSFERS BED/CHAIR/WHEELCHAIR: Bed/chair/wheelchair Transfer Score = 4.  Patient performs 75% or more of effort and minimal assistance (little/incidental  help/lifting of one limb/steadying) for transferring to and from the  bed/chair/wheelchair, requiring: Steadying.  Patient requires the following assistive device(s): Walker.  Bed rails.  Arm rest.    LOCOMOTION WHEELCHAIR:   Wheelchair did not occur.    LOCOMOTION WALK:   Walk Distance Scale = 1.  Distance walked is less than 50 feet. Walk Score = 1.   Incidental assistance with lifting, contact guard or steadying was provided.  Patient performs 75% or more of effort and requires minimal contact assistance  for walking. Patient walked a distance of 40 feet.  B platform walker    LOCOMOTION STAIRS: Stairs did not occur because activity was unsafe for patient.      Interventions:       Therapeutic Activities:  Pt receivd sitting on toilet, elbows on knees,  noted R kneebuckle with attempts to stand from toilet however able to correct  indep. Pt requires total A for pants management. Pt returend to sidelying for  pain management, educated on spinal precautions, able to recall 2/3. Pt educated  on surgery, inflammation process and healing time/prognosis. Pt able to  verbalize  understanding of all education. Pt received abdominal binder for  comfort, B platform walker to improve stability with walking, slight hip flex  and noted R knee instability. Pt returned to sidelying with call bell in reach  and  all needs met  Pain Reassessment:  Response to Pain Intervention: Pt reports pain improves with sidelying  Post Intervention Pain Quality:  Throbbing.  Patient Reports Post Intervention Pain Level of: 4 out of 10  Pain Acceptable: Yes    Education Provided:    Education Provided: Precautions. Plan of care. Safety issues and interventions.  Supervision requirements. Use of adaptive devices. Activities of daily living.  Bed mobility. Functional transfers. Safety. Home exercise/activity plan.  Stair/curb/environmental barrier negotiation.       Audience: Patient.       Mode: Explanation.  Demonstration.       Response: Applied knowledge.  Verbalized understanding.  Demonstrated skill.  Needs practice.  Needs reinforcement.    ASSESSMENT  Pt demo unconrtolled pain and poor adherence to spinal precautions with bed  mobility. Pt will benefit from ongoing training to improve indep and safety with  all mobility    Long Term Goals: Pt will peform supine to/from sit on flat bed with mod I using  external support if needed for increased safety and independence upon discharge.  Pt will perform transfers mod I with most approrpriate assistive device for  increased safety and independence  upon discharge.  Pt will perform ambulation over level surfaces with most appropriate assistive  device x 150 feet mod I for increased safety and independence upon discharge.  Pt will ascend/descend a flight of stairs mod I with one hand rail to access  bedroom level of his daughter's home upon discharge.  Pt and his family will particiapte in needed education and training sessions to  maximize pt safety and independence upon discharge.  2 weeks from initial eval on 11/19/15.  Short Term Goals: Pt will perform supine to  sit via log rolling with min A.  Pt will ambulate 50 feet with RW, CGA.  Pt will ascend/descend 4 stairs with 2 handrails with CGA.  Pt will perform stand pivot transfers with RW and supervision.  1 week from initial eval on 11/19/15.    Progress Towards Goals: Pt making progress towards STGs    PLAN  Continued Physical Therapy is recommended.  Recommended Frequency/Duration/Intensity: 60-120 minutes per day, 5-6 days per  week for 2 weeks from initial eval on 11/19/15.  Activities Contributing Toward Care Plan: therapeutic exercise, therapeutic  activities, balance training, pain management (including modalities), bed  mobility training, precautions training, ambulation training, stair negotiation,  DME assessment, discharge planning in a multidisciplinary team  The patient is not appropriate for treatment in group format.    Care Plan  Identified problems from team documentation:  Problem: Impaired Mobility  Mobility: Primary Team Goal: Pt will perform majority of mobility at a mod I  level upon discharge with most appropriate assisstive device./    Problem: Impaired Self-care Mgmt/ADL/IADL  Self Care: Primary Team Goal: Pt will be mod I for ADL and fxl transfer with  LRD/    Add/Update Problems from this Treatment:  Update of existing problem:   Mobility: Pt requirs CGA for bed mobility, min A for transfers and min A to  walk 45ft with BPFW    Discipline:  Physical Therapy    Please review Integrated Patient View Care Plan Flowsheet for Team identified  Problems, Interventions, and Goals.    3 Hour Rule Minutes: 60 minutes of PT treatment this session count towards  intensity and duration of therapy requirement. Patient was seen for the full  scheduled time of PT treatment this session.  Therapy Mode Minutes: Individual: 60 minutes.    Signed by: Delsa Grana, PT, DPT, NCS 11/21/2015 3:00:00 PM

## 2015-11-21 NOTE — Rehab Progress Note ([facility]) (Addendum)
Corrected 11/21/2015 12:02:04 AM    NAME: Matthew [name]  MRN: [id number]  Account: [id number]  Session Start: 11/20/2015 12:00:00 AM  Session Stop: 11/20/2015 12:00:00 AM    Rehabilitation Nursing  Inpatient Rehabilitation Shift Assessment    Rehab Diagnosis: Degenerative lumbar spinal stenosis, Spondylolisthesis of  lumbar region  Demographics:            Age: 74Y            Gender: Male  Primary Language: English    Date of Onset:  11/10/15  Date of Admission: 11/18/2015 2:21:00 PM    Rehabilitation Precautions Restrictions:   Fall, skin and pressure ulcer, spinal precaution    Patient Report: " I feel okay".  Pain: Patient currently without complaints of pain.  Pain Reassessment: Pain was not reassessed as no pain was reported.  Patient/Caregiver Goals:  To be able to walk alone with out pain    NEURO  Orientation/Awareness: Alert and Oriented x3.  Speech: Clear.  Behavior: Cooperative.    MEDICATIONS  IV Access: No IV access.  Dialysis Access: Patient does not have dialysis access.      Elopement Risk Level Assessment Tool  Patient Criteria: Patient is not capable of leaving the unit.  Assessment is not  applicable.      RISK ASSESSMENT FOR FALLS/INJURY    MENTAL STATUS CRITERIA:   0 - None identified.  MENTAL STATUS TOTAL: 0    AGE CRITERIA:   93 - 75-79 years old  AGE TOTAL: 1    ELIMINATION CRITERIA:   3 - Toileting with Assistance.   ELIMINATION TOTAL: 3    HISTORY OF FALLS CRITERIA:   2 - Unknown History.  HISTORY OF FALLS TOTAL: 2    MEDICATIONS CRITERIA:   2 or more High Risk Medications (*see list below)   MEDICATIONS TOTAL: 2    PHYSICAL MOBILITY CRITERIA:   3 - Decreased balance reaction.   1 - Weakness/impaired physical mobility.   PHYSICAL MOBILITY TOTAL: 4    FALLS RISK ASSESSMENT TOTAL: 12    Patient's Fall Risk: TOTAL SCORE >10: High Risk    Falls Interventions: Clutter removed and clear path to BR.  Call bell, phone, glasses, etc within reach.  Hourly toileting/safety checks between 6am and 10pm,  then every 2 hours.  Initiate Fall care plan and outcome.  Yellow "high risk" patient identification in place: wrist band, socks, chart  sticker, door sign.  Pt and family education.  Assistive devices at [facility].  Pharmacy review of meds.  Bed alarm    NUTRITION  Diet:  Type: Regular.  Food Consistency: Regular.  Liquid Consistency: Thin.      CARDIOVASCULAR     Bilateral lower extremities  Nail Bed Color: Pink.  Pulses:   Apical Pulse: Regular. Strong. Rate is 96 .   Patient does not have a pacemaker.   Patient does not have a defibrillator.    CARDIOPULMONARY  Lung Sounds:   Upper lobes. Clear.   Lower lobes. Clear.  Type of Respirations: Regular.  Cough: No cough noted.  Respiratory Support: The patient does not require any respiratory support.  Respiratory Equipment: None.    INTEGUMENTARY  Skin:  Temperature: Warm  Turgor: Normal for age  Moisture: Dry  Color of skin: Normal for Race/Ethnicity  Capillary Refill: Less than 3 seconds  Wounds/Incisions:     Wound/incision not assessed this shift.   Dressing was changed by day shift RN  D/C/I  Braden Scale for Predicting Pressure  Sore Risk: Sensory Perception: No  impairment  Moisture: Rarely moist  Activity: Chairfast  Mobility: Slightly limited  Nutrition: Adequate  Friction and Shear: Potential problem  Braden Score: 18  Level of Risk: At risk (15-18)   Pressure Relief every 30 minutes while in wheelchair   Turn patient every 2 hours   Assist patient to the bathroom every 2 hours    GASTROINTESTINAL  Abdomen: Soft. Nontender.  Bowel Sounds:  Active bowel sounds audible in all four quadrants.  Date of Last Bowel Movement:  11/19/15  Current Bowel Pattern:  Continent   No Problems/Complaints with Bowel Elimination Assessed.    GENITOURINARY  Current Bladder Pattern: Continent  Color:  Yellow Clear   Patient denies problems with urination and/or catheter.    Bowel and Bladder Output:                       Bladder (# only)             Bowel (# only)  Number of  Episodes  Continent            3                            1  Incontinent          0                            0    MUSCULOSKELETAL  Upper Body: WNL  Lower Body: BLE weakness, RLE limited mobility due to knee pain    Functional Measures      TOILETING: Toileting Score = 4.  Patient requires minimal assistance for  toileting, such as steadying for balance while cleansing or adjusting clothes.  Patient requires the following assistive device(s):  Grab bar.    BLADDER MANAGEMENT - LEVEL OF ASSIST: Bladder Score = 7. Patient is completely  independent for bladder management. There are no activity limitations.    BLADDER ACCIDENTS THIS SHIFT:  0 . Patient has not had an accident this  assessment and did not require medications or devices.    BOWEL MANAGEMENT - LEVEL OF ASSIST: Bowel Score = 6.  Patient is modified  independent for bowel management.  Patient did not have bowel movement.  Medication/intervention was provided.    BOWEL ACCIDENTS THIS SHIFT: 0 . Patient has not had an accident, but used a  stool softener.    COMPREHENSION: Both ( auditory and visual) modes of comprehension are used  equally. Comprehension Score = 7, Independent.  Patient comprehends  complex/abstract information in their primary language.  Patient is completely  independent for auditory and visual comprehension.  There are no activity  limitations.    EXPRESSION: Both ( vocal and non-vocal) modes of expression are used equally.  Expression Score = 7, Independent. Patient expresses complex/abstract  information in their primary language.  Patient is completely independent for  vocal and non-vocal expression.  There are no activity limitations.    SOCIAL INTERACTION: Social Interaction Score = 7, Independent. Patient is  completely independent for social interaction.  There are no activity  limitations.    PROBLEM SOLVING: Problem Solving Score = 7, Independent.  Patient makes  appropriate decisions in order to solve complex problems.   Patient is completely  independent for problem solving.  There are no activity limitations.    MEMORY: Memory Score =  7, Independent.  Patient is completely independent for  memory.  There are no activity limitations.    Education Provided:  Education Provided: Pain management. Pain scale. Medication  options. Side effects. Safety issues and interventions. Fall protocol.  Skin/wound care. Signs/symptoms of infection. Incision care. Prevention of skin  breakdown. Medication. Name and dosage. Administration. Purpose. Side Effects.       Audience: Patient.       Mode: Explanation.       Response: Verbalized understanding.    Discharge: Patient is not being discharged at this time.    Long Term Goals: 1. Patient will call for help 100% of time before getting OOB  inorder to avoid fall during his recovery time   2. Patient will verbalize pain 2/10 before and during therapy with the help of  current pain medication   3. Patient's surgica incision will heal with no complication   4. Patient will identify at least 2 sideffects of his medications inorder to  get prompt intervention  2 weeks from 11/18/15  Short Term Goals: 1. Patient will call for help 100% of time before getting OOB  inorder to avoid fall during his recovery time   2. Patient will verbalize pain 2/10 before and during therapy with the help of  current pain medication   3. Patient will identify at least 2 sideffects of 75% his medications inorder  to get prompt intervention  1 week from 11/18/15    PROGRESS TOWARD GOALS: Patient is new admit. He is working on goals.    PLAN: Nursing Specific Interventions  Medical Condition Management. Medication Management. Pain Management. Skin  Management. Wound Management.  Continue with the current Nursing Plan of Care.    TEAM CARE PLAN  Identified problems from team documentation:      Add/Update Problems from this assessment:  No updates at this time.    Please review Integrated Patient View Care Plan Flowsheet for  Team identified  Problems, Interventions, and Goals.    Signed by: Candida Peeling, RN 11/20/2015 11:00:00 PM

## 2015-11-21 NOTE — Rehab Evaluation ([facility]) (Signed)
NAMETRACKER Matthew [name]  MRN: [id number]  Account: [id number]  Session Start: 11/21/2015 12:00:00 AM  Session Stop: 11/21/2015 12:00:00 AM    Nutrition  Inpatient Rehabilitation Initial Assessment    Rehab Diagnosis: Degenerative lumbar spinal stenosis, Spondylolisthesis of  lumbar region, s/p fusion L4-S1  Demographics:            Age: 74Y            Gender: Male  Primary Language: English    Past Medical History: Hypertension controlled  Syncopal episodes  approx 2012  1x episode - had negative cardiac work-up - no episodes sinice  BPH (benign prostatic hypertrophy)  2007  w/ hx of obstruction - s/p suprapubic prostatectomy  History of pancreatitis  2007  had microscopic stones - ERCP w/ stent done  Spinal stenosis  Spondylolisthesis  Fibromyalgia  in remission  Gastroesophageal reflux disease  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 male admitted to [facility] 11/10/15 with hx chronic  pancreatitis, BPH, HTN who had chest pain before lumbar laminectomy and fusion  on 12/1 by Dr. Marjo Bicker. He had an EKG during the episode that showed no ST  elevations or LBBB. He proceeded with surgery. He states the pain was sharp,  left lower chest / left upper quadrant, and lasted /R/2 minutes. He denies chest  pain, shortness of breath, but notes acid reflux. These symptoms are sudden  onset, moderate intensity, without alleviating factors.  Surgery 12/1:  Lumbosacral fusion  Chest Pain- Etiology unclear however resolved without intervention after 2  minutes and has not recurred   EKG without acute ischemic changes, troponin negative X 1, will trend to rule  out ACS  Does note history of acid reflux which may have contributed to chest pain v  atypical  in etiology   CXR does not show infiltrate or other acute pathology, radiology read pending  No recurrence of chest pain after resolution.   Denies shortness of breath,  abdominal pain, nausea, vomiting, or reflux at this time.  States he is doing  well now.  Plan of care discussed with family at bedside.  12/3- Nausea/Vomiting- Likely due to recent surgery, acid reflux,  Restarted on  home PPI  12/4- Abdominal x-rays show ileus, no evidence of obstruction, will obtain  follow up per radiology recommendations, LFTs and Lipase without significant  acute abnormality, Already ruled out for ACS as above  11/14/15- Follow up abdominal x-rays shows mildly improved since the prior study  favoring a resolving ileus. Clear liquid diet.  Doppler to rule out DVT RLE for  R knee pain 10/10.  RESULTS:  No evidence of deep venous thrombosis involving  the right  lower extremity.  Right Knee effusion with limited range of motion, fevers, and  leukocytosis, Orthopedics performed joint aspiration 12/6: negative for  crystals, WBC 20K, Fluid Cx NGTD,  Knee x-rays show significant effusion  possibly due to inflammatory arthropathy, Surgical site appears C/D/I  Blood cx obtained, UA shows hematuria (possibly due to traumatic cath) and not  consistent with UTI, CXR shows developing right basal infiltrate however patient  without clinical signs/symptoms  of PNA (no cough, sputum production, shortness  of breath)  12/7-Patient complaining of worsening neck pain.  On eval by PT patient with  significant difficulty with passive ROM of Cervical neck.  Mild TTP along spiny  process.  MRI c spine pending  11/17/15-Neck Pain: Hx of Cervical fusion. MRI c spine 12/8 no acute findings.  Will continue symptomatic treatment.  Afebrile overnight.  12/9-Pt participating in therapy and will benefit from acute rehab @ [facility].   Pt  afebrile, medically stable to transfer today.            Date of Onset: 11/10/15            Date of Admission: 11/18/2015 2:21:00  PM    Medications and Allergies: Significant rehabilitation considerations:   See EPIC for details.  Rehabilitation Precautions/Restrictions:   Fall, skin and pressure ulcer, spinal precaution    SUBJECTIVE  Patient Reports: I'm eating okay, but I'd like 1 Ensure per day.  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  Patient/Caregiver Goals:  Patient's functional goals: To be able to walk alone  with out pain  Pain: Patient currently without complaints of pain.    OBJECTIVE  Labs:   Results for SID, GREENER (MRN [id number]) as of 11/21/2015 12:30    11/19/2015 06:43  Alkaline Phosphatase: 141 (H)  Albumin: 2.5 (L)  Protein, Total: 4.9 (L)  Glucose: 103 (L)  Weight: 158.5 pounds. 72.05 kilograms.  Height:  70 inches. 1.78 meters.  BMI: 22.74 kilogram per meters squared.  Weight Group:  Normal weight  Admission Weight: 158.5    Usual Weight: 168 pounds.  Ideal Body Weight: 166 pounds. 75.45 kilograms.  Percent Ideal Body Weight: Patient's current weight is 95 % of ideal body  weight.  Weight Change: Patient has had no weight change since admission.   NA    Diet Intake Prior to Admission: regular  Supplements Prior to Admission: Ensure  Herbals/Vitamins Prior to Admission:  MVI/MIN    Food Allergies/Intolerances: No known food allergies.  Religious/Cultural Food Practices: NA  Current Medical/Nutrition Therapy: Diet: Regular.   diet with no liquid consistency restrictions.    % of Meals Consumed: 50-75 %  Feeding Modality and Dentition: oral  Current Problems/GI Symptoms: Appetite.  Wounds/Incisions:  Back sx incision    Estimated Nutrition and Fluids Needs:  1900 calories (Mifflin x 1.3)  72-86 grams of Protein (1.0-1.2 grams/kg)  240  grams of carbohydrates (50 % total kcal)  1900 ml fluids (1 ml/kcal)    ASSESSMENT  Overall Assessment of Nutritional Status:  Current Nutrition Intake: Adequate.  Current Nutrition Status: Adequate.  Nutritional Risk Assessment:  No nutritional  risk present.  Nutrition Diagnosis:   NC-3.2 Involuntary Weight Loss.   related to poor appetite as evidenced by pt report of 10# wt loss.    Interventions Provided/Recommendations:   Assessment Completed.   Nutritional Adequacy Assessed:   Tolerance to Current Nutrition Therapy Assessed:   Aided in Supplement Selection: choc Ensure at lunch daily.    Barriers to Progress/Discharge: No potential barriers to progress.      Pain Reassessment: Pain was not reassessed as no pain was reported.    Interdisciplinary Educational Needs and Learning Preferences:  Education not  assessed/provided this session.    Long Term Goals:  Time frame to achieve long term goal(s): 2 months       1. Pt to maintain wt.  Short Term Goals:  Time frame to  achieve short term goal(s): 1 week       1. Pt to increase to 75-100%.    PLAN  Recommendations for Follow-up: Will continue to monitor po intake and labs  within 7 days.    Care Plan  Identified problems from team documentation:  Problem: Impaired Mobility  Mobility: Primary Team Goal: Pt will perform majority of mobility at a mod I  level upon discharge with most appropriate assisstive device./    Problem: Impaired Self-care Mgmt/ADL/IADL  Self Care: Primary Team Goal: Pt will be mod I for ADL and fxl transfer with  LRD/    Identified problems from this assessment:     No problems identified at this time.    Please review Integrated Patient View Care Plan Flowsheet for Team identified  Problems, Interventions, and Goals.    Signed by: Leeanne Deed, RD 11/21/2015 12:40:00 PM

## 2015-11-21 NOTE — Rehab Progress Note ([facility]) (Signed)
NAMEANDRA MATSUO  MRN: [id number]  Account: [id number]  Session Start: 11/21/2015 12:00:00 AM  Session Stop: 11/21/2015 12:00:00 AM    SEVERE SEPSIS SCREEN  INFECTION:  Patient has no indication of infection.  Negative Sepsis Screen.  If you are unable to assess a system's dysfunction because you do not have labs,  or the labs you have are not current (within 24 hours), call physician and  request and order for the lab tests needed.    Signed by: Carianna Lague, RN 11/21/2015 8:00:00 PM

## 2015-11-21 NOTE — Rehab Progress Note ([facility]) (Signed)
[name]  MRN: [id number]  Account: [id number]  Session Start: 11/21/2015 12:00:00 AM  Session Stop: 11/21/2015 12:00:00 AM    Rehabilitation Nursing  Inpatient Rehabilitation Shift Assessment    Rehab Diagnosis: Degenerative lumbar spinal stenosis, Spondylolisthesis of  lumbar region, s/p fusion L4-S1  Demographics:            Age: 74Y            Gender: Male  Primary Language: English    Date of Onset:  11/10/15  Date of Admission: 11/18/2015 2:21:00 PM    Rehabilitation Precautions Restrictions:   Fall, skin and pressure ulcer, spinal precaution    Patient Report: " I am okay".  Pain: Patient currently without complaints of pain.  Pain Reassessment: Pain was not reassessed as no pain was reported.  Patient/Caregiver Goals:  To be able to walk alone with out pain    NEURO  Orientation/Awareness: Alert and Oriented x3.  Speech: Clear.  Behavior: Cooperative.    MEDICATIONS  IV Access: No IV access.  Dialysis Access: Patient does not have dialysis access.      Elopement Risk Level Assessment Tool  Patient Criteria: Patient is not capable of leaving the unit.  Assessment is not  applicable.      RISK ASSESSMENT FOR FALLS/INJURY    MENTAL STATUS CRITERIA:   0 - None identified.  MENTAL STATUS TOTAL: 0    AGE CRITERIA:   33 - 93-84 years old  AGE TOTAL: 1    ELIMINATION CRITERIA:   3 - Toileting with Assistance.   ELIMINATION TOTAL: 3    HISTORY OF FALLS CRITERIA:   2 - Unknown History.  HISTORY OF FALLS TOTAL: 2    MEDICATIONS CRITERIA:   2 or more High Risk Medications (*see list below)   MEDICATIONS TOTAL: 2    PHYSICAL MOBILITY CRITERIA:   3 - Decreased balance reaction.   1 - Weakness/impaired physical mobility.   PHYSICAL MOBILITY TOTAL: 4    FALLS RISK ASSESSMENT TOTAL: 12    Patient's Fall Risk: TOTAL SCORE >10: High Risk    Falls Interventions: Clutter removed and clear path to BR.  Call bell, phone, glasses, etc within reach.  Hourly toileting/safety checks between 6am and 10pm, then every 2  hours.  Yellow "high risk" patient identification in place: wrist band, socks, chart  sticker, door sign.  Assistive devices at [name].  Pharmacy review of meds.  Bed alarm    NUTRITION  Diet:  Type: Regular.  Food Consistency: Regular.  Liquid Consistency: Thin.      CARDIOVASCULAR     Bilateral lower extremities  Nail Bed Color: Pink.   No edema or redness present.  Pulses:   Apical Pulse: Regular. Strong. Rate is 96 .   Patient does not have a pacemaker.   Patient does not have a defibrillator.    CARDIOPULMONARY  Lung Sounds:   Upper lobes. Clear.   Lower lobes. Clear.  Type of Respirations: Regular.  Cough: No cough noted.  Respiratory Support: The patient does not require any respiratory support.  Respiratory Equipment: None. 95%, RA.    INTEGUMENTARY  Skin:  Temperature: Warm  Turgor: Normal for age  Moisture: Dry  Color of skin: Normal for Race/Ethnicity  Capillary Refill: Less than 3 seconds  Wounds/Incisions:     Surgical Incision: Back incision. Length: 12 centimeter(s) with glue. No signs  of infection.  Drainage: Incision without drainage.  Odor:  No  Incision Care: Per protocol.  Braden Scale for Predicting Pressure Sore Risk: Sensory Perception: No  impairment  Moisture: Rarely moist  Activity: Chairfast  Mobility: Slightly limited  Nutrition: Adequate  Friction and Shear: Potential problem  Braden Score: 18  Level of Risk: At risk (15-18)   Pressure Relief every 30 minutes while in wheelchair   Turn patient every 2 hours   Assist patient to the bathroom every 2 hours    GASTROINTESTINAL  Abdomen: Soft. Nontender.  Bowel Sounds:  Active bowel sounds audible in all four quadrants.  Date of Last Bowel Movement:  11/19/15  Current Bowel Pattern:  Continent   No Problems/Complaints with Bowel Elimination Assessed.    GENITOURINARY  Current Bladder Pattern: Continent  Color:  Yellow   Patient denies problems with urination and/or catheter.    Bowel and Bladder Output:                       Bladder (# only)              Bowel (# only)  Number of Episodes  Continent            2                            0  Incontinent          0                            0    MUSCULOSKELETAL  Upper Body: WNL  Lower Body: BLE weakness, RLE limited mobility due to knee pain    Functional Measures      BLADDER MANAGEMENT - LEVEL OF ASSIST: Bladder Score = 5.  Patient is  supervision/set-up for bladder management, requiring: Setting out equipment.  Emptying equipment.  Patient requires the following assistive device(s): Urinal.    BLADDER ACCIDENTS THIS SHIFT:  0 . Patient has not had an accident this  assessment and did not require medications or devices.    BOWEL MANAGEMENT - LEVEL OF ASSIST: Bowel Score = 6. Patient is modified  independent for bowel management requiring: Stool softeners    BOWEL ACCIDENTS THIS SHIFT: 0 . Patient has not had an accident and did not  require medications or devices.    COMPREHENSION: Auditory comprehension is the usual mode. Comprehension Score =  7, Independent.  Patient comprehends complex/abstract information in their  primary language.  Patient is completely independent for auditory comprehension.   There are no activity limitations.    EXPRESSION: Vocal expression is the usual mode. Expression Score = 7,  Independent.  Patient expresses complex/abstract information in their primary  language.  Patient is completely independent for vocal expression.  There are no  activity limitations.    SOCIAL INTERACTION: Social Interaction Score = 7, Independent. Patient is  completely independent for social interaction.  There are no activity  limitations.    PROBLEM SOLVING: Problem Solving Score = 7, Independent.  Patient makes  appropriate decisions in order to solve complex problems.  Patient is completely  independent for problem solving.  There are no activity limitations.    MEMORY: Memory Score = 7, Independent.  Patient is completely independent for  memory.  There are no activity limitations.    Education  Provided:    Education Provided: Precautions. Pain management. Pain scale. Medication  options. Side effects. Clinical indicators of pain. Safety issues  and  interventions. Fall protocol. Supervision requirements. Skin/wound care.  Incision care. Prevention of skin breakdown. Signs/symptoms of infection.  Medication. Name and dosage. Administration. Purpose. Side Effects. Interaction.         Audience: Patient.       Mode: Explanation.       Response: Verbalized understanding.    Discharge: Patient is not being discharged at this time.    Long Term Goals: 1. Patient will call for help 100% of time before getting OOB  inorder to avoid fall during his recovery time   2. Patient will verbalize pain 2/10 before and during therapy with the help of  current pain medication   3. Patient's surgica incision will heal with no complication   4. Patient will identify at least 2 sideffects of his medications inorder to  get prompt intervention  2 weeks from 11/18/15  Short Term Goals: 1. Patient will call for help 100% of time before getting OOB  inorder to avoid fall during his recovery time   2. Patient will verbalize pain 2/10 before and during therapy with the help of  current pain medication   3. Patient will identify at least 2 sideffects of 75% his medications inorder  to get prompt intervention  1 week from 11/18/15    PROGRESS TOWARD GOALS: Pt calls for help 75% at this time, cont. with pain  menagement and well managed, no complaint voiced.    PLAN: Nursing Specific Interventions  Medical Condition Management. Medication Management. Pain Management. Skin  Management. Wound Management.  Continue with the current Nursing Plan of Care.    TEAM CARE PLAN  Identified problems from team documentation:  Problem: Impaired Mobility  Mobility: Primary Team Goal: Pt will perform majority of mobility at a mod I  level upon discharge with most appropriate assisstive device./    Problem: Impaired Self-care Mgmt/ADL/IADL  Self Care:  Primary Team Goal: Pt will be mod I for ADL and fxl transfer with  LRD/    Add/Update Problems from this assessment:  No updates at this time.    Please review Integrated Patient View Care Plan Flowsheet for Team identified  Problems, Interventions, and Goals.    Signed by: Coralyn Pear, RN 11/21/2015 12:26:00 PM

## 2015-11-21 NOTE — Progress Notes (Signed)
[facility] Physiatry Attending Face to Face Progress Note   Functional Status/Update:   I reviewed patient's therapy notes to assess functional status and ongoing need for therapies. Of note, Mod A for transfers, ambulated 10 feet with walker. Independent for cognition/speech concerns.     Subjective:   Complains of poor sleep. Uses home Belsomra. No constipation. No chest pain. No shortness of breath.     Objective:   Filed Vitals:    11/20/15 1700 11/20/15 2024 11/21/15 0454 11/21/15 0819   BP: 149/71 122/59 109/64 105/63   Pulse: 110 96 87 96   Temp: 99.8 F (37.7 C) 99.7 F (37.6 C) 97.4 F (36.3 C)    TempSrc: Oral Oral Oral    Resp: 20 20 18     Height:       Weight:       SpO2: 96% 95% 95%        Physical Examination:   Appears well.  In no acute distress. Normal body habitus. Appears stated age.   Conjunctivae non-injected. EOMI.   Symmetric facies. Moist mucous membranes.   +S1S2 Heart rate and rhythm are regular. No significant lower limb edema.   Lungs are clear to auscultation bilaterally. No wheezes, rales, or rhonchi. Good respiratory effort.   Soft. Non-tender. Normoactive bowel sounds.   Alert Pleasant and cooperative Oriented x 3   Lumbar incision c/d/i with steri strips at caudal aspect  Knee warm, mild TTP at lateral inferior aspect of patellar tendon. No joint line tenderness or effusion noted.     New Labs:  Results     ** No results found for the last 24 hours. **          Current medications:   Scheduled Meds:  Current Facility-Administered Medications   Medication Dose Route Frequency   . amLODIPine  2.5 mg Oral Daily   . atorvastatin  10 mg Oral QHS   . docusate sodium  100 mg Oral BID   . hydrocortisone   Topical BID   . lidocaine  1 patch Transdermal Q24H   . lisinopril  5 mg Oral Daily   . morphine  15 mg Oral Q8H   . multivitamin  1 tablet Oral Daily   . pantoprazole  40 mg Oral Daily   . pramipexole  0.25 mg Oral Q12H     PRN Meds:.ALPRAZolam, cyclobenzaprine, dextromethorphan-guaiFENesin,  HYDROmorphone, HYDROmorphone, zolpidem    Assessment: 74 y.o. male with debility, difficulty with ADLs s/p L4-S1 Laminectomy/fusion  Plan:   REHAB: Continue comprehensive and intensive inpatient rehab program, including:   Physical therapy 60-120 min daily, 5-6 times per week, Occupational therapy  60-120 min daily, 5-6 times per week, Therapeutic recreation, Case management and Rehabilitation nursing    Will continue to address the following impairments and issues:  Mobility, ADLs, Adherence to precautions and Caregiver training    S/p L4-S1 Laminectomy and fusion  -controlled on current regimen  -dry dressings to wound.    Aseptic inflammation of knee:   -Lidoderm patch q12h    Insomina:   Patient's daughter will bring in home sleep aid: suvorexant (Belsomra) 20mg  QHS.     HTN: Home amlodipine and lisinopril. Assess daily.  I have discussed the plan of care with the resident physician and examined patient independently.  I have reviewed the note above and concur with findings and plan.  Additional comments are:  Doing well in tx.  Team conf tomorrow.

## 2015-11-21 NOTE — Rehab Progress Note ([facility]) (Addendum)
Corrected 11/22/2015 12:58:59 PM    NAME: Matthew [name]  MRN: [id number]  Account: [id number]  Session Start: 11/21/2015 10:00:00 AM  Session Stop: 11/21/2015 12:00:00 PM    Occupational Therapy  Inpatient Rehabilitation Progress Note    Rehab Diagnosis: Degenerative lumbar spinal stenosis, Spondylolisthesis of  lumbar region, s/p fusion L4-S1  Demographics:            Age: 85Y            Gender: Male  Rehabilitation Precautions/Restrictions:   Fall, skin and pressure ulcer, spinal precaution    SUBJECTIVE  Patient Report: "I am ready to get out of this bed."  Pain: Patient currently has pain.  Patient reports a pain level of 5 out of 10. Repositioned patient.    OBJECTIVE    Activities of Daily Living  Feeding: Not assessed.  Grooming: Supervision. Patient requires setup, handling supplies, and/or cueing.  The patient used the following equipment: None  Bathing: Supervision. Patient requires set up, handling supplies, and/or cueing.  The patient used the following equipment: hand held shower, grab bars  Upper Body Dressing: Supervision. Patient requires setup, handling supplies,  and/or cueing. Helper applies orthotic/prosthetic. The patient used the  following equipment: None  Lower Body Dressing: Supervision. Patient requires setup, handling supplies,  and/or cueing. Helper applies orthotic/prosthetic. The patient used the  following equipment: None  Toileting: Supervision. Patient requires setup, handling supplies and/or cueing.  The patient used the following equipment: adaptive device for stability  Toilet Transfer: Supervision. Patient requires setup of equipment/wheelchair  and/or cueing The patient used the following equipment: None  Tub/ShowerTransfers: Patient transferred to shower. Minimal assistance. Patient  needs steadying assistance or less than 25% physical lifting. The patient used  the following equipment: shower chair, grab bars    Functional Measures (other than ADL tasks)      TRANSFERS  BED/CHAIR/WHEELCHAIR: Bed/chair/wheelchair Transfer Score = 4.  Patient performs 75% or more of effort and minimal assistance (little/incidental  help/lifting of one limb/steadying) for transferring to and from the  bed/chair/wheelchair, requiring: Contact guard.  Patient requires the following assistive device(s): Bed rails.  Elevated head of bed.    Interventions:       Self Care/Home Management:  Pt seen for shower ADL, CGA for bed mobility in  /out of hospital bed and standard bed with increased cues for log roll tech/back  precautions. Pt was CG for shower transfer with RW and SBA/S for toileting and  toilet transfer with RW.  See fxl measure for full ADL status. Pt standing at  sink for grooming task with unilateral UE support for balance. Pt reports  dizziness with standing "resolves within seconds". BP taken pt was 117/73  seated, 88/68 standing asymptomatic, seated after 2 min 104/78. Notified NSG and  made aware. BP taken in supine 110/63 HR 100. Reviewed back precautions during  ADL and tech to improve LB ADL while maintaining precautions - good  understanding and carryover.       Therapeutic Exercise:  Pt c/o some neck discomfort reviewed shoulder  shrugs, scap retrac and neck rotation to help reduce tightness - complete 10  reps of each x 2 sets. Green theraband exercises 10 reps x 3 sets within comfort  of back.  Pain Reassessment:  Response to Pain Intervention: fair  Post Intervention Pain Quality:  Shooting.  Patient Reports Post Intervention Pain Level of: 5 out of 10  Pain Acceptable: Yes    Education Provided:    Education Provided: Precautions.  Activities of daily living. Bed mobility.  Functional transfers. Safety.       Audience: Patient.       Mode: Explanation.  Demonstration.       Response: Verbalized understanding.  Needs practice.  Needs reinforcement.    ASSESSMENT  Pt agreeable to OT tx. See above for tx details. Pt Cont to ben limited by back  pain throughout tx session, decreased  balance and fxl transfers. Pt orthostatic  during tx today. Nsg notified and made aware. Discussed in length proper body  mechanins with fxl self care tasks and importance of log roll for in/out of bed  throughout tx. Cont to require reinforcement. Pt will cont to benefit from OT to  address deficits noted above to max LOF and safety. Cont POC as tolerated.    Long Term Goals: Pt will be mod I with complete ADL/self care management with  LHAE prn to maintain back precautions and reduce pain.  Pt will be mod I for toilet transfer with LRD  Pt will be Supervision for shower/tub transfer with proper DME  Pt will be S for light home management tasks with LRD  10-14 days from initial OT eval  Short Term Goals: Pt will be S for LB ADL with AE prn to maintain back  precautions and reduce pain  Pt will be S for toilet transfer with LRD  1 week from initial OT eval on 12/10    Progress Towards Goals: Cont POC as tolerated.    PLAN  Continued Occupational Therapy is recommended.  Recommended Frequency/Duration/Intensity: 5-6days/wk, 60-120 min/day, 10 -14  days  Continued Activities Contributing Toward Care Plan: balance, strengthening, ther  ax,  self care management, fxl trasnfers, AE/DME training, Family training in  prep for d/c home  Patient is appropriate for treatment in gait or mobility group.    Care Plan  Identified problems from team documentation:  Problem: Impaired Mobility  Mobility: Primary Team Goal: Pt will perform majority of mobility at a mod I  level upon discharge with most appropriate assisstive device./    Problem: Impaired Self-care Mgmt/ADL/IADL  Self Care: Primary Team Goal: Pt will be mod I for ADL and fxl transfer with  LRD/    Add/Update Problems from this Treatment:  Update of existing problem:   Self Care Management: Pt is SBA with toileting and toilet transfer with RW,  Supervision for UB/LB ADL with cues to maitain back precautions. Limited by  pain.    Discipline:  Occupational  Therapy    Please review Integrated Patient View Care Plan Flowsheet for Team identified  Problems, Interventions, and Goals.    3 Hour Rule Minutes: 120 minutes of OT treatment this session count towards  intensity and duration of therapy requirement. Patient was seen for the full  scheduled time of OT treatment this session.  Therapy Mode Minutes: Individual: 120 minutes.    Signed by: Retia Passe, OTR/L 11/21/2015 12:00:00 PM

## 2015-11-21 NOTE — Rehab Progress Note ([facility]) (Addendum)
Corrected 11/21/2015 11:52:23 PM    NAME: Matthew [name]  MRN: [id number]  Account: [id number]  Session Start: 11/21/2015 12:00:00 AM  Session Stop: 11/21/2015 12:00:00 AM    Rehabilitation Nursing  Inpatient Rehabilitation Shift Assessment    Rehab Diagnosis: Degenerative lumbar spinal stenosis, Spondylolisthesis of  lumbar region, s/p fusion L4-S1  Demographics:            Age: 74Y            Gender: Male  Primary Language: English    Date of Onset:  11/10/15  Date of Admission: 11/18/2015 2:21:00 PM    Rehabilitation Precautions Restrictions:   Fall, skin and pressure ulcer, spinal precaution    Patient Report: " I'm doing fine".  Pain: Patient currently has pain.  Location: back and right knee.  Type: Acute  Quality:  sore.  Pain Scale: Numeric.  Patient reports a pain level of 6 out of 10.  Patient's acceptable level of pain 3 out of 10.   Interferes with sleep.  Pain is alleviated by: pain medication.  Pain is exacerbated by: movements. Patient medicated.  Pain Reassessment:  Response to Pain Intervention: decreased level of pain.  Post Intervention Pain Quality:  Aching.  Patient Reports Post Intervention Pain Level of: 3 out of 10  Pain Acceptable: Yes  Patient/Caregiver Goals:  To be able to walk alone with out pain    NEURO  Orientation/Awareness: Alert and Oriented x3.  Speech: Clear.  Behavior: Cooperative.    MEDICATIONS  IV Access: No IV access.  Dialysis Access: Patient does not have dialysis access.      Elopement Risk Level Assessment Tool  Patient Criteria: Patient is not capable of leaving the unit.  Assessment is not  applicable.      RISK ASSESSMENT FOR FALLS/INJURY    MENTAL STATUS CRITERIA:   0 - None identified.  MENTAL STATUS TOTAL: 0    AGE CRITERIA:   61 - 50-63 years old  AGE TOTAL: 1    ELIMINATION CRITERIA:   3 - Toileting with Assistance.   ELIMINATION TOTAL: 3    HISTORY OF FALLS CRITERIA:   2 - Unknown History.  HISTORY OF FALLS TOTAL: 2    MEDICATIONS CRITERIA:   2 or more High Risk  Medications (*see list below)   MEDICATIONS TOTAL: 2    PHYSICAL MOBILITY CRITERIA:   3 - Decreased balance reaction.   1 - Weakness/impaired physical mobility.   PHYSICAL MOBILITY TOTAL: 4    FALLS RISK ASSESSMENT TOTAL: 12    Patient's Fall Risk: TOTAL SCORE >10: High Risk    Falls Interventions: Clutter removed and clear path to BR.  Call bell, phone, glasses, etc within reach.  Hourly toileting/safety checks between 6am and 10pm, then every 2 hours.  Initiate Fall care plan and outcome.  Yellow "high risk" patient identification in place: wrist band, socks, chart  sticker, door sign.  Pt and family education.    NUTRITION  Diet:  Type: Regular.  Food Consistency: Regular.  Liquid Consistency: Thin.      CARDIOVASCULAR     Bilateral lower extremities  Nail Bed Color: Pink.   No edema or redness present.  Pulses:   Left Brachial Pulse: Regular. Strong. Rate is 93 .   Patient does not have a pacemaker.   Patient does not have a defibrillator.    CARDIOPULMONARY  Lung Sounds:   Upper lobes. Clear.   Lower lobes. Clear.  Type of Respirations: Regular.  Cough:  No cough noted.  Respiratory Support: The patient does not require any respiratory support.  Respiratory Equipment: None. 95%    INTEGUMENTARY  Skin:  Temperature: Warm  Turgor: Normal for age  Moisture: Dry  Color of skin: Normal for Race/Ethnicity  Capillary Refill: Less than 3 seconds  Wounds/Incisions:     Surgical Incision: Back incision. Length: 12 centimeter(s) with glue. No signs  of infection.  Drainage: Incision without drainage.  Odor:  No  Incision Care: Per protocol.  Braden Scale for Predicting Pressure Sore Risk: Sensory Perception: No  impairment  Moisture: Rarely moist  Activity: Chairfast  Mobility: Slightly limited  Nutrition: Adequate  Friction and Shear: Potential problem  Braden Score: 18  Level of Risk: At risk (15-18)   Turn patient every 2 hours   Assist patient to the bathroom every 2 hours    GASTROINTESTINAL  Abdomen: Soft.  Nontender.  Bowel Sounds:  Active bowel sounds audible in all four quadrants.  Date of Last Bowel Movement:  11/20/15  Current Bowel Pattern:  Continent   No Problems/Complaints with Bowel Elimination Assessed.    GENITOURINARY  Current Bladder Pattern: Continent  Color:  Yellow   Patient denies problems with urination and/or catheter.    Bowel and Bladder Output:                       Bladder (# only)             Bowel (# only)  Number of Episodes  Continent            2                            0  Incontinent          0                            0    MUSCULOSKELETAL  Upper Body: WNL  Lower Body: BLE weakness, RLE limited mobility due to knee pain    Functional Measures      BLADDER MANAGEMENT - LEVEL OF ASSIST: Bladder Score = 5.  Patient is  supervision/set-up for bladder management, requiring: Setting out equipment.  Emptying equipment.  Patient requires the following assistive device(s): Urinal.    BLADDER ACCIDENTS THIS SHIFT:  0 . Patient has not had an accident but used a  urinal this assessment.    BOWEL MANAGEMENT - LEVEL OF ASSIST: Bowel Score = 6.  Patient is modified  independent for bowel management.  Patient did not have bowel movement.  Medication/intervention was provided.    BOWEL ACCIDENTS THIS SHIFT: 0 . Patient has not had an accident, but used a  stool softener.    Education Provided:    Education Provided: Pain management. Medication options. Side effects. Clinical  indicators of pain. Safety issues and interventions. Fall protocol. Supervision  requirements. Use of adaptive devices. Medication. Administration. Side Effects.  Purpose.       Audience: Patient.       Mode: Explanation.       Response: Needs reinforcement.    Discharge: Patient is not being discharged at this time.    Long Term Goals: 1. Patient will call for help 100% of time before getting OOB  inorder to avoid fall during his recovery time   2. Patient will verbalize pain 2/10 before and during therapy with  the help  of  current pain medication   3. Patient's surgica incision will heal with no complication   4. Patient will identify at least 2 sideffects of his medications inorder to  get prompt intervention  2 weeks from 11/18/15  Short Term Goals: 1. Patient will call for help 100% of time before getting OOB  inorder to avoid fall during his recovery time   2. Patient will verbalize pain 2/10 before and during therapy with the help of  current pain medication   3. Patient will identify at least 2 sideffects of 75% his medications inorder  to get prompt intervention  1 week from 11/18/15    PROGRESS TOWARD GOALS: Patient complaint of discomfort level 6/10. Ask for PRN  medication no more than 2x per 12 hours shift.    PLAN: Nursing Specific Interventions  Medical Condition Management. Medication Management. Pain Management. Skin  Management. Wound Management.  Continue with the current Nursing Plan of Care.    TEAM CARE PLAN  Identified problems from team documentation:  Problem: Impaired Mobility  Mobility: Primary Team Goal: Pt will perform majority of mobility at a mod I  level upon discharge with most appropriate assisstive device./    Problem: Impaired Self-care Mgmt/ADL/IADL  Self Care: Primary Team Goal: Pt will be mod I for ADL and fxl transfer with  LRD/    Add/Update Problems from this assessment:  Update of existing problem:   Pain Management: C/O of discomfort level 6/10. Ask for PRN  medication no more  than 2x per 12 hours shift.    Discipline:  Nursing    Please review Integrated Patient View Care Plan Flowsheet for Team identified  Problems, Interventions, and Goals.    Signed by: Joslynn Jamroz, RN 11/21/2015 11:00:00 PM

## 2015-11-22 MED ORDER — PATIENT SUPPLIED NON FORMULARY
20.0000 mg | Freq: Every evening | Status: DC | PRN
Start: 2015-11-22 — End: 2015-11-25

## 2015-11-22 NOTE — Rehab Progress Note ([facility]) (Signed)
NAMEQUANDARIUS NILL  MRN: [id number]  Account: [id number]  Session Start: 11/22/2015 12:00:00 AM  Session Stop: 11/22/2015 12:00:00 AM    SEVERE SEPSIS SCREEN  INFECTION:  Patient has no indication of infection.  Negative Sepsis Screen.  If you are unable to assess a system's dysfunction because you do not have labs,  or the labs you have are not current (within 24 hours), call physician and  request and order for the lab tests needed.    Signed by: Coralyn Pear, RN 11/22/2015 8:57:00 AM

## 2015-11-22 NOTE — Rehab Progress Note ([facility]) (Signed)
NAMEJERRELLE [name]  MRN: [id number]  Account: [id number]  Session Start: 11/22/2015 2:00:00 PM  Session Stop: 11/22/2015 2:15:00 PM    Occupational Therapy  Inpatient Rehabilitation Treatment Note    Rehab Diagnosis: Degenerative lumbar spinal stenosis, Spondylolisthesis of  lumbar region, s/p fusion L4-S1  Demographics:            Age: 58Y            Gender: Male  Rehabilitation Precautions/Restrictions:   Fall, skin and pressure ulcer, spinal precaution    Interventions:       Therapeutic Activities:  15 min of ind session prior to group while waiting  group member. session addressed pt s goal " I have to do the stairs in my  daughter's house" pt completed sit to stand transitions, w RW min cues for hand  placement,  S, completed 4" and 6 " stairs, w B rails, S, one self correction  for correct sequencing for descending transition.    3 Hour Rule Minutes: 15 minutes of OT treatment this session count towards  intensity and duration of therapy requirement. Patient was seen for the full  scheduled time of OT treatment this session.  Therapy Mode Minutes: Individual: 15 minutes.    Signed by: Milinda Antis, OTR/L 11/22/2015 2:15:00 PM

## 2015-11-22 NOTE — Rehab Progress Note ([facility]) (Signed)
NAME: Matthew [name]  MRN: [id number]  Account: [id number]  Session Start: 11/22/2015 2:15:00 PM  Session Stop: 11/22/2015 3:00:00 PM    Occupational Therapy  Inpatient Rehabilitation Group Note    Rehab Diagnosis: Degenerative lumbar spinal stenosis, Spondylolisthesis of  lumbar region, s/p fusion L4-S1  Demographics:            Age: 53Y            Gender: Male  Rehabilitation Precautions/Restrictions:   Fall, skin and pressure ulcer, spinal precaution    SUBJECTIVE  Patient Report: " You snuck another hour of therapy in there for me, I hope I  wont be sore"  Patient/Caregiver Goals: Reviewed pt s goals, in therapy wiht pt, per pt , " I  need to do the stairs at my daughter's house, and move around on my own."  Pain: Patient currently has pain.  Patient reports a pain level of 4 out of 10. pt reported having had pain meds  prior to session. educated on spinal precautions  Pain Reassessment:  Response to Pain Intervention: not limiting session  Post Intervention Pain Quality:  Aching.  Patient Reports Post Intervention Pain Level of: 4 out of 10  Pain Acceptable: Yes    OBJECTIVE  Number of Patients Participating in Group: 2  Number of Licensed Providers Involved in the Group: 1    OT Group Category: Patient participated in a structured functional activities  group session that included the following: The Occupational Therapy functional  areas addressed by the structured group included: Functional transfers.  Mobility.  OT Group Comments: Reviewed spinal precautions w pt able to name 1/3  without  cues, needed min cues throughout trnasfers and bed mob. completed sit to stand  SPT w RW to and from wc and mat, sit to supine to sit onto wedge ( due to acid  reflux) w S, maintaing spinal precautions, supine to sit w S min cues for  technique. completed exceriss for glutes, quads, 15 x 2 each w rest breaks, B UE  shoulder, chest press w 4 lb weight bar. pt returned to room by rehab tech.,    OT Targeted Impairments: The  Occupational Therapy impairments addressed by the  structured group included: Diminished strength that impacts functional  independence. spinal precautions w functional trnasfers  OT Functional Impairments: The Occupational Therapy functional areas addressed  by the structured group included: Functional transfers. Mobility.    Education Provided:    Education Provided: Bed mobility. Functional transfers. spinal precautions .       Audience: Patient.       Mode: Explanation.  Demonstration.       Response: Verbalized understanding.  Needs reinforcement.    3 Hour Rule Minutes: 45 minutes of OT treatment this session count towards  intensity and duration of therapy requirement. Patient was seen for the full  scheduled time of OT treatment this session.  Therapy Mode Minutes: Individual: 45 minutes.    Signed by: Milinda Antis, OTR/L 11/22/2015 3:00:00 PM

## 2015-11-22 NOTE — Rehab Evaluation ([facility]) (Signed)
[name]  MRN: [id number]  Account: [id number]  Session Start: 11/22/2015 12:00:00 AM  Session Stop: 11/22/2015 12:00:00 AM    Case Management  Inpatient Rehabilitation Initial Assessment    Rehab Diagnosis: Degenerative lumbar spinal stenosis, Spondylolisthesis of  lumbar region, s/p fusion L4-S1  Demographics:            Age: 74Y            Gender: Male    Past Medical History: Hypertension controlled  Syncopal episodes  approx 2012  1x episode - had negative cardiac work-up - no episodes sinice  BPH (benign prostatic hypertrophy)  2007  w/ hx of obstruction - s/p suprapubic prostatectomy  History of pancreatitis  2007  had microscopic stones - ERCP w/ stent done  Spinal stenosis  Spondylolisthesis  Fibromyalgia  in remission  Gastroesophageal reflux disease  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 male admitted to [facility] 11/10/15 with hx chronic  pancreatitis, BPH, HTN who had chest pain before lumbar laminectomy and fusion  on 12/1 by Dr. Marjo Bicker. He had an EKG during the episode that showed no ST  elevations or LBBB. He proceeded with surgery. He states the pain was sharp,  left lower chest / left upper quadrant, and lasted /R/2 minutes. He denies chest  pain, shortness of breath, but notes acid reflux. These symptoms are sudden  onset, moderate intensity, without alleviating factors.  Surgery 12/1:  Lumbosacral fusion  Chest Pain- Etiology unclear however resolved without intervention after 2  minutes and has not recurred   EKG without acute ischemic changes, troponin negative X 1, will trend to rule  out ACS  Does note history of acid reflux which may have contributed to chest pain v  atypical in etiology   CXR does  not show infiltrate or other acute pathology, radiology read pending  No recurrence of chest pain after resolution.   Denies shortness of breath,  abdominal pain, nausea, vomiting, or reflux at this time.  States he is doing  well now.  Plan of care discussed with family at bedside.  12/3- Nausea/Vomiting- Likely due to recent surgery, acid reflux,  Restarted on  home PPI  12/4- Abdominal x-rays show ileus, no evidence of obstruction, will obtain  follow up per radiology recommendations, LFTs and Lipase without significant  acute abnormality, Already ruled out for ACS as above  11/14/15- Follow up abdominal x-rays shows mildly improved since the prior study  favoring a resolving ileus. Clear liquid diet.  Doppler to rule out DVT RLE for  R knee pain 10/10.  RESULTS:  No evidence of deep venous thrombosis involving  the right  lower extremity.  Right Knee effusion with limited range of motion, fevers, and  leukocytosis, Orthopedics performed joint aspiration 12/6: negative for  crystals, WBC 20K, Fluid Cx NGTD,  Knee x-rays show significant effusion  possibly due to inflammatory arthropathy, Surgical site appears C/D/I  Blood cx obtained, UA shows hematuria (possibly due to traumatic cath) and not  consistent with UTI, CXR shows developing right basal infiltrate however patient  without clinical signs/symptoms of PNA (no  cough, sputum production, shortness  of breath)  12/7-Patient complaining of worsening neck pain.  On eval by PT patient with  significant difficulty with passive ROM of Cervical neck.  Mild TTP along spiny  process.  MRI c spine pending  11/17/15-Neck Pain: Hx of Cervical fusion. MRI c spine 12/8 no acute findings.  Will continue symptomatic treatment.  Afebrile overnight.  12/9-Pt participating in therapy and will benefit from acute rehab @ [facility].   Pt  afebrile, medically stable to transfer today.   Date of Onset: 11/10/15   Date of Admission: 11/18/2015 2:21:00 PM    Premorbid Functional Level:  Patient reported Pt lives alone.  Pt was using a RW  for 2 months prior to surgery due to back pain.  Understanding of Current Condition: Pt appears to understand his functional  status and can state spinal precautions.   Pt with lots of questions about his  pain, swelling in his R knee and the fever he had while in the hospital.  Patient/Caregiver Goals:  Patient's functional goals: To be able to walk alone  with out pain  Home Environment: Patient lives in a home environment. Patient lives alone. Pt  plans to stay with his daughter after AR>  Primary Language: English    Demographics: (Source of HX, Pre-Hosp, Marital, Income, Vocation, [place])  Source of History: Patient.  Pre-Hospital Living  Environment: Home.  Marital Status: Divorced.  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    Vocational Status: Retired for Age.  Family Contact Information:  Primary Contact: Lorne Skeens  Relationship: Daughter  Address:  Primary Number: [phone]  Secondary Number:  POA/Guardian Information:  Patient does not have a guardian.  Care Act Designee: Ian Malkin  Address:  Phone number:  Military Status: It is unknown whether the patient has ever been in the  [employer].  Financial Concerns: none stated.    Special Needs: None.  Observed Behaviors:  Cooperative.  Psychosocial History:  Adjustment to Present Illness:  Patient is coping adequately.   Patient is accepting limitations adequately.   Patient's expectations are realistic.   Patient is motivated.  Patient Perceived Primary Stressors:  Providers: PCP/Pulm: Dr. Ladene Artist    Home Care/Long Term Care Policy: None.    Interdisciplinary Educational Needs and Learning Preferences:       Learning Preference: The patient's preferred learning method is:  Explanation.       Barriers to Learning: No barriers.       Learning Needs: None.    Education Provided: Plan of care.       Audience: Patient.       Mode: Explanation.       Response: Applied knowledge.  Verbalized  understanding.    Rehab Potential: Able to participate in an intensive inpatient interdisciplinary  rehabilitation program, Good family/social support, Motivated  Barriers to Progress/Discharge: No potential barriers to progress.    Case Management Psychosocial Assessment: CM met pt in room to discuss role and  acute rehab program. Team rounds to be held on 11/22/15 at 2 pm to discuss ELOS,  goals of care and barriers to discharge.  Pt lives in a on level home but plans  to stay with his daughter after discharge.    Medicare Important Message: The Medicare Important Message letter was issued.    Care Plan  Identified problems from team documentation:  Problem: Impaired Mobility  Mobility: Primary Team Goal: Pt will perform majority of mobility at a mod I  level upon  discharge with most appropriate assisstive device./    Problem: Impaired Pain Management    Problem: Impaired Self-care Mgmt/ADL/IADL  Self Care: Primary Team Goal: Pt will be mod I for ADL and fxl transfer with  LRD/    Identified problems from this assessment:     No problems identified at this time.    Please review Integrated Patient View Care Plan Flowsheet for Team identified  Problems, Interventions, and Goals.    Signed by: Adela Lank, MSW 11/22/2015 11:59:00 AM

## 2015-11-22 NOTE — Rehab Progress Note ([facility]) (Signed)
[name]  MRN: [id number]  Account: [id number]  Session Start: 11/22/2015 12:00:00 AM  Session Stop: 11/22/2015 12:00:00 AM    Rehabilitation Nursing  Inpatient Rehabilitation Shift Assessment    Rehab Diagnosis: Degenerative lumbar spinal stenosis, Spondylolisthesis of  lumbar region, s/p fusion L4-S1  Demographics:            Age: 74Y            Gender: Male  Primary Language: English    Date of Onset:  11/10/15  Date of Admission: 11/18/2015 2:21:00 PM    Rehabilitation Precautions Restrictions:   Fall, skin and pressure ulcer, spinal precaution    Patient Report: " I am okay".  Pain: Patient currently has pain.  Location: Back  Type: Acute  Quality: Aching.  Pain Scale: Numeric.  Patient reports a pain level of 5 out of 10.  Patient's acceptable level of pain 0 out of 10.   Interferes with physical activity.  Pain is alleviated by: Pain meds  Pain is exacerbated by: Movements Patient medicated. Repositioned patient.  Pain Reassessment:  Response to Pain Intervention: Helpful  Post Intervention Pain Quality:  None.  Patient Reports Post Intervention Pain Level of: 0 out of 10  Pain Acceptable: Yes  Patient/Caregiver Goals:  To be able to walk alone with out pain    NEURO  Orientation/Awareness: Alert and Oriented x3.  Speech: Clear.  Behavior: Cooperative.    MEDICATIONS  IV Access: No IV access.  Dialysis Access: Patient does not have dialysis access.      Elopement Risk Level Assessment Tool  Patient Criteria: Patient is not capable of leaving the unit.  Assessment is not  applicable.      RISK ASSESSMENT FOR FALLS/INJURY    MENTAL STATUS CRITERIA:   0 - None identified.  MENTAL STATUS TOTAL: 0    AGE CRITERIA:   74 - 42-48 years old  AGE TOTAL: 1    ELIMINATION CRITERIA:   3 - Toileting with Assistance.   ELIMINATION TOTAL: 3    HISTORY OF FALLS CRITERIA:   2 - Unknown History.  HISTORY OF FALLS TOTAL: 2    MEDICATIONS CRITERIA:   2 or more High Risk Medications (*see list below)   MEDICATIONS TOTAL:  2    PHYSICAL MOBILITY CRITERIA:   3 - Decreased balance reaction.   1 - Weakness/impaired physical mobility.   PHYSICAL MOBILITY TOTAL: 4    FALLS RISK ASSESSMENT TOTAL: 12    Patient's Fall Risk: TOTAL SCORE >10: High Risk    Falls Interventions: Clutter removed and clear path to BR.  Call bell, phone, glasses, etc within reach.  Hourly toileting/safety checks between 6am and 10pm, then every 2 hours.  Yellow "high risk" patient identification in place: wrist band, socks, chart  sticker, door sign.  Assistive devices at [facility].  Reoriented PRN.  Pharmacy review of meds.  Bed alarm    NUTRITION  Diet:  Type: Regular.  Food Consistency: Regular.  Liquid Consistency: Thin.      CARDIOVASCULAR     Bilateral lower extremities  Nail Bed Color: Pink.   No edema or redness present.  Pulses:   Apical Pulse: Regular. Strong. Rate is 76 .   Patient does not have a pacemaker.   Patient does not have a defibrillator.    CARDIOPULMONARY  Lung Sounds:   Upper lobes. Clear.   Lower lobes. Clear.  Type of Respirations: Regular.  Cough: No cough noted.  Respiratory Support: The patient  does not require any respiratory support.  Respiratory Equipment: None. 94%, RA.    INTEGUMENTARY  Skin:  Temperature: Warm  Turgor: Normal for age  Moisture: Dry  Color of skin: Normal for Race/Ethnicity  Capillary Refill: Less than 3 seconds  Wounds/Incisions:     Surgical Incision: Back incision. Length: 12 centimeter(s) with glue. No signs  of infection.  Drainage: Incision without drainage.  Odor:  No  Incision Care: Per protocol.  Braden Scale for Predicting Pressure Sore Risk: Sensory Perception: No  impairment  Moisture: Rarely moist  Activity: Chairfast  Mobility: Slightly limited  Nutrition: Adequate  Friction and Shear: Potential problem  Braden Score: 18  Level of Risk: At risk (15-18)   Pressure Relief every 30 minutes while in wheelchair   Turn patient every 2 hours   Assist patient to the bathroom every 2  hours    GASTROINTESTINAL  Abdomen: Soft. Nontender.  Bowel Sounds:  Active bowel sounds audible in all four quadrants.  Date of Last Bowel Movement:  11/22/15  Current Bowel Pattern:  Continent   No Problems/Complaints with Bowel Elimination Assessed.    GENITOURINARY  Current Bladder Pattern: Continent  Color:  Yellow   Patient denies problems with urination and/or catheter.    Bowel and Bladder Output:                       Bladder (# only)             Bowel (# only)  Number of Episodes  Continent            3                            0  Incontinent          0                            0    MUSCULOSKELETAL  Upper Body: WNL  Lower Body: BLE weakness, RLE limited mobility due to knee pain    Functional Measures      BLADDER MANAGEMENT - LEVEL OF ASSIST: Bladder Score = 5.  Patient is  supervision/set-up for bladder management, requiring: Setting out equipment.  Emptying equipment.  Patient requires the following assistive device(s): Urinal.    BLADDER ACCIDENTS THIS SHIFT:  0 . Patient has not had an accident this  assessment and did not require medications or devices.    BOWEL MANAGEMENT - LEVEL OF ASSIST: Bowel Score = 6. Patient is modified  independent for bowel management requiring: Stool softeners    BOWEL ACCIDENTS THIS SHIFT: 0 . Patient has not had an accident and did not  require medications or devices.    Education Provided:    Education Provided: Precautions. Pain management. Pain scale. Medication  options. Side effects. Clinical indicators of pain. Safety issues and  interventions. Fall protocol. Supervision requirements. Skin/wound care.  Signs/symptoms of infection. Prevention of skin breakdown. Incision care. Spinal  cord. Maintaining bowel and bladder regularity.  Hypotension/Hypertension.  Prevention of contractures. Medication. Name and dosage. Administration.  Purpose. Side Effects. Interaction.       Audience: Patient.       Mode: Explanation.       Response: Verbalized  understanding.    Discharge: Patient is not being discharged at this time.    Long Term Goals: 1. Patient will call for  help 100% of time before getting OOB  inorder to avoid fall during his recovery time   2. Patient will verbalize pain 2/10 before and during therapy with the help of  current pain medication   3. Patient's surgica incision will heal with no complication   4. Patient will identify at least 2 sideffects of his medications inorder to  get prompt intervention  2 weeks from 11/18/15  Short Term Goals: 1. Patient will call for help 100% of time before getting OOB  inorder to avoid fall during his recovery time   2. Patient will verbalize pain 2/10 before and during therapy with the help of  current pain medication   3. Patient will identify at least 2 sideffects of 75% his medications inorder  to get prompt intervention  1 week from 11/18/15    PROGRESS TOWARD GOALS: SHORT TERM GOAL REVIEW:       1. Patient will call for help 100% of time before getting OOB inorder to  avoid fall during his recovery time - Met       2. Patient will verbalize pain 2/10 before and during therapy with the help  of current pain medication - Not Met: Pain > 3/10       3. Patient will identify at least 2 sideffects of 75% his medications  inorder to get prompt intervention - Not Met: Needs more reinforecemnt.  Time frame to achieve short term goal(s):  1 week from 11/18/15    PLAN: Nursing Specific Interventions  Medical Condition Management. Medication Management. Pain Management. Skin  Management. Wound Management.  Continue with the current Nursing Plan of Care.    TEAM CARE PLAN  Identified problems from team documentation:  Problem: Impaired Mobility  Mobility: Primary Team Goal: Pt will perform majority of mobility at a mod I  level upon discharge with most appropriate assisstive device./    Problem: Impaired Pain Management    Problem: Impaired Self-care Mgmt/ADL/IADL  Self Care: Primary Team Goal: Pt will be mod I for ADL  and fxl transfer with  LRD/    Add/Update Problems from this assessment:  No updates at this time.    Please review Integrated Patient View Care Plan Flowsheet for Team identified  Problems, Interventions, and Goals.    Signed by: Coralyn Pear, RN 11/22/2015 11:23:00 AM

## 2015-11-22 NOTE — Rehab Evaluation ([facility]) (Signed)
[name]  MRN: [id number]  Account: [id number]  Session Start: 11/21/2015 12:00:00 AM  Session Stop: 11/21/2015 12:00:00 AM    Therapeutic Recreation  Inpatient Rehabilitation Initial Evaluation    Risks/Benefits of Rehabilitation Discussed with Patient/Caregiver: Yes.    Rehab Diagnosis: Degenerative lumbar spinal stenosis, Spondylolisthesis of  lumbar region, s/p fusion L4-S1  Demographics:            Age: 74Y            Gender: Male  Primary Language: English    Past Medical History: Hypertension controlled  Syncopal episodes  approx 2012  1x episode - had negative cardiac work-up - no episodes sinice  BPH (benign prostatic hypertrophy)  2007  w/ hx of obstruction - s/p suprapubic prostatectomy  History of pancreatitis  2007  had microscopic stones - ERCP w/ stent done  Spinal stenosis  Spondylolisthesis  Fibromyalgia  in remission  Gastroesophageal reflux disease  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 male admitted to [name]15 with hx chronic  pancreatitis, BPH, HTN who had chest pain before lumbar laminectomy and fusion  on 12/1 by [name]. He had an EKG during the episode that showed no ST  elevations or LBBB. He proceeded with surgery. He states the pain was sharp,  left lower chest / left upper quadrant, and lasted /R/2 minutes. He denies chest  pain, shortness of breath, but notes acid reflux. These symptoms are sudden  onset, moderate intensity, without alleviating factors.  Surgery 12/1:  Lumbosacral fusion  Chest Pain- Etiology unclear however resolved without intervention after 2  minutes and has not recurred   EKG without acute ischemic changes, troponin negative X 1, will trend to rule  out  ACS  Does note history of acid reflux which may have contributed to chest pain v  atypical in etiology   CXR does not show infiltrate or other acute pathology, radiology read pending  No recurrence of chest pain after resolution.   Denies shortness of breath,  abdominal pain, nausea, vomiting, or reflux at this time.  States he is doing  well now.  Plan of care discussed with family at bedside.  12/3- Nausea/Vomiting- Likely due to recent surgery, acid reflux,  Restarted on  home PPI  12/4- Abdominal x-rays show ileus, no evidence of obstruction, will obtain  follow up per radiology recommendations, LFTs and Lipase without significant  acute abnormality, Already ruled out for ACS as above  11/14/15- Follow up abdominal x-rays shows mildly improved since the prior study  favoring a resolving ileus. Clear liquid diet.  Doppler to rule out DVT RLE for  R knee pain 10/10.  RESULTS:  No evidence of deep venous thrombosis involving  the right  lower extremity.  Right Knee effusion with limited range of motion, fevers, and  leukocytosis, Orthopedics performed joint aspiration 12/6: negative for  crystals, WBC 20K, Fluid Cx NGTD,  Knee x-rays show significant effusion  possibly due to inflammatory arthropathy, Surgical site appears C/D/I  Blood cx obtained, UA shows hematuria (possibly due to traumatic cath) and not  consistent with UTI, CXR  shows developing right basal infiltrate however patient  without clinical signs/symptoms of PNA (no cough, sputum production, shortness  of breath)  12/7-Patient complaining of worsening neck pain.  On eval by PT patient with  significant difficulty with passive ROM of Cervical neck.  Mild TTP along spiny  process.  MRI c spine pending  11/17/15-Neck Pain: Hx of Cervical fusion. MRI c spine 12/8 no acute findings.  Will continue symptomatic treatment.  Afebrile overnight.  12/9-Pt participating in therapy and will benefit from acute rehab @ [name].   Pt  afebrile, medically stable to  transfer today.            Date of Onset: 11/10/15            Date of Admission: 11/18/2015 2:21:00 PM    Medications and Allergies: Significant rehabilitation considerations:   See EPIC for details.  Rehabilitation Precautions/Restrictions:   Fall, skin and pressure ulcer, spinal precaution    SUBJECTIVE  Premorbid Functional Level: Patient reported Pt lives alone.  Pt was using a RW  for 2 months prior to surgery due to back pain.  Understanding of Current Condition: Pt appears to understand his functional  status and can state spinal precautions.   Pt with lots of questions about his  pain, swelling in his R knee and the fever he had while in the [name].  Patient/Caregiver Goals:  Patient's functional goals: To be able to walk alone  with out pain  Pain: Patient currently without complaints of pain.  Social History:  Marital Status: divorced  Children: daughter [name]          Reside:  here in [place]  Employment Status:  retired  Recreational Activities/Hobbies:    OBJECTIVE  Physical Limitations: Pt with impaired endurance, activity tolerance and  decreased balance.    Cognition/Behavior: WFL for leisure activities    Leisure Interests:  Intellectual: Reading, Computers  Home Activities:  Television, Card games  Social:  Visiting, Movies, Restaurants  Other:  Runs a non-profit    Pain Reassessment: Pain was not reassessed as no pain was reported.    Interdisciplinary Educational Needs and Learning Preferences:       Learning Preference: The patient's preferred learning method is:  Explanation.  The patient's preferred learning method is: Demonstration.       Barriers to Learning: Mobility.       Learning Needs: Leisure/vocational    Education Provided: No education provided this session.    ASSESSMENT  Summary of Deficits and Related Problems: Patient presents with the following  deficits: Decreased Community Functioning. Decreased Functional Mobility.  Decreased Leisure Independence. Decreased Community  Functioning. Decreased  Functional Mobility. Decreased Leisure Independence.  These deficits are secondary to: Impaired Strength. Impaired Coordination.  Impaired Balance.  Rehab Potential: Good family/social support, Good premorbid functional status,  Living in the community premorbidly  Barriers to Progress/Discharge: Functional status    Long Term Goals: Time frame to achieve long term goal(s): 2 weeks       1. Pt will participate in a leisure activity of choice with mod I upon  discharge.       2. Pt will improve strength and endurance as demonstrated through the  ability to perform and tolerate various activities upon discharge.  Short Term Goals: Not applicable.    Recommendations/Goals for Rehabilitation Discussed with Patient/Caregiver: Yes.        PLAN  Therapeutic Recreation services are recommended to address: leisure maintenance,  endurance, education, socialization  Care Plan  Identified problems from team documentation:  Problem: Impaired Mobility  Mobility: Primary Team Goal: Pt will perform majority of mobility at a mod I  level upon discharge with most appropriate assisstive device./    Problem: Impaired Pain Management    Problem: Impaired Self-care Mgmt/ADL/IADL  Self Care: Primary Team Goal: Pt will be mod I for ADL and fxl transfer with  LRD/    Identified problems from this assessment:     No problems identified at this time.    Please review Integrated Patient View Care Plan Flowsheet for Team identified  Problems, Interventions, and Goals.    Signed by: Pearson Forster, CTRS 11/21/2015 4:00:00 PM

## 2015-11-22 NOTE — Rehab Progress Note ([facility]) (Signed)
NAMEASEEL UHDE  MRN: [id number]  Account: [id number]  Session Start: 11/22/2015 11:00:00 AM  Session Stop: 11/22/2015 12:00:00 PM    Physical Therapy  Inpatient Rehabilitation Progress Note - Brief    Rehab Diagnosis: Degenerative lumbar spinal stenosis, Spondylolisthesis of  lumbar region, s/p fusion L4-S1  Demographics:            Age: 61Y            Gender: Male  Rehabilitation Precautions/Restrictions:   Fall, skin and pressure ulcer, spinal precaution    SUBJECTIVE  Pain: Patient currently has pain.  Patient reports a pain level of 2 out of 10. Repositioned patient.    OBJECTIVE    Interventions:       Therapeutic Activities:  Pt tx focused on TA contractions while supine in  bed with visual and tactile cues. Pt received hand out and education regarding  anatomy for improved understanding. Pt performed approx 20 reps with cues to  count out loud in order to prevent holding breath. Pt ambulated >2103ft x2 reps  with sup, noted R knee minor buckling. Pt received ace wrap to knee, negotiated  8 steps with step to pattern after demo, returned to RN station with lunch, all  needs met  Pain Reassessment: Pain was not reassessed as no pain was reported.    ASSESSMENT  Pt demo improved [place] to sitting and standing this session, will benefit  from ongoing training to improve indep and safety with all MRADLs    PLAN  Continued Physical Therapy is recommended.  Recommended Frequency/Duration/Intensity: 60-120 minutes per day, 5-6 days per  week for 2 weeks from initial eval on 11/19/15.  Activities Contributing Toward Care Plan: therapeutic exercise, therapeutic  activities, balance training, pain management (including modalities), bed  mobility training, precautions training, ambulation training, stair negotiation,  DME assessment, discharge planning in a multidisciplinary team    3 Hour Rule Minutes: 60 minutes of PT treatment this session count towards  intensity and duration of therapy requirement. Patient was  seen for the full  scheduled time of PT treatment this session.  Therapy Mode Minutes: Individual: 60 minutes.    Signed by: Delsa Grana, PT, DPT, NCS 11/22/2015 12:00:00 PM

## 2015-11-22 NOTE — Progress Notes (Signed)
[facility] Physiatry Attending Face to Face Progress Note   Functional Status/Update:   I reviewed patient's therapy notes to assess functional status and ongoing need for therapies. Of note, Supervision for mobility, ADLs and I-ADLs. Improved adherence to precautions    Subjective:   Feels well and is without complaints. No headache. Pain is controlled with current meds. No constipation. No chest pain. No shortness of breath. Sleeping well. Would like to go home "as soon as possible"    Objective:   Filed Vitals:    11/21/15 0819 11/21/15 1634 11/22/15 0618 11/22/15 0828   BP: 105/63 109/61 108/62 113/63   Pulse: 96 93 89 76   Temp:  99.4 F (37.4 C) 98 F (36.7 C)    TempSrc:       Resp:  16 18    Height:       Weight:       SpO2:  95% 94%        Physical Examination:   Appears well.  In no acute distress. Normal body habitus. Appears stated age.   Anicteric sclerae. Conjunctivae non-injected. EOMI.   Symmetric facies. Hearing grossly intact. Dentition in good repair. Moist mucous membranes.   +S1S2 Heart rate and rhythm are regular. No murmurs/rubs/gallops. No significant lower limb edema.   Lungs are clear to auscultation bilaterally. No wheezes, rales, or rhonchi. Good respiratory effort.   Soft. Non-tender. Normoactive bowel sounds.   Alert Pleasant and cooperative Oriented x 3  ACE bandage on knee, mild TTP   Lumbar incision, NTTP, C/D/I dry dressings. Closure with surgical glue.    New Labs:  Results     ** No results found for the last 24 hours. **          Current medications:   Scheduled Meds:  Current Facility-Administered Medications   Medication Dose Route Frequency   . amLODIPine  2.5 mg Oral Daily   . atorvastatin  10 mg Oral QHS   . docusate sodium  100 mg Oral BID   . hydrocortisone   Topical BID   . lidocaine  1 patch Transdermal Q24H   . lisinopril  5 mg Oral Daily   . morphine  15 mg Oral Q8H   . multivitamin  1 tablet Oral Daily   . pantoprazole  40 mg Oral Daily   . pramipexole  0.25 mg Oral Q12H      PRN Meds:.ALPRAZolam, cyclobenzaprine, dextromethorphan-guaiFENesin, HYDROmorphone, HYDROmorphone, NON-FORMULARY PAT OWN MED order form, zolpidem    Assessment: 74 y.o. male with <principal problem not specified>    Plan:   REHAB: Continue comprehensive and intensive inpatient rehab program, including:   Physical therapy 60-120 min daily, 5-6 times per week, Occupational therapy  60-120 min daily, 5-6 times per week, Case management and Rehabilitation nursing    Will continue to address the following impairments and issues:  Mobility, ADLs and Adherence to precautions    S/p L4-S1 Laminectomy and fusion  -controlled on current regimen  -dry dressings to wound.    Aseptic inflammation of knee:   -Lidoderm patch q12h    Insomina:   Home suvorexant (Belsomra) 20mg  QHS prn    HTN: Home amlodipine and lisinopril. Assess daily.    Barriers to home discharge, improved adherence to precautions, will need intermittent supervision. Will go home with daughter.     Team conference today.  I have discussed the plan of care with the resident physician and examined patient independently.  I have reviewed the note above  and concur with findings and plan.  Additional comments are:  Observed in tx.  Doing well. Discussed with the pt re: status and t.x. Team Conf held this pm.  Report completed.

## 2015-11-22 NOTE — Rehab Progress Note ([facility]) (Signed)
Matthew [name]  MRN: [id number]  Account: [id number]  Session Start: 11/22/2015 10:00:00 AM  Session Stop: 11/22/2015 11:00:00 AM    Occupational Therapy  Inpatient Rehabilitation Progress Note - Brief    Rehab Diagnosis: Degenerative lumbar spinal stenosis, Spondylolisthesis of  lumbar region, s/p fusion L4-S1  Demographics:            Age: 53Y            Gender: Male  Rehabilitation Precautions/Restrictions:   Fall, skin and pressure ulcer, spinal precaution    SUBJECTIVE  Patient Report: "I feel great. I has a good night sleep last night."  Pain: Patient currently has pain.  Patient reports a pain level of 3 out of 10. Repositioned patient.    OBJECTIVE    Interventions:       Therapeutic Activities:  Pt was supervision for bed mob demonstrating good  carryover of log roll tech x 3 trials. Supervision fxl transfer/mob to BR from  bed with RW with min cues for safety with RW and hand placement. Pt don socks  and shoes seated with setup cross over leg tech to maintain back precautions. Pt  tolerated sit to stand from low mat x 6 trials with supervision and cues for  hand placement. Dry shower transfer with CGA/SBA for step to shower seat.  Supervision for standard height toilet transfer with RW. Pt was supervision for  fxl household mobility with RW transferring and hanging clothes in closet.  Pain Reassessment:  Response to Pain Intervention: fair  Post Intervention Pain Quality:  sore  Patient Reports Post Intervention Pain Level of: 4 out of 10  Pain Acceptable: Yes    Education Provided:    Education Provided: Precautions. Activities of daily living. Bed mobility.  Functional transfers. Safety.       Audience: Patient.       Mode: Explanation.  Demonstration.       Response: Applied knowledge.  Verbalized understanding.  Needs practice.    ASSESSMENT  Pt agreeable to OT. See above for details. Pt demonstrating increased act  tolerance and balance report decreased pain. Pt will cont to benefit from OT  to  address fxl transfers and increase end and balance to max independence with self  care and fxl transfers in prep for safe d/c home.    Functional Measures      TOILETING: Toileting Score = 5.  Patient is supervision/set-up for toileting,  requiring: Stand by assistance.  Patient requires the following assistive device(s):  Adaptive device to maintain  balance.    TRANSFER TOILET: Toilet Transfer Score = 5.  Patient is supervision/set-up for  transferring to and from the toilet/commode, requiring: Stand by assistance.  Patient requires the following assistive device(s):  Walker.    PLAN  Continued Occupational Therapy is recommended.  Recommended Frequency/Duration/Intensity: 5-6days/wk, 60-120 min/day, 10 -14  days  Continued Activities Contributing Toward Care Plan: balance, strengthening, ther  ax,  self care management, fxl trasnfers, AE/DME training, Family training in  prep for d/c home    3 Hour Rule Minutes: 60 minutes of OT treatment this session count towards  intensity and duration of therapy requirement. Patient was seen for the full  scheduled time of OT treatment this session.  Therapy Mode Minutes: Individual: 60 minutes.    Signed by: Retia Passe, OTR/L 11/22/2015 11:00:00 AM

## 2015-11-23 NOTE — Rehab Progress Note ([facility]) (Signed)
NAMEOWENS HARA  MRN: [id number]  Account: [id number]  Session Start: 11/23/2015 12:00:00 AM  Session Stop: 11/23/2015 12:00:00 AM    SEVERE SEPSIS SCREEN  INFECTION:  Patient has no indication of infection.  Negative Sepsis Screen.  If you are unable to assess a system's dysfunction because you do not have labs,  or the labs you have are not current (within 24 hours), call physician and  request and order for the lab tests needed.    Signed by: Elesa Massed, RN 11/23/2015 8:48:00 AM

## 2015-11-23 NOTE — Rehab Progress Note ([facility]) (Signed)
NAMEHEIDI [name]  MRN: [id number]  Account: [id number]  Session Start: 11/23/2015 12:00:00 AM  Session Stop: 11/23/2015 12:00:00 AM    SEVERE SEPSIS SCREEN  INFECTION:  Patient has no indication of infection.  Negative Sepsis Screen.  If you are unable to assess a system's dysfunction because you do not have labs,  or the labs you have are not current (within 24 hours), call physician and  request and order for the lab tests needed.    Signed by: Candida Peeling, RN 11/23/2015 9:25:00 PM

## 2015-11-23 NOTE — Rehab Progress Note ([facility]) (Signed)
NAMEESCO [name]  MRN: [id number]  Account: [id number]  Session Start: 11/23/2015 3:00:00 PM  Session Stop: 11/23/2015 4:00:00 PM    Physical Therapy  Inpatient Rehabilitation Progress Note - Brief    Rehab Diagnosis: Degenerative lumbar spinal stenosis, Spondylolisthesis of  lumbar region, s/p fusion L4-S1  Demographics:            Age: 39Y            Gender: Male  Rehabilitation Precautions/Restrictions:   Fall, skin and pressure ulcer, spinal precaution    SUBJECTIVE  Pain: Patient currently without complaints of pain.    OBJECTIVE    Interventions:       Therapeutic Activities:  Pt tx focused on locomotion training to imprve  safety on steps, pt negotiated 20 steps with HR and step to pattern each  ascending and descending, sup/SBA. Pt ambulated 220ft x2 reps with RW and min  cues for posture, sup/SBA. Pt perofmred supine TA contractions with emphasis on  counting out load to avoid holding breath. Pt performed supine TA contractions  with marching using same technique as above. Pt performed sit to/from stand with  stick on back as tactiel cues for proper posture/technique for sit to/from stand  from raised mat table. Pt returned to room with call bell in reach and all needs  met  Pain Reassessment: Pain was not reassessed as no pain was reported.    ASSESSMENT  Pt demo improving indep with stairs and HEP, will benefit from ongoing training  to improve indep with all MRADLs    PLAN  Continued Physical Therapy is recommended.  Recommended Frequency/Duration/Intensity: 60-120 minutes per day, 5-6 days per  week for 2 weeks from initial eval on 11/19/15.  Activities Contributing Toward Care Plan: therapeutic exercise, therapeutic  activities, balance training, pain management (including modalities), bed  mobility training, precautions training, ambulation training, stair negotiation,  DME assessment, discharge planning in a multidisciplinary team    3 Hour Rule Minutes: 60 minutes of PT treatment this session  count towards  intensity and duration of therapy requirement. Patient was seen for the full  scheduled time of PT treatment this session.  Therapy Mode Minutes: Individual: 60 minutes.    Signed by: Delsa Grana, PT, DPT, NCS 11/23/2015 4:00:00 PM

## 2015-11-23 NOTE — Progress Notes (Signed)
Home Health Referral          Referral from [facility] (Case Manager) for home health care upon discharge.    By [employer], the patient has the right to freely choose a home care provider.  Arrangements have been made with:     A company of the patients choosing. We have supplied the patient with a listing of providers in your area who asked to be included and participate in Medicare.   [facility], a home care agency that provides both adult home care services which is a wholly owned and operated by [employer] and participates in [employer]   The preferred provider of your insurance company. Choosing a home care provider other than your insurance company's preferred provider may affect your insurance coverage.    The Home Health Care Referral Form acknowledging the voluntary selection of the home care company has been completed, signed, and is on file.      Home Health Discharge Information     Your doctor has ordered Skilled Nursing, Physical Therapy and Occupational Therapy in-home service(s) for you while you recuperate at home, to assist you in the transition from hospital to home.    The agency that you or your representative chose to provide the service:  Name of Home Health Agency: [facility] [phone])     The above services were set up by:  Daiva Eves  [facility] Liaison)   Phone [phone]    Signed by: Daiva Eves  Date Time: 11/23/2015 4:28 PM

## 2015-11-23 NOTE — Rehab Progress Note ([facility]) (Addendum)
Corrected 11/23/2015 7:29:04 PM    NAME: Matthew [name]  MRN: [id number]  Account: [id number]  Session Start: 11/23/2015 12:00:00 AM  Session Stop: 11/23/2015 12:00:00 AM    Rehabilitation Nursing  Inpatient Rehabilitation Shift Assessment    Rehab Diagnosis: Degenerative lumbar spinal stenosis, Spondylolisthesis of  lumbar region, s/p fusion L4-S1  Demographics:            Age: 74Y            Gender: Male  Primary Language: English    Date of Onset:  11/10/15  Date of Admission: 11/18/2015 2:21:00 PM    Rehabilitation Precautions Restrictions:   Fall, skin and pressure ulcer, spinal precaution    Patient Report: "i feel ok but I'd like some pain medication"  Pain: Patient currently has pain.  Location: back  Type: Acute  Quality: Aching.  Stabbing.  Pain Scale: Numeric.  Patient reports a pain level of 4 out of 10.  Patient's acceptable level of pain 2 out of 10.   Pain does not interfere with any activity at this time.  Pain is alleviated by: medication, rest  Pain is exacerbated by: activity Patient medicated.  Pain Reassessment:  Response to Pain Intervention: pain improved to tolerable  Post Intervention Pain Quality:  Aching.  Patient Reports Post Intervention Pain Level of: 2 out of 10  Pain Acceptable: Yes  Patient/Caregiver Goals:  Reviewed pt s goals, in therapy wiht pt, per pt , " I  need to do the stairs at my daughter's house, and move around on my own."    NEURO  Orientation/Awareness: Alert and Oriented x4.  Speech: Clear.  Behavior: Cooperative.    MEDICATIONS  IV Access: No IV access.  Dialysis Access: Patient does not have dialysis access.      Elopement Risk Level Assessment Tool  Patient Criteria:  CATEGORY 1 CRITERIA:   No category 1 criteria. Elopement Risk:  No      RISK ASSESSMENT FOR FALLS/INJURY    MENTAL STATUS CRITERIA:   0 - None identified.  MENTAL STATUS TOTAL: 0    AGE CRITERIA:   24 - 36-75 years old  AGE TOTAL: 1    ELIMINATION CRITERIA:   3 - Toileting with Assistance.   ELIMINATION  TOTAL: 3    HISTORY OF FALLS CRITERIA:   5 - Has fallen one time.  HISTORY OF FALLS TOTAL: 5    MEDICATIONS CRITERIA:   2 or more High Risk Medications (*see list below)   MEDICATIONS TOTAL: 2    PHYSICAL MOBILITY CRITERIA:   1 - Weakness/impaired physical mobility.   PHYSICAL MOBILITY TOTAL: 1    FALLS RISK ASSESSMENT TOTAL: 12    Patient's Fall Risk: TOTAL SCORE >10: High Risk    Falls Interventions: Clutter removed and clear path to BR.  Call bell, phone, glasses, etc within reach.  Hourly toileting/safety checks between 6am and 10pm, then every 2 hours.  Initiate Fall care plan and outcome.  Pt and family education.  Assistive devices at [facility].  Bed alarm    NUTRITION  Diet:  Type: Regular.  Food Consistency: Regular.  Liquid Consistency: Thin.      CARDIOVASCULAR     Bilateral lower extremities  Nail Bed Color: Pink.   No edema or redness present.  Pulses:   Apical Pulse: Regular. Strong. Rate is 90s .   Patient does not have a pacemaker.   Patient does not have a defibrillator.    CARDIOPULMONARY  Lung Sounds:  Upper lobes. Clear.   Lower lobes. Clear.  Type of Respirations: Regular.  Cough: No cough noted.  Respiratory Support: The patient does not require any respiratory support.  Respiratory Equipment: 98    INTEGUMENTARY  Skin:  Temperature: Warm  Turgor: Normal for age  Moisture: Dry  Color of skin: Normal for Race/Ethnicity  Capillary Refill: Greater than 3 seconds  Wounds/Incisions:     Surgical Incision: Back incision. Length: 12 centimeter(s) with glue. No signs  of infection.  Drainage: Incision without drainage.  Odor:  No  Incision Care: Per protocol.  Braden Scale for Predicting Pressure Sore Risk: Sensory Perception: Slightly  limited  Moisture: Rarely moist  Activity: Walks occasionally  Mobility: Slightly limited  Nutrition: Adequate  Friction and Shear: No apparent problem  Braden Score: 19  Level of Risk: No risk (19-23). Will reassess every shift.    GASTROINTESTINAL  Abdomen: Soft.  Bowel  Sounds:  Active bowel sounds audible in all four quadrants.  Date of Last Bowel Movement:  12/14  Current Bowel Pattern:  Continent   No Problems/Complaints with Bowel Elimination Assessed.    GENITOURINARY  Current Bladder Pattern: Continent  Color:  Yellow   Patient denies problems with urination and/or catheter.    Bowel and Bladder Output:                       Bladder (# only)             Bowel (# only)  Number of Episodes  Continent            4                            1  Incontinent          0                            0    MUSCULOSKELETAL  Upper Body: WNL  Lower Body: BLE weakness, RLE limited mobility due to knee pain    Functional Measures    EATING: Eating Score = 7. Patient is completely independent for eating.  There  are no activity limitations.    BLADDER MANAGEMENT - LEVEL OF ASSIST: Bladder Score = 5.  Patient is  supervision/set-up for bladder management, requiring: Setting out equipment.  Emptying equipment.  Patient requires the following assistive device(s): Urinal.    BLADDER ACCIDENTS THIS SHIFT:  0 . Patient has not had an accident but used a  urinal this assessment.    BOWEL MANAGEMENT - LEVEL OF ASSIST: Bowel Score = 6.  Patient is modified  independent for bowel management.  Patient did not have bowel movement.  Medication/intervention was provided.    BOWEL ACCIDENTS THIS SHIFT: 0 . Patient has not had an accident, but used a  stool softener.    Education Provided:    Education Provided: Precautions. Pain management. Pain scale. Side effects.  Medication options. Plan of care. Safety issues and interventions. Fall  protocol. Skin/wound care. Prevention of skin breakdown. Medication. Name and  dosage. Purpose. Side Effects.       Audience: Patient.       Mode: Explanation.  Teacher, English as a foreign language provided.       Response: Needs reinforcement.    Discharge: Patient is not being discharged at this time.    Long Term Goals: 1. Patient will call for  help 100% of time before getting OOB  inorder  to avoid fall during his recovery time   2. Patient will verbalize pain 2/10 before and during therapy with the help of  current pain medication   3. Patient's surgica incision will heal with no complication   4. Patient will identify at least 2 sideffects of his medications inorder to  get prompt intervention  2 weeks from 11/18/15  Short Term Goals: 1. Patient will call for help 100% of time before getting OOB  inorder to avoid fall during his recovery time - Goal Met   2. Patient will verbalize pain 2/10 before and during therapy with the help of  current pain medication - Goal Not Met   3. Patient will identify at least 2 sideffects of 75% his medications inorder  to get prompt intervention - Goal Not Met  1 week from 11/18/15    PROGRESS TOWARD GOALS: patient calls prior to activity at all times on day  shift. patient reports pain without prompting and requests pain medication  appropriately. Reviewed side effects with patient, side effect sheet provided.    PLAN: Nursing Specific Interventions  Medical Condition Management. Medication Management. Pain Management. Skin  Management. Wound Management.  Continue with the current Nursing Plan of Care.    TEAM CARE PLAN  Identified problems from team documentation:  Problem: Impaired Mobility  Mobility: Primary Team Goal: Pt will perform majority of mobility at a mod I  level upon discharge with most appropriate assisstive device./Active    Problem: Impaired Pain Management  Pain Mgmt: Primary Team Goal: Patient's back discomfort will not impact  functional tasks 100% of the time/Active    Problem: Impaired Self-care Mgmt/ADL/IADL  Self Care: Primary Team Goal: Pt will be mod I for ADL and fxl transfer with  LRD/Active    Problem: Impaired Skin/Wound Mgmt  Skin Wound: Primary Team Goal: Patient and caregiver will be able perrform demo  on back mgt and incisional care without diff. prior to discharge/Active    Add/Update Problems from this assessment:  No updates at  this time.    Please review Integrated Patient View Care Plan Flowsheet for Team identified  Problems, Interventions, and Goals.    Signed by: Elesa Massed, RN 11/23/2015 10:33:00 AM

## 2015-11-23 NOTE — Progress Notes (Signed)
[facility] Physiatry Attending Face to Face Progress Note   Functional Status/Update:   I reviewed patient's therapy notes to assess functional status and ongoing need for therapies. Of note, Therapeutic Activities: Pt tx focused on locomotion training to imprve  safety on steps, pt negotiated 20 steps with HR and step to pattern each  ascending and descending, sup/SBA. Pt ambulated 212ft x2 reps with RW and min  cues for posture, sup/SBA. Pt perofmred supine TA contractions with emphasis on  counting out load to avoid holding breath. Pt performed supine TA contractions  with marching using same technique as above. Pt performed sit to/from stand with  stick on back as tactiel cues for proper posture/technique for sit to/from stand  from raised mat table.    Activities of Daily Living  Feeding: Patient is independent with eating. No equipment or helper.  Grooming: Modified independent. Patient requires device or extra time. Patient  retrieved items. The patient used the following equipment: None standing -  seated for shaving at sink  Bathing: Supervision. Patient requires set up, handling supplies, and/or cueing.  The patient used the following equipment: None  Upper Body Dressing: Modified independent. Pt requires device or extra time.  Patient retrieved items. The patient used the following equipment: None  Lower Body Dressing: Supervision. Patient requires setup, handling supplies,  and/or cueing. Helper applies orthotic/prosthetic. The patient used the  following equipment: None  Toileting: Supervision. Patient requires setup, handling supplies and/or cueing.  The patient used the following equipment: adaptive device for stability  Toilet Transfer: Supervision. Patient requires setup of equipment/wheelchair  and/or cueing The patient used the following equipment: adaptive device for  steadying  Tub/ShowerTransfers: Patient transferred to shower. Supervision. Patient  requires setup of equipment/wheelchair and/or  cueing The patient used the  following equipment: shower chair, adaptive device for steadying    Subjective:   The patient reports adequate eating and drinking.  Patient denies any new pain.  No new issues with bowel and bladder.    Objective:   Filed Vitals:    11/22/15 0618 11/22/15 0828 11/22/15 1648 11/23/15 1702   BP: 108/62 113/63 107/69 102/56   Pulse: 89 76 91 90   Temp: 98 F (36.7 C)  99.3 F (37.4 C) 97.9 F (36.6 C)   TempSrc:   Oral Oral   Resp: 18  16 16    Height:       Weight:       SpO2: 94%  98% 98%       Physical Examination:   Appears well.  In no acute distress. Normal body habitus. Appears stated age.   Anicteric sclerae. Conjunctivae non-injected. EOMI.   Symmetric facies. Hearing grossly intact. Dentition in good repair. Moist mucous membranes.   +S1S2 Heart rate and rhythm are regular. No murmurs/rubs/gallops. No significant lower limb edema.   Lungs are clear to auscultation bilaterally. No wheezes, rales, or rhonchi. Good respiratory effort.   Soft. Non-tender. Normoactive bowel sounds.   Alert Pleasant and cooperative     New Labs:  Results     ** No results found for the last 24 hours. **          Current medications:   Scheduled Meds:  Current Facility-Administered Medications   Medication Dose Route Frequency   . amLODIPine  2.5 mg Oral Daily   . atorvastatin  10 mg Oral QHS   . docusate sodium  100 mg Oral BID   . hydrocortisone   Topical BID   .  lidocaine  1 patch Transdermal Q24H   . lisinopril  5 mg Oral Daily   . morphine  15 mg Oral Q8H   . multivitamin  1 tablet Oral Daily   . pantoprazole  40 mg Oral Daily   . pramipexole  0.25 mg Oral Q12H     PRN Meds:.ALPRAZolam, cyclobenzaprine, dextromethorphan-guaiFENesin, HYDROmorphone, HYDROmorphone, NON-FORMULARY PAT OWN MED order form, zolpidem    Assessment: 74 y.o. male with History of lumbar laminectomy for spinal cord decompression    Plan:   REHAB: Continue comprehensive and intensive inpatient rehab program, including:    Physical therapy 60-120 min daily, 5-6 times per week, Occupational therapy  60-120 min daily, 5-6 times per week, Case management and Rehabilitation nursing    Will continue to address the following impairments and issues:  Mobility, ADLs and Adherence to precautions    S/p L4-S1 Laminectomy and fusion.  Spinal precautions.  -pain controlled on current regimen  -dry dressings to wound.    Aseptic inflammation of knee:   -Lidoderm patch q12h    Insomina:   Home suvorexant (Belsomra) 20mg  QHS prn    HTN: Home amlodipine and lisinopril. Assess daily.  Good blood pressure control thus far    Barriers to home discharge, improved adherence to precautions, will need intermittent supervision. Will go home with daughter.     Maple Odaniel Virl Son, MD

## 2015-11-23 NOTE — Rehab Progress Note ([facility]) (Signed)
Matthew [name]  MRN: [id number]  Account: [id number]  Session Start: 11/23/2015 7:00:00 AM  Session Stop: 11/23/2015 9:00:00 AM    Occupational Therapy  Inpatient Rehabilitation Progress Note    Rehab Diagnosis: Degenerative lumbar spinal stenosis, Spondylolisthesis of  lumbar region, s/p fusion L4-S1  Demographics:            Age: 76Y            Gender: Male  Rehabilitation Precautions/Restrictions:   Fall, skin and pressure ulcer, spinal precaution    SUBJECTIVE  Patient Report: "My back pain is getting better."  Pain: Patient currently without complaints of pain.    OBJECTIVE    Activities of Daily Living  Feeding: Patient is independent with eating. No equipment or helper.  Grooming: Modified independent. Patient requires device or extra time. Patient  retrieved items. The patient used the following equipment: None standing -  seated for shaving at sink  Bathing: Supervision. Patient requires set up, handling supplies, and/or cueing.  The patient used the following equipment: None  Upper Body Dressing: Modified independent. Pt requires device or extra time.  Patient retrieved items. The patient used the following equipment: None  Lower Body Dressing: Supervision. Patient requires setup, handling supplies,  and/or cueing. Helper applies orthotic/prosthetic. The patient used the  following equipment: None  Toileting: Supervision. Patient requires setup, handling supplies and/or cueing.  The patient used the following equipment: adaptive device for stability  Toilet Transfer: Supervision. Patient requires setup of equipment/wheelchair  and/or cueing The patient used the following equipment: adaptive device for  steadying  Tub/ShowerTransfers: Patient transferred to shower. Supervision. Patient  requires setup of equipment/wheelchair and/or cueing The patient used the  following equipment: shower chair, adaptive device for steadying    Functional Measures (other than ADL tasks)      TRANSFER TUB: Tub Transfer  Score = 5.  Patient is supervision/set-up for  transferring to and from the tub, requiring: Stand by assistance.  Patient requires the following assistive device(s):  Walker.  Shower chair. dry tub transfer stand step in with UE support on wall for  transfer    Interventions:       Self Care/Home Management:  Pt completed shower ADL and grooming at sink  with supervision overall. See functional measures for details. Pt was  supervision OOB with RW, gathering clothes from closet with cues for RW safety  during task. Pt required seated rest break during standing shaving task at sink.  Educated importance of implementing rest breaks throughout all activities and  having a chair placed in BR at home during ADLs. Pt completed dry tub transfer  in prep for when he returns to his home. Completed standard bed transfer with  emphasis on log roll into bed to help reduced back pain. Pt was min A first  trial and supervision second trial demonstrating good carryover of log roll  tech. Spoke with daughter on phone with pt present re: d/c home friday,  recommendations and pt current status.  Both pt and daughter feel prepared for  d/c home with her support.       Therapeutic Exercise:  Pt completed bil UE ther ex with 1# isolating one UE  at a time focus on proper sitting posture during exercise. Pt tol 10 reps x 3  sets each seated.  Pain Reassessment:  Response to Pain Intervention:  Post Intervention Pain Quality:  sore  Patient Reports Post Intervention Pain Level of: 3 out of 10  Pain Acceptable: Yes  Education Provided:    Education Provided: Precautions. Activities of daily living. Bed mobility.  Functional transfers. Safety.       Audience: Patient and Caregiver       Mode: Explanation.  Demonstration.  Telephone.       Response: Applied knowledge.  Verbalized understanding.  Demonstrated skill.  Needs practice.    ASSESSMENT  Pt agreeable to OT tx. See above for tx details. Pt making good progress and  reporting  decreased back pain. Pt demonstrating increased independence with fxl  transfers and self care management. Pt on target for d/c home Friday to  daughters home. Cont POC as tolerated.      PLAN  Continued Occupational Therapy is recommended.  Recommended Frequency/Duration/Intensity: 5-6days/wk, 60-120 min/day, 10 -14  days  Continued Activities Contributing Toward Care Plan: balance, strengthening, ther  ax,  self care management, fxl transfers, AE/DME training, Family training in  prep for d/c home  Patient is appropriate for treatment in gait or mobility group.      3 Hour Rule Minutes: 60 minutes of OT treatment this session count towards  intensity and duration of therapy requirement. Patient was seen for the full  scheduled time of OT treatment this session.  Therapy Mode Minutes: Individual: 60 minutes.    Signed by: Retia Passe, OTR/L 11/23/2015 9:00:00 AM

## 2015-11-23 NOTE — Discharge Instructions (Signed)
Home Health Discharge Information     Your doctor has ordered Skilled Nursing, Physical Therapy and Occupational Therapy in-home service(s) for you while you recuperate at home, to assist you in the transition from hospital to home.    The agency that you or your representative chose to provide the service:  Name of Home Health Agency: [facility] [phone])     The above services were set up by:  Daiva Eves  [facility] Liaison)   Phone [phone]

## 2015-11-24 NOTE — Rehab Discharge Summary ([facility]) (Signed)
NAMESABIR CHARTERS  MRN: [id number]  Account: [id number]  Session Start: 11/24/2015 8:00:00 AM  Session Stop: 11/24/2015 9:00:00 AM    Occupational Therapy  Inpatient Rehabilitation Discharge Summary and Note    Rehab Diagnosis: Degenerative lumbar spinal stenosis, Spondylolisthesis of  lumbar region, s/p fusion L4-S1  Demographics:            Age: 74Y            Gender: Male    Past Medical History: Hypertension controlled  Syncopal episodes  approx 2012  1x episode - had negative cardiac work-up - no episodes sinice  BPH (benign prostatic hypertrophy)  2007  w/ hx of obstruction - s/p suprapubic prostatectomy  History of pancreatitis  2007  had microscopic stones - ERCP w/ stent done  Spinal stenosis  Spondylolisthesis  Fibromyalgia  in remission  Gastroesophageal reflux disease  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 male admitted to [facility] 11/10/15 with hx chronic  pancreatitis, BPH, HTN who had chest pain before lumbar laminectomy and fusion  on 12/1 by Dr. Marjo Bicker. He had an EKG during the episode that showed no ST  elevations or LBBB. He proceeded with surgery. He states the pain was sharp,  left lower chest / left upper quadrant, and lasted /R/2 minutes. He denies chest  pain, shortness of breath, but notes acid reflux. These symptoms are sudden  onset, moderate intensity, without alleviating factors.  Surgery 12/1:  Lumbosacral fusion  Chest Pain- Etiology unclear however resolved without intervention after 2  minutes and has not recurred   EKG without acute ischemic changes, troponin negative X 1, will trend to rule  out ACS  Does note history of acid reflux which may have contributed to chest pain v  atypical in  etiology   CXR does not show infiltrate or other acute pathology, radiology read pending  No recurrence of chest pain after resolution.   Denies shortness of breath,  abdominal pain, nausea, vomiting, or reflux at this time.  States he is doing  well now.  Plan of care discussed with family at bedside.  12/3- Nausea/Vomiting- Likely due to recent surgery, acid reflux,  Restarted on  home PPI  12/4- Abdominal x-rays show ileus, no evidence of obstruction, will obtain  follow up per radiology recommendations, LFTs and Lipase without significant  acute abnormality, Already ruled out for ACS as above  11/14/15- Follow up abdominal x-rays shows mildly improved since the prior study  favoring a resolving ileus. Clear liquid diet.  Doppler to rule out DVT RLE for  R knee pain 10/10.  RESULTS:  No evidence of deep venous thrombosis involving  the right  lower extremity.  Right Knee effusion with limited range of motion, fevers, and  leukocytosis, Orthopedics performed joint aspiration 12/6: negative for  crystals, WBC 20K, Fluid Cx NGTD,  Knee x-rays show significant effusion  possibly due to inflammatory arthropathy, Surgical site appears C/D/I  Blood cx obtained, UA shows hematuria (possibly due to traumatic cath) and not  consistent with UTI, CXR shows developing right basal infiltrate however patient  without clinical signs/symptoms of  PNA (no cough, sputum production, shortness  of breath)  12/7-Patient complaining of worsening neck pain.  On eval by PT patient with  significant difficulty with passive ROM of Cervical neck.  Mild TTP along spiny  process.  MRI c spine pending  11/17/15-Neck Pain: Hx of Cervical fusion. MRI c spine 12/8 no acute findings.  Will continue symptomatic treatment.  Afebrile overnight.  12/9-Pt participating in therapy and will benefit from acute rehab @ [facility].   Pt  afebrile, medically stable to transfer today.   Date of Onset: 11/10/15   Date of Admission: 11/18/2015 2:21:00 PM  Rehabilitation  Precautions/Restrictions:   Fall, skin and pressure ulcer, spinal precaution    SUBJECTIVE  Patient Report: "I am doing well, sometimes this leg gives out."  Pain: Patient currently without complaints of pain.    OBJECTIVE  General Observation: Pt supine in bed upon arrival.  Agreeable for shower.    Cognition: Pt intact with problem solving, deficit awareness, and  planning/organizing    Functional Measures    EATING: Eating Score = 7. Patient is completely independent for eating.  There  are no activity limitations.    GROOMING: Grooming Score = 6.  Patient is modified independent for grooming,  requiring:    BATHING: Patient bathed in shower. Bathing Score = 5.  Patient is  supervision/set-up for bathing, requiring: Standing by.  Patient requires the following assistive device(s):    UPPER BODY DRESSING: Upper Body Dressing Score = 5. Patient is  supervision/set-up for upper body dressing, requiring: Gathering/setting out  clothes.  Patient requires the following assistive device(s):  No assistive devices were  required.    LOWER BODY DRESSING: Lower Body Dressing Score = 5. Patient is  supervision/set-up for lower body dressing, requiring: Gathering/setting out  clothes.  Patient requires the following assistive device(s): No assistive devices were  required.    TOILETING: Toileting Score = 6.  Patient is modified independent for toileting,  requiring: Safety considerations.    TRANSFER TOILET: Toilet Transfer Score = 6.  Patient is modified independent for  transferring to and from the toilet/commode, requiring: Safety considerations.    TRANSFER SHOWER: Shower Transfer Score = 5.  Patient is supervision/set-up for  transferring to and from the shower, requiring: Stand by assistance. Grab bars.  Shower chair.    COMPREHENSION: Auditory comprehension is the usual mode. Comprehension Score =  7, Independent.  Patient comprehends complex/abstract information in their  primary language.  Patient is completely  independent for auditory comprehension.   There are no activity limitations.    EXPRESSION: Vocal expression is the usual mode. Expression Score = 7,  Independent.  Patient expresses complex/abstract information in their primary  language.  Patient is completely independent for vocal expression.  There are no  activity limitations.    SOCIAL INTERACTION: Social Interaction Score = 6, Modified Independent.  Patient  is modified independent for social interaction, requiring:    PROBLEM SOLVING: Problem Solving Score = 7, Independent.  Patient makes  appropriate decisions in order to solve complex problems.  Patient is completely  independent for problem solving.  There are no activity limitations.    MEMORY: Memory Score = 7, Independent.  Patient is completely independent for  memory.  There are no activity limitations.    CARE Tool Discharge Performance  Self Care:   Eating: Patient completed the activities by him/herself with no assistance from  a helper.   Oral Hygiene: Patient completed the activities by him/herself with no  assistance from a helper.   Toileting Hygiene: : Patient completed the activities by him/herself with no  assistance from a helper.   Shower/Bathe Self: Helper sets up or cleans up; patient completes activity.  Helper assists only prior to or following the activity.   Upper Body Dressing: Helper sets up or cleans up; patient completes activity.  Helper assists only prior to or following the activity.   Lower Body Dressing: Helper sets up or cleans up; patient completes activity.  Helper assists only prior to or following the activity.   Putting On/Taking Off Footwear: Patient completed the activities by him/herself  with no assistance from a helper.  Mobility:   Toilet Transfer Patient completes the activities by him/herself with no  assistance from a helper.    Today's Treatment Interventions:       Self Care/Home Management:  See FIM scores above.  Completed shower ADL.  Pt amb. and  gathered clothing mainly at supervision level with R.W. but required  CGA one time 2/2 LOB with knee buckle.  Equipment Provided/Ordered: pt is to have R.W, tub bench, and w/c for home    Education Provided:    Education Provided: Activities of daily living.       Audience: Patient.       Mode: Explanation.  Demonstration.       Response: Applied knowledge.  Verbalized understanding.    ASSESSMENT  Summary of Progress and Current Status: Pt has made good progress with all  functional mobility tasks.  Pt is making daily improvements in activity  tolerance which increases I with ADL's.  Recommend home OT to focus on home  safety and increasing I with ADL's/IADL's in the home.    Long Term Goals (status prior to discharge): Pt will be mod I with complete  ADL/self care management with LHAE prn to maintain back precautions and reduce  pain.  Pt will be mod I for toilet transfer with LRD  Pt will be Supervision for shower/tub transfer with proper DME  Pt will be S for light home management tasks with LRD  10-14 days from initial OT eval  Short Term Goals (status prior to discharge): Pt will be S for LB ADL with AE  prn to maintain back precautions and reduce pain  Pt will be S for toilet transfer with LRD  1 week from initial OT eval on 12/10    Progress Towards Goals (final status): SHORT TERM GOAL REVIEW:       1. Pt will be S for LB ADL with AE prn to maintain back precautions and  reduce pain - Met        2. Pt will be S for toilet transfer with LRD - Met   LONG TERM GOAL REVIEW:       1. Pt will be mod I with complete ADL/self care management with LHAE prn to  maintain back precautions and reduce pain. - Not Met: pt at supervision level       2. Pt will be mod I for toilet transfer with LRD - Met        3. Pt will be Supervision for shower/tub transfer with proper DME - Met       4. Pt will be S for light home management tasks with LRD - Met    PLAN  Occupational Therapy Plan: Patient is recommended for home health  therapy  services.    Team Care Plan  Please review Integrated Patient View Care Plan  Flowsheet for Team identified  Problems, Interventions, and Goals.    Identified problems from team documentation:  Problem: Impaired Mobility  Mobility: Primary Team Goal: Pt will perform majority of mobility at a mod I  level upon discharge with most appropriate assisstive device./Active    Problem: Impaired Pain Management  Pain Mgmt: Primary Team Goal: Patient's back discomfort will not impact  functional tasks 100% of the time/Active    Problem: Impaired Self-care Mgmt/ADL/IADL  Self Care: Primary Team Goal: Pt will be mod I for ADL and fxl transfer with  LRD/Active    Problem: Impaired Skin/Wound Mgmt  Skin Wound: Primary Team Goal: Patient and caregiver will be able perrform demo  on back mgt and incisional care without diff. prior to discharge/Active    Status update for discharge:     Self Care Management:   Primary Goal: Not Met  Discharge Status Comment:   recommend supervision/mod I with ADL tasks and  functional mobility at this time    3 Hour Rule Minutes: 60 minutes of OT treatment this session count towards  intensity and duration of therapy requirement. Patient was seen for the full  scheduled time of OT treatment this session.  Therapy Mode Minutes: Individual: 60 minutes.    Signed by: Darrold Junker, M.S., OTR/L 11/24/2015 9:00:00 AM

## 2015-11-24 NOTE — Rehab Progress Note ([facility]) (Addendum)
Corrected 11/24/2015 1:14:54 AM    NAME: Matthew [name]  MRN: [id number]  Account: [id number]  Session Start: 11/24/2015 12:00:00 AM  Session Stop: 11/24/2015 12:00:00 AM    Rehabilitation Nursing  Inpatient Rehabilitation Shift Assessment    Rehab Diagnosis: Degenerative lumbar spinal stenosis, Spondylolisthesis of  lumbar region  Demographics:            Age: 31Y            Gender: Male  Primary Language: English    Date of Onset:  11/10/15  Date of Admission: 11/18/2015 2:21:00 PM    Rehabilitation Precautions Restrictions:   Fall, skin and pressure ulcer, spinal precaution    Patient Report: " I feel okay".  Pain: Patient currently has pain.  Location: Lower Back  Type: Acute  Quality: Aching.  Pain Scale: Numeric.  Patient reports a pain level of 5 out of 10.  Patient's acceptable level of pain 2 out of 10.   Interferes with physical activity. sleep.  Pain is alleviated by: Pain medication and repositioning.  Pain is exacerbated by: Movement Patient medicated. Repositioned patient.  Pain Reassessment: Response to Pain Intervention: Patient verbalized acceptable  pain relief.  Post Intervention Pain Quality:  None.  Patient Reports Post Intervention Pain Level of: 0 out of 10  Pain Acceptable: Yes  Patient/Caregiver Goals:  To be able to walk alone with out pain    NEURO  Orientation/Awareness: Alert and Oriented x3.  Speech: Clear.  Behavior: Cooperative.    MEDICATIONS  IV Access: No IV access.  Dialysis Access: Patient does not have dialysis access.      Elopement Risk Level Assessment Tool  Patient Criteria: Patient is not capable of leaving the unit.  Assessment is not  applicable.      RISK ASSESSMENT FOR FALLS/INJURY    MENTAL STATUS CRITERIA:   0 - None identified.  MENTAL STATUS TOTAL: 0    AGE CRITERIA:   68 - 43-8 years old  AGE TOTAL: 1    ELIMINATION CRITERIA:   3 - Toileting with Assistance.   ELIMINATION TOTAL: 3    HISTORY OF FALLS CRITERIA:   2 - Unknown History.  HISTORY OF FALLS TOTAL:  2    MEDICATIONS CRITERIA:   2 or more High Risk Medications (*see list below)   MEDICATIONS TOTAL: 2    PHYSICAL MOBILITY CRITERIA:   3 - Decreased balance reaction.   1 - Weakness/impaired physical mobility.   PHYSICAL MOBILITY TOTAL: 4    FALLS RISK ASSESSMENT TOTAL: 12    Patient's Fall Risk: TOTAL SCORE >10: High Risk    Falls Interventions: Clutter removed and clear path to BR.  Call bell, phone, glasses, etc within reach.  Hourly toileting/safety checks between 6am and 10pm, then every 2 hours.  Initiate Fall care plan and outcome.  Yellow "high risk" patient identification in place: wrist band, socks, chart  sticker, door sign.  Pt and family education.  Assistive devices at [facility].  Pharmacy review of meds.  Bed alarm    NUTRITION  Diet:  Type: Regular.  Food Consistency: Regular.  Liquid Consistency: Thin.      CARDIOVASCULAR     Bilateral lower extremities  Nail Bed Color: Pink.  Pulses:   Apical Pulse: Regular. Strong. Rate is 86 .   Patient does not have a pacemaker.   Patient does not have a defibrillator.    CARDIOPULMONARY  Lung Sounds:   Upper lobes. Clear.   Lower lobes. Clear.  Type of Respirations: Regular.  Cough: No cough noted.  Respiratory Support: The patient does not require any respiratory support.  Respiratory Equipment: None.    INTEGUMENTARY  Skin:  Temperature: Warm  Turgor: Normal for age  Moisture: Dry  Color of skin: Normal for Race/Ethnicity  Capillary Refill: Less than 3 seconds  Wounds/Incisions:     Wound/incision not assessed this shift.   Dressing was changed by day shift RN  D/C/I  Braden Scale for Predicting Pressure Sore Risk: Sensory Perception: No  impairment  Moisture: Rarely moist  Activity: Chairfast  Mobility: Slightly limited  Nutrition: Adequate  Friction and Shear: Potential problem  Braden Score: 18  Level of Risk: At risk (15-18)   Pressure Relief every 30 minutes while in wheelchair   Turn patient every 2 hours   Assist patient to the bathroom every 2  hours    GASTROINTESTINAL  Abdomen: Soft. Nontender.  Bowel Sounds:  Active bowel sounds audible in all four quadrants.  Date of Last Bowel Movement:  11/23/15  Current Bowel Pattern:  Continent   No Problems/Complaints with Bowel Elimination Assessed.    GENITOURINARY  Current Bladder Pattern: Continent  Color:  Yellow Clear   Patient denies problems with urination and/or catheter.    Bowel and Bladder Output:                       Bladder (# only)             Bowel (# only)  Number of Episodes  Continent            3                            0  Incontinent          0                            0    MUSCULOSKELETAL  Upper Body: WNL  Lower Body: BLE weakness, RLE limited mobility due to knee pain    Functional Measures      TOILETING: Toileting Score = 4.  Patient requires minimal assistance for  toileting, such as steadying for balance while cleansing or adjusting clothes.  Patient requires the following assistive device(s):  Grab bar.    BLADDER MANAGEMENT - LEVEL OF ASSIST: Bladder Score = 7. Patient is completely  independent for bladder management. There are no activity limitations.    BLADDER ACCIDENTS THIS SHIFT:  0 . Patient has not had an accident this  assessment and did not require medications or devices.    BOWEL MANAGEMENT - LEVEL OF ASSIST: Bowel Score = 6.  Patient is modified  independent for bowel management.  Patient did not have bowel movement.  Medication/intervention was provided.    BOWEL ACCIDENTS THIS SHIFT: 0 . Patient has not had an accident, but used a  stool softener.    COMPREHENSION: Both ( auditory and visual) modes of comprehension are used  equally. Comprehension Score = 7, Independent.  Patient comprehends  complex/abstract information in their primary language.  Patient is completely  independent for auditory and visual comprehension.  There are no activity  limitations.    EXPRESSION: Both ( vocal and non-vocal) modes of expression are used equally.  Expression Score = 7,  Independent. Patient expresses complex/abstract  information in their primary language.  Patient is completely  independent for  vocal and non-vocal expression.  There are no activity limitations.    SOCIAL INTERACTION: Social Interaction Score = 7, Independent. Patient is  completely independent for social interaction.  There are no activity  limitations.    PROBLEM SOLVING: Problem Solving Score = 7, Independent.  Patient makes  appropriate decisions in order to solve complex problems.  Patient is completely  independent for problem solving.  There are no activity limitations.    MEMORY: Memory Score = 7, Independent.  Patient is completely independent for  memory.  There are no activity limitations.    Education Provided:  Education Provided: Pain management. Pain scale. Medication  options. Side effects. Safety issues and interventions. Fall protocol.  Skin/wound care. Signs/symptoms of infection. Incision care. Prevention of skin  breakdown. Medication. Name and dosage. Administration. Purpose. Side Effects.       Audience: Patient.       Mode: Explanation.       Response: Verbalized understanding.    Discharge: Patient is not being discharged at this time.    Long Term Goals: 1. Patient will call for help 100% of time before getting OOB  inorder to avoid fall during his recovery time   2. Patient will verbalize pain 2/10 before and during therapy with the help of  current pain medication   3. Patient's surgica incision will heal with no complication   4. Patient will identify at least 2 sideffects of his medications inorder to  get prompt intervention  2 weeks from 11/18/15  Short Term Goals: 1. Patient will call for help 100% of time before getting OOB  inorder to avoid fall during his recovery time   2. Patient will verbalize pain 2/10 before and during therapy with the help of  current pain medication   3. Patient will identify at least 2 sideffects of 75% his medications inorder  to get prompt  intervention  1 week from 11/18/15    PROGRESS TOWARD GOALS: Patient is new admit. He is working on goals to be  reviewed 7 days from 11/18/15.    PLAN: Nursing Specific Interventions  Medical Condition Management. Medication Management. Pain Management. Skin  Management. Wound Management.  Continue with the current Nursing Plan of Care.    TEAM CARE PLAN  Identified problems from team documentation:      Add/Update Problems from this assessment:  No updates at this time.    Please review Integrated Patient View Care Plan Flowsheet for Team identified  Problems, Interventions, and Goals.    Signed by: Candida Peeling, RN 11/24/2015 1:20:00 AM

## 2015-11-24 NOTE — Rehab Progress Note ([facility]) (Addendum)
Corrected 11/24/2015 6:35:46 PM    NAME: Matthew [name]  MRN: [id number]  Account: [id number]  Session Start: 11/24/2015 12:00:00 AM  Session Stop: 11/24/2015 12:00:00 AM    Rehabilitation Nursing  Inpatient Rehabilitation Shift Assessment    Rehab Diagnosis: Degenerative lumbar spinal stenosis, Spondylolisthesis of  lumbar region, s/p fusion L4-S1  Demographics:            Age: 74Y            Gender: Male  Primary Language: English    Date of Onset:  11/10/15  Date of Admission: 11/18/2015 2:21:00 PM    Rehabilitation Precautions Restrictions:   Fall, skin and pressure ulcer, spinal precaution    Patient Report: "I feel ok and my pain is tolerable"  Pain: Patient currently has pain.  Location: lower back  Type: Acute  Quality: Aching.  Pain Scale: Numeric.  Patient reports a pain level of 4 out of 10.  Patient's acceptable level of pain 2 out of 10.   Interferes with physical activity.  Pain is alleviated by: medication. repositioning, relieving pressure  Pain is exacerbated by: activity, pressure (including from waistbands of  clothes) Patient medicated.  Pain Reassessment:  Response to Pain Intervention: pain improved  Post Intervention Pain Quality:  Aching.  Patient Reports Post Intervention Pain Level of: 3 out of 10  Pain Acceptable: Yes  Patient/Caregiver Goals:  To be able to walk alone with out pain    NEURO  Orientation/Awareness: Alert and Oriented x2.  Alert and Oriented x4.  Speech: Clear.  Behavior: Cooperative.    MEDICATIONS  IV Access: No IV access.  Dialysis Access: Patient does not have dialysis access.      Elopement Risk Level Assessment Tool  Patient Criteria:  CATEGORY 1 CRITERIA:   No category 1 criteria. Elopement Risk:  No      RISK ASSESSMENT FOR FALLS/INJURY    MENTAL STATUS CRITERIA:   0 - None identified.  MENTAL STATUS TOTAL: 0    AGE CRITERIA:   75 - 75-76 years old  AGE TOTAL: 1    ELIMINATION CRITERIA:   3 - Toileting with Assistance.   ELIMINATION TOTAL: 3    HISTORY OF FALLS  CRITERIA:   5 - Has fallen one time.  HISTORY OF FALLS TOTAL: 5    MEDICATIONS CRITERIA:   2 or more High Risk Medications (*see list below)   MEDICATIONS TOTAL: 2    PHYSICAL MOBILITY CRITERIA:   1 - Weakness/impaired physical mobility.   3 - Decreased balance reaction.   PHYSICAL MOBILITY TOTAL: 4    FALLS RISK ASSESSMENT TOTAL: 15    Patient's Fall Risk: TOTAL SCORE >10: High Risk    Falls Interventions: Clutter removed and clear path to BR.  Call bell, phone, glasses, etc within reach.  Hourly toileting/safety checks between 6am and 10pm, then every 2 hours.  Initiate Fall care plan and outcome.  Pt and family education.  Assistive devices at [facility].  Bed alarm    NUTRITION  Diet:  Type: Regular.  Food Consistency: Regular.  Liquid Consistency: Thin.      CARDIOVASCULAR     Bilateral lower extremities  Nail Bed Color: Pink.   No edema or redness present.  Pulses:   Apical Pulse: Regular. Strong. Rate is 70s .   Patient does not have a pacemaker.   Patient does not have a defibrillator.    CARDIOPULMONARY  Lung Sounds:   Upper lobes. Clear.   Lower lobes. Clear.  Type of Respirations: Regular.  Cough: No cough noted.  Respiratory Support: The patient does not require any respiratory support.  Respiratory Equipment: None.    INTEGUMENTARY  Skin:  Temperature: Warm  Turgor: Normal for age  Moisture: Dry  Color of skin: Normal for Race/Ethnicity  Capillary Refill: Less than 3 seconds  Wounds/Incisions: No wounds or incisions.  Braden Scale for Predicting Pressure Sore Risk: Sensory Perception: No  impairment  Moisture: Rarely moist  Activity: Walks frequently  Mobility: No limitation  Nutrition: Excellent  Friction and Shear: Potential problem  Braden Score: 22  Level of Risk: No risk (19-23). Will reassess every shift.    GASTROINTESTINAL  Abdomen: Soft.  Bowel Sounds:  Active bowel sounds audible in all four quadrants.  Date of Last Bowel Movement:  12/14  Current Bowel Pattern:  Continent   No Problems/Complaints  with Bowel Elimination Assessed.    GENITOURINARY  Current Bladder Pattern: Continent  Color:  Yellow   Patient denies problems with urination and/or catheter.    Bowel and Bladder Output:                       Bladder (# only)             Bowel (# only)  Number of Episodes  Continent            4                            0  Incontinent          0                            0    MUSCULOSKELETAL  Upper Body: WNL  Lower Body: BLE weakness, RLE limited mobility due to knee pain    Functional Measures    EATING: Eating Score = 7. Patient is completely independent for eating.  There  are no activity limitations.    TOILETING: Toileting Score = 5.  Patient is supervision/set-up for toileting,  requiring: Stand by assistance.  Patient requires the following assistive device(s):  Grab bar.    BLADDER MANAGEMENT - LEVEL OF ASSIST: Bladder Score = 7. Patient is completely  independent for bladder management. There are no activity limitations.    BLADDER ACCIDENTS THIS SHIFT:  0 . Patient has not had an accident this  assessment and did not require medications or devices.    BOWEL MANAGEMENT - LEVEL OF ASSIST: Bowel Score = 6. Patient is modified  independent for bowel management requiring: Stool softeners    BOWEL ACCIDENTS THIS SHIFT: 0 . Patient has not had an accident, but used a  stool softener.    Education Provided:    Education Provided: Precautions. Pain management. Pain scale. Medication  options. Side effects. Plan of care. Safety issues and interventions. Fall  protocol. Skin/wound care. Prevention of skin breakdown. Incision care.  Medication. Name and dosage. Purpose. Side Effects.       Audience: Patient.       Mode: Explanation.       Response: Needs reinforcement.    Discharge: Patient is not being discharged at this time.    Long Term Goals: 1. Patient will call for help 100% of time before getting OOB  inorder to avoid fall during his recovery time   2. Patient will verbalize pain 2/10 before and during  therapy with the help of  current pain medication   3. Patient's surgica incision will heal with no complication   4. Patient will identify at least 2 sideffects of his medications inorder to  get prompt intervention  2 weeks from 11/18/15  Short Term Goals: 1. Patient will call for help 100% of time before getting OOB  inorder to avoid fall during his recovery time - Goal Met   2. Patient will verbalize pain 2/10 before and during therapy with the help of  current pain medication - Goal Not Met   3. Patient will identify at least 2 sideffects of 75% his medications inorder  to get prompt intervention - Goal Not Met  1 week from 11/18/15    PROGRESS TOWARD GOALS: patient reports ongoing pain on prn dilaudid. provided  patient with prn flexeril  as ordered as well and patient reports relief. will  continue to assess for pain and provide meds as needed as ordered.    Patient uses call bell 100% of the time for assistance so far this shift.  patient is alert and oriented and aware of activity limits    PLAN: Nursing Specific Interventions  Medical Condition Management. Medication Management. Pain Management. Skin  Management. Wound Management.  Continue with the current Nursing Plan of Care.    TEAM CARE PLAN  Identified problems from team documentation:  Problem: Impaired Mobility  Mobility: Primary Team Goal: Pt will perform majority of mobility at a mod I  level upon discharge with most appropriate assisstive device./Active    Problem: Impaired Pain Management  Pain Mgmt: Primary Team Goal: Patient's back discomfort will not impact  functional tasks 100% of the time/Active    Problem: Impaired Self-care Mgmt/ADL/IADL  Self Care: Primary Team Goal: Pt will be mod I for ADL and fxl transfer with  LRD/Active    Problem: Impaired Skin/Wound Mgmt  Skin Wound: Primary Team Goal: Patient and caregiver will be able perrform demo  on back mgt and incisional care without diff. prior to discharge/Active    Add/Update Problems  from this assessment:  No updates at this time.    Please review Integrated Patient View Care Plan Flowsheet for Team identified  Problems, Interventions, and Goals.    Signed by: Elesa Massed, RN 11/24/2015 9:59:00 AM

## 2015-11-24 NOTE — Rehab Progress Note ([facility]) (Signed)
[name]  MRN: [id number]  Account: [id number]  Session Start: 11/24/2015 12:00:00 AM  Session Stop: 11/24/2015 12:00:00 AM    SEVERE SEPSIS SCREEN  INFECTION:  Patient has no indication of infection.  Negative Sepsis Screen.  If you are unable to assess a system's dysfunction because you do not have labs,  or the labs you have are not current (within 24 hours), call physician and  request and order for the lab tests needed.    Signed by: Elesa Massed, RN 11/24/2015 8:04:00 AM

## 2015-11-24 NOTE — Rehab Progress Note ([facility]) (Signed)
Matthew [name]  MRN: [id number]  Account: [id number]  Session Start: 11/24/2015 12:00:00 AM  Session Stop: 11/24/2015 12:00:00 AM    Rehabilitation Nursing  Inpatient Rehabilitation Interaction Note    Rehab Diagnosis: Degenerative lumbar spinal stenosis, Spondylolisthesis of  lumbar region, s/p fusion L4-S1  Demographics:            Age: 47Y            Gender: Male  Primary Language: English    Date of Onset:  11/10/15  Date of Admission: 11/18/2015 2:21:00 PM    Rehabilitation Precautions Restrictions:   Fall, skin and pressure ulcer, spinal precaution    Patient Report: "i have tolerable pain"    Education Provided:    Education Provided: No education provided this session.    Interaction: dressing changed and incision care done. patient tolerated well.    Signed by: Elesa Massed, RN 11/24/2015 6:31:00 PM

## 2015-11-24 NOTE — Rehab Progress Note ([facility]) (Signed)
NAMEYANIS LARIN  MRN: [id number]  Account: [id number]  Session Start: 11/24/2015 12:00:00 AM  Session Stop: 11/24/2015 12:00:00 AM    SEVERE SEPSIS SCREEN  INFECTION:  Patient has no indication of infection.  Negative Sepsis Screen.  If you are unable to assess a system's dysfunction because you do not have labs,  or the labs you have are not current (within 24 hours), call physician and  request and order for the lab tests needed.    Signed by: Candida Peeling, RN 11/24/2015 9:20:00 PM

## 2015-11-24 NOTE — Progress Notes (Signed)
[facility] Physiatry Attending Face to Face Progress Note   Functional Status/Update:   I reviewed patient's therapy notes to assess functional status and ongoing need for therapies. Of note, ADLs, Independent to S for most activites. Good carry-over with precautions.    Subjective:   Feels well and is without complaints. No headache. Sleeping well. Pain is controlled with current meds. No constipation. No chest pain. No shortness of breath.     Objective:   Filed Vitals:    11/23/15 1702 11/23/15 2015 11/24/15 0506 11/24/15 0552   BP: 102/56 95/50 129/63 117/58   Pulse: 90 86 86 76   Temp: 97.9 F (36.6 C) 99 F (37.2 C) 98.2 F (36.8 C) 97.7 F (36.5 C)   TempSrc: Oral Oral  Oral   Resp: 16 20 20 18    Height:       Weight:       SpO2: 98% 95% 97% 98%       Physical Examination:   Appears well.  In no acute distress. Normal body habitus. Appears stated age.   Conjunctivae non-injected. EOMI.   Symmetric facies. Moist mucous membranes.   +S1S2 Heart rate and rhythm are regular. No significant lower limb edema.   Lungs are clear to auscultation bilaterally. Good respiratory effort.   Soft. Non-tender.   Alert Pleasant and cooperative Oriented x 3  MSK: Rknee NTTP at time of exam. Not swollen. Neg ballotment     New Labs:  Results     ** No results found for the last 24 hours. **          Current medications:   Scheduled Meds:  Current Facility-Administered Medications   Medication Dose Route Frequency   . amLODIPine  2.5 mg Oral Daily   . atorvastatin  10 mg Oral QHS   . docusate sodium  100 mg Oral BID   . hydrocortisone   Topical BID   . lidocaine  1 patch Transdermal Q24H   . lisinopril  5 mg Oral Daily   . morphine  15 mg Oral Q8H   . multivitamin  1 tablet Oral Daily   . pantoprazole  40 mg Oral Daily   . pramipexole  0.25 mg Oral Q12H     PRN Meds:.ALPRAZolam, cyclobenzaprine, dextromethorphan-guaiFENesin, HYDROmorphone, HYDROmorphone, NON-FORMULARY PAT OWN MED order form, zolpidem    Assessment: 74 y.o. male with  History of lumbar laminectomy for spinal cord decompression    Plan:   REHAB: Continue comprehensive and intensive inpatient rehab program, including:   Physical therapy 60-120 min daily, 5-6 times per week, Occupational therapy 60-120 min daily, 5-6 times per week, Case management and Rehabilitation nursing    Will continue to address the following impairments and issues:  Mobility, ADLs and Adherence to precautions    S/p L4-S1 Laminectomy and fusion. Spinal precautions.  -pain controlled on current regimen  -dry dressings to wound.    Aseptic inflammation of knee:   -Lidoderm patch q12h  -flexeril, per patient use at home for knee spasm and buckling  -will f/u as OP for eval for injection    Insomina:   Home suvorexant (Belsomra) 20mg  QHS prn  -discontinue ambien.    HTN: Home amlodipine and lisinopril. Assess daily. Good blood pressure control thus far  I have discussed the plan of care with the resident physician and examined patient independently.  I have reviewed the note above and concur with findings and plan.  Additional comments are:  Doing well in tx.  Wound  healing well.  Discussed with the pt re: status and tx. Tentative [place] tomorrow.

## 2015-11-24 NOTE — Rehab Discharge Summary ([facility]) (Signed)
[name]  MRN: [id number]  Account: [id number]  Session Start: 11/24/2015 2:00:00 PM  Session Stop: 11/24/2015 3:00:00 PM    Physical Therapy  Inpatient Rehabilitation Discharge Summary and Note    Rehab Diagnosis: Degenerative lumbar spinal stenosis, Spondylolisthesis of  lumbar region, s/p fusion L4-S1  Demographics:            Age: 74Y            Gender: Male  Primary Language: English  Past Medical History: Hypertension controlled  Syncopal episodes  approx 2012  1x episode - had negative cardiac work-up - no episodes sinice  BPH (benign prostatic hypertrophy)  2007  w/ hx of obstruction - s/p suprapubic prostatectomy  History of pancreatitis  2007  had microscopic stones - ERCP w/ stent done  Spinal stenosis  Spondylolisthesis  Fibromyalgia  in remission  Gastroesophageal reflux disease  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 male admitted to [facility] 11/10/15 with hx chronic  pancreatitis, BPH, HTN who had chest pain before lumbar laminectomy and fusion  on 12/1 by Dr. Marjo Bicker. He had an EKG during the episode that showed no ST  elevations or LBBB. He proceeded with surgery. He states the pain was sharp,  left lower chest / left upper quadrant, and lasted /R/2 minutes. He denies chest  pain, shortness of breath, but notes acid reflux. These symptoms are sudden  onset, moderate intensity, without alleviating factors.  Surgery 12/1:  Lumbosacral fusion  Chest Pain- Etiology unclear however resolved without intervention after 2  minutes and has not recurred   EKG without acute ischemic changes, troponin negative X 1, will trend to rule  out ACS  Does note history of acid reflux which may have contributed to chest pain  v  atypical in etiology   CXR does not show infiltrate or other acute pathology, radiology read pending  No recurrence of chest pain after resolution.   Denies shortness of breath,  abdominal pain, nausea, vomiting, or reflux at this time.  States he is doing  well now.  Plan of care discussed with family at bedside.  12/3- Nausea/Vomiting- Likely due to recent surgery, acid reflux,  Restarted on  home PPI  12/4- Abdominal x-rays show ileus, no evidence of obstruction, will obtain  follow up per radiology recommendations, LFTs and Lipase without significant  acute abnormality, Already ruled out for ACS as above  11/14/15- Follow up abdominal x-rays shows mildly improved since the prior study  favoring a resolving ileus. Clear liquid diet.  Doppler to rule out DVT RLE for  R knee pain 10/10.  RESULTS:  No evidence of deep venous thrombosis involving  the right  lower extremity.  Right Knee effusion with limited range of motion, fevers, and  leukocytosis, Orthopedics performed joint aspiration 12/6: negative for  crystals, WBC 20K, Fluid Cx NGTD,  Knee x-rays show significant effusion  possibly due to inflammatory arthropathy, Surgical site appears C/D/I  Blood cx obtained, UA shows hematuria (possibly due to traumatic cath) and not  consistent with UTI, CXR shows developing right basal infiltrate however patient  without clinical  signs/symptoms of PNA (no cough, sputum production, shortness  of breath)  12/7-Patient complaining of worsening neck pain.  On eval by PT patient with  significant difficulty with passive ROM of Cervical neck.  Mild TTP along spiny  process.  MRI c spine pending  11/17/15-Neck Pain: Hx of Cervical fusion. MRI c spine 12/8 no acute findings.  Will continue symptomatic treatment.  Afebrile overnight.  12/9-Pt participating in therapy and will benefit from acute rehab @ [facility].   Pt  afebrile, medically stable to transfer today.   Date of Onset: 11/10/15   Date of Admission: 11/18/2015 2:21:00  PM  Rehabilitation Precautions/Restrictions:   Fall, skin and pressure ulcer, spinal precaution    SUBJECTIVE  Patient Report: Pt reports severe back pain an very tender to touch to skin R of  incision. MD made aware  Pain: Patient currently has pain. Pt reports 8/10 pain prior to medication,  reports 4/10 pain after lying down and allowing pain medication to work    OBJECTIVE  Bed Mobility: Pt requiers sup for all bed mobility    Refer to Functional Measures for remainder of functional mobility status.    Functional Measures      TRANSFERS BED/CHAIR/WHEELCHAIR: Bed/chair/wheelchair Transfer Score = 5.  Patient is supervision/set-up for transferring to and from the  bed/chair/wheelchair, requiring: Stand by assistance.  Verbal cuing, prompting, or instructing.  Patient requires the following assistive device(s): Walker.  Arm rest.    LOCOMOTION WHEELCHAIR:   Wheelchair did not occur because activity was contraindicated for patient.    LOCOMOTION WALK:   Walk Distance Scale = 3.  Distance walked is greater than 150 feet. Walk Score  = 5.  Patient requires supervision or set up for walking. Patient walked a  distance of  200 feet. Rolling walker. Pt is also able to walk short distances  with SBQC and sup    LOCOMOTION STAIRS: Stairs Score = 5.  Patient requires supervision or set up for  negotiating stairs. 20 stairs.  Patient requires the following assistive device(s):  Handrail(s).    CARE Tool Discharge Performance  Mobility:   Roll Left and Right: Patient completes the activities by him/herself with no  assistance from a helper.   Sit to Lying: Helper provides verbal cues or touching/steadying assistance as  patient completes activity.   Lying to Sitting on Side of Bed: Helper provides verbal cues or  touching/steadying assistance as patient completes activity.   Sit to Stand: Helper provides verbal cues or touching/steadying assistance as  patient completes activity.   Chair/Bed to Chair Transfer: Helper provides  verbal cues or touching/steadying  assistance as patient completes activity.   Car Transfer: Helper provides verbal cues or touching/steadying assistance as  patient completes activity.    Patient Walks:  Yes   Walk 10 Feet: Helper provides verbal cues or touching/steadying assistance as  patient completes activity.   Walk 50 Feet With 2 Turns: Helper provides verbal cues or touching/steadying  assistance as patient completes activity.   Walk 150 Feet: Helper provides verbal cues or touching/steadying assistance as  patient completes activity.   Walking 10 Feet on Uneven Surfaces: Helper provides verbal cues or  touching/steadying assistance as patient completes activity.   1 Step Over Curb or Up/Down Stair: Helper provides verbal cues or  touching/steadying assistance as patient completes activity.   4 Steps Up and Down, With/Without Rail: Helper provides verbal cues or  touching/steadying assistance as patient completes activity.   12 Steps Up and  Down, With/Without Rail: Helper provides verbal cues or  touching/steadying assistance as patient completes activity.   Picking up an Object: Not attempted due to medical or safety concerns.     Uses Wheelchair/Scooter: No    Today's Treatment Interventions:       Therapeutic Activities:  Pt tx foused on education adn HEP for pain  management including medication and changing positions, avoid twisting when  reclined in bed using computer, walking every hour, stretching. Pt able to  verbalize understanding of edcuation provided. Pt performed the above listed  functional mobility tasks and abel to demo safety with all, sup for knee  buckling. Pt returned to room supine in bed and call bell in reach    Education Provided:    Education Provided: Precautions. Plan of care. Safety issues and interventions.  Use of adaptive devices. Supervision requirements. Activities of daily living.  Functional transfers. Bed mobility. Safety. Equipment. Gait. Home  exercise/activity plan.  Stair/curb/environmental barrier negotiation.       Audience: Patient.       Mode: Explanation.  Demonstration.  Teacher, English as a foreign language provided.       Response: Applied knowledge.  Verbalized understanding.  Demonstrated skill.  Needs reinforcement.    Equipment Provided/Ordered: None at this time.    ASSESSMENT  Summary of Progress and Current Status: Mr. Mccartin presents to physical therapy  with impaired muscle tone and weakness, impaired muscle activation and proper  coordination throughout trunk and poor balance due to history of occasional R  knee buckling. He has progressed well in therapy and will have the assistance of  his daughter upon d/c. Mr. Hunsinger will benefit from ongoign skilled PT in order  to continue to address these impairments so he may return to his own home and to  his role as an active member of the community    Previously Documented Mode of Locomotion at Discharge: Walk  PAI Expected Mode of Locomotion at Discharge: No change to the current  documented expected, most frequently used mode of locomotion at discharge    Progress Toward Goals (final status):   LONG TERM GOAL REVIEW:       1. Pt will peform supine to/from sit on flat bed with mod I using external  support if needed for increased safety and independence upon discharge. - Met       2. Pt will perform transfers mod I with most approrpriate assistive device  for increased safety and independence upon discharge. - Not Met Pt requires sup  for transfers for balance safety       3. Pt will perform ambulation over level surfaces with most appropriate  assistive device x 150 feet mod I for increased safety and independence upon  discharge. - Not Met Pt requires sup for balance safety       4. Pt will ascend/descend a flight of stairs mod I with one hand rail to  access bedroom level of his daughter's home upon discharge. - Not Met: Pt  requires supfor balance safety       5. Pt and his family will particiapte in needed education and  training  sessions to maximize pt safety and independence upon discharge.  - Not Met: not  required    PLAN  Physical Therapy Plan: Patient is recommended for outpatient therapy services.    Team Care Plan  Please review Integrated Patient View Care Plan Flowsheet for Team identified  Problems, Interventions, and Goals.    Identified problems from team documentation:  Problem: Impaired Mobility  Mobility: Primary Team Goal: Pt will perform majority of mobility at a mod I  level upon discharge with most appropriate assisstive device./Active    Problem: Impaired Pain Management  Pain Mgmt: Primary Team Goal: Patient's back discomfort will not impact  functional tasks 100% of the time/Active    Problem: Impaired Self-care Mgmt/ADL/IADL  Self Care: Primary Team Goal: Pt will be mod I for ADL and fxl transfer with  LRD/Not Met    Problem: Impaired Skin/Wound Mgmt  Skin Wound: Primary Team Goal: Patient and caregiver will be able perrform demo  on back mgt and incisional care without diff. prior to discharge/Active    Status update for discharge:     Mobility:   Primary Goal: Not Met  Discharge Status Comment:   Pt requires sup for mobility due to fluctuations in  pain    3 Hour Rule Minutes: 60 minutes of PT treatment this session count towards  intensity and duration of therapy requirement. Patient was seen for the full  scheduled time of PT treatment this session.  Therapy Mode Minutes: Individual: 60 minutes.    Signed by: Delsa Grana, PT, DPT, NCS 11/24/2015 3:00:00 PM

## 2015-11-25 MED ORDER — MORPHINE SULFATE ER 15 MG PO TBCR
15.0000 mg | EXTENDED_RELEASE_TABLET | Freq: Three times a day (TID) | ORAL | Status: DC
Start: 2015-11-25 — End: 2015-12-04

## 2015-11-25 MED ORDER — PANTOPRAZOLE SODIUM 40 MG PO TBEC
40.0000 mg | DELAYED_RELEASE_TABLET | Freq: Every day | ORAL | Status: AC
Start: 2015-11-25 — End: 2015-12-25

## 2015-11-25 MED ORDER — ATORVASTATIN CALCIUM 10 MG PO TABS
10.0000 mg | ORAL_TABLET | Freq: Every evening | ORAL | Status: AC
Start: 2015-11-25 — End: 2015-12-25

## 2015-11-25 MED ORDER — CYCLOBENZAPRINE HCL 5 MG PO TABS
5.0000 mg | ORAL_TABLET | Freq: Three times a day (TID) | ORAL | Status: DC | PRN
Start: 2015-11-25 — End: 2018-05-06

## 2015-11-25 MED ORDER — ALPRAZOLAM 0.5 MG PO TABS
0.5000 mg | ORAL_TABLET | Freq: Two times a day (BID) | ORAL | Status: DC | PRN
Start: 2015-11-25 — End: 2015-12-04

## 2015-11-25 MED ORDER — HYDROMORPHONE HCL 2 MG PO TABS
2.0000 mg | ORAL_TABLET | ORAL | Status: DC | PRN
Start: 2015-11-25 — End: 2015-12-04

## 2015-11-25 MED ORDER — PRAMIPEXOLE DIHYDROCHLORIDE 0.25 MG PO TABS
0.2500 mg | ORAL_TABLET | Freq: Two times a day (BID) | ORAL | Status: AC
Start: 2015-11-25 — End: 2015-12-25

## 2015-11-25 MED ORDER — LISINOPRIL 5 MG PO TABS
5.0000 mg | ORAL_TABLET | Freq: Every day | ORAL | Status: DC
Start: 2015-11-25 — End: 2015-12-03

## 2015-11-25 NOTE — Rehab Discharge Summary ([facility]) (Signed)
NAMENOVAK STGERMAINE  MRN: [id number]  Account: [id number]  Session Start: 11/25/2015 12:00:00 AM  Session Stop: 11/25/2015 12:00:00 AM    Therapeutic Recreation  Inpatient Rehabilitation Discharge Summary    Rehab Diagnosis: Degenerative lumbar spinal stenosis, Spondylolisthesis of  lumbar region, s/p fusion L4-S1  Demographics:            Age: 74Y            Gender: Male    Past Medical History: Hypertension controlled  Syncopal episodes  approx 2012  1x episode - had negative cardiac work-up - no episodes sinice  BPH (benign prostatic hypertrophy)  2007  w/ hx of obstruction - s/p suprapubic prostatectomy  History of pancreatitis  2007  had microscopic stones - ERCP w/ stent done  Spinal stenosis  Spondylolisthesis  Fibromyalgia  in remission  Gastroesophageal reflux disease  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 male admitted to [facility] 11/10/15 with hx chronic  pancreatitis, BPH, HTN who had chest pain before lumbar laminectomy and fusion  on 12/1 by Dr. Marjo Bicker. He had an EKG during the episode that showed no ST  elevations or LBBB. He proceeded with surgery. He states the pain was sharp,  left lower chest / left upper quadrant, and lasted /R/2 minutes. He denies chest  pain, shortness of breath, but notes acid reflux. These symptoms are sudden  onset, moderate intensity, without alleviating factors.  Surgery 12/1:  Lumbosacral fusion  Chest Pain- Etiology unclear however resolved without intervention after 2  minutes and has not recurred   EKG without acute ischemic changes, troponin negative X 1, will trend to rule  out ACS  Does note history of acid reflux which may have contributed to chest pain v  atypical in etiology    CXR does not show infiltrate or other acute pathology, radiology read pending  No recurrence of chest pain after resolution.   Denies shortness of breath,  abdominal pain, nausea, vomiting, or reflux at this time.  States he is doing  well now.  Plan of care discussed with family at bedside.  12/3- Nausea/Vomiting- Likely due to recent surgery, acid reflux,  Restarted on  home PPI  12/4- Abdominal x-rays show ileus, no evidence of obstruction, will obtain  follow up per radiology recommendations, LFTs and Lipase without significant  acute abnormality, Already ruled out for ACS as above  11/14/15- Follow up abdominal x-rays shows mildly improved since the prior study  favoring a resolving ileus. Clear liquid diet.  Doppler to rule out DVT RLE for  R knee pain 10/10.  RESULTS:  No evidence of deep venous thrombosis involving  the right  lower extremity.  Right Knee effusion with limited range of motion, fevers, and  leukocytosis, Orthopedics performed joint aspiration 12/6: negative for  crystals, WBC 20K, Fluid Cx NGTD,  Knee x-rays show significant effusion  possibly due to inflammatory arthropathy, Surgical site appears C/D/I  Blood cx obtained, UA shows hematuria (possibly due to traumatic cath) and not  consistent with UTI, CXR shows developing right basal infiltrate however patient  without clinical signs/symptoms of PNA (no  cough, sputum production, shortness  of breath)  12/7-Patient complaining of worsening neck pain.  On eval by PT patient with  significant difficulty with passive ROM of Cervical neck.  Mild TTP along spiny  process.  MRI c spine pending  11/17/15-Neck Pain: Hx of Cervical fusion. MRI c spine 12/8 no acute findings.  Will continue symptomatic treatment.  Afebrile overnight.  12/9-Pt participating in therapy and will benefit from acute rehab @ [facility].   Pt  afebrile, medically stable to transfer today.            Date of Onset:  11/10/15            Date of Admission: 11/18/2015 2:21:00  PM    Rehabilitation Precautions/Restrictions:   Fall, skin and pressure ulcer, spinal precaution    OBJECTIVE  Leisure Participation:  Modified Independent for participation in leisure  activities.  Equipment Recommended: No leisure equipment was recommended at discharge.  Community Mobility: Patient Not assessed.    Functional Measures      SOCIAL INTERACTION: Social Interaction Score = 7, Independent. Patient is  completely independent for social interaction.  There are no activity  limitations.      ASSESSMENT  Summary of Deficits and Recommended Follow-up: Pt is at a mod I level for  leisure activties. Pt did not participate in structured TR sessions due to short  length of stay. Pt able to structure and initiate leisure time. Enjoyed pet  therapy visits. Prepared for d/c 11/25/15.  Progress Toward Goals:   LONG TERM GOAL REVIEW:       1. Pt will participate in a leisure activity of choice with mod I upon  discharge. - Met       2. Pt will improve strength and endurance as demonstrated through the  ability to perform and tolerate various activities upon discharge. - Met  Time frame to achieve above long term goal(s):  2 weeks      PLAN  Therapeutic Recreation services are not recommended at this time due to: No need  for skilled therapy intervention at this time.    Care Plan  Identified problems from team documentation:  Problem: Impaired Mobility  Mobility: Primary Team Goal: Pt will perform majority of mobility at a mod I  level upon discharge with most appropriate assisstive device./Met    Problem: Impaired Pain Management  Pain Mgmt: Primary Team Goal: Patient's back discomfort will not impact  functional tasks 100% of the time/Met    Problem: Impaired Self-care Mgmt/ADL/IADL  Self Care: Primary Team Goal: Pt will be mod I for ADL and fxl transfer with  LRD/Met    Problem: Impaired Skin/Wound Mgmt  Skin Wound: Primary Team Goal: Patient and caregiver will be able perrform demo  on back mgt and incisional  care without diff. prior to discharge/Active    Status update for discharge:      Please review Integrated Patient View Care Plan Flowsheet for Team identified  Problems, Interventions, and Goals.    Signed by: Pearson Forster, CTRS 11/25/2015 4:19:00 PM

## 2015-11-25 NOTE — Discharge Instr - Diet (Signed)
Resume regular diet

## 2015-11-25 NOTE — Discharge Summary (Signed)
IRF DISCHARGE SUMMARY    Date of admission: 11/18/2015    Date of discharge:  11/25/2015    Attending physician:  Candie Echevaria, MD    Admitting diagnosis:  Dysfunction of mobility and ADL's secondary to lumbar stenosis s/p decompression and fusion.    HPI:  This 74 y.o. year old RHD male was admitted to [facility] on 11-10-15 with lumbar stenosis and BLE neurogenic claudication. He underwent L4-S1 laminectomy and fusion. Post op course has been complicated by ileus and vomiting. He also had severe right knee pain and aseptic inflammation.    Hospital Course:  Pt met all therapy goals and was d/c'ed at Mod I level for mobility and ADL's.  Pain management and wound healing was successful.      Discharge location: Home without assistance      Discharge instructions:  Refrain from driving pending further evaluation by physician and/or OT.  Refrain from drinking alcohol pending further evaluation by physician.  Continue use of orthosis until seen in follow-up by your surgical consultant.  Follow-up with primary care physician within 1 weeks.  Follow-up with spine surgeon on 11-29-2015    Rehabilitation services required:  Home physical therapy for impaired mobility.  Home occupational therapy for impaired self-care skills.  I have had a face to face visit with patient on 11-29-15 and have determined there is need for home health services as the patient is unable to leave home without assistance at this time.      Rehabilitation equipment required:  Rolling walker for impaired mobility  I have had a face to face visit with patient on 11-29-15 and have determined that this equipment is medically necessary to enable safe functioning within the home environment.    Discharge medications:      Matthew, [name]   Home Medication Instructions [id number]    Printed on:11/25/15 1410   Medication Information                      ALPRAZolam (XANAX) 0.5 MG tablet  Take 1 tablet (0.5 mg total) by mouth 2 (two)  times daily as needed for Anxiety.             atorvastatin (LIPITOR) 10 MG tablet  Take 1 tablet (10 mg total) by mouth nightly.             BELSOMRA 20 MG Tab  Take 20 mg by mouth nightly.                cyclobenzaprine (FLEXERIL) 5 MG tablet  Take 1 tablet (5 mg total) by mouth 3 (three) times daily as needed for Muscle spasms.             HYDROmorphone (DILAUDID) 2 MG tablet  Take 1 tablet (2 mg total) by mouth every 4 (four) hours as needed.             lisinopril (PRINIVIL,ZESTRIL) 5 MG tablet  Take 1 tablet (5 mg total) by mouth daily.             morphine (MS CONTIN) 15 MG Tab CR 12 hr tablet  Take 1 tablet (15 mg total) by mouth every 8 (eight) hours.             pantoprazole (PROTONIX) 40 MG tablet  Take 1 tablet (40 mg total) by mouth daily.             pramipexole (MIRAPEX) 0.25 MG tablet  Take 1 tablet (  0.25 mg total) by mouth every 12 (twelve) hours.                 Recent labs/imaging  Lab Results   Component Value Date    WBC 7.86 11/19/2015    HGB 12.6* 11/19/2015    HCT 37.2* 11/19/2015    MCV 93.0 11/19/2015    PLT 350 11/19/2015     Lab Results   Component Value Date    BUN 12 11/19/2015     Lab Results   Component Value Date    CREAT 0.8 11/19/2015     Lab Results   Component Value Date    NA 139 11/19/2015    K 3.9 11/19/2015    CL 102 11/19/2015    CO2 30* 11/19/2015     Lab Results   Component Value Date    ALT 25 11/19/2015    AST 12 11/19/2015    ALKPHOS 141* 11/19/2015    BILITOTAL 1.0 11/19/2015     No results found for: INR, PT    US Venous Low Extrem Duplx Dopp Comp Bilat    11/18/2015   No deep venous thrombosis of the BILATERAL lower extremities. Jonette Pesa, MD 11/18/2015 7:37 PM     Physical exam on the day of d/c:  General appearance: Normal body habitus.  Eyes:Anicteric sclerae. Conjunctivae non-injected. EOMI.  HENT:Hearing grossly intact. Moist mucous membranes.  CV:+S1S2 Heart rate and rhythm are regular. No significant lower limb edema.  Pulm:Lungs are clear to auscultation  bilaterally. No wheezes, rales, or rhonchi. Good respiratory effort.  ZOX:WRUE. Non-tender. Normoactive bowel sounds.  Skin:No visible rashes or breakdown. Incision is c/d/i.  Neuro: Speech fluent and appropriate.    ROS on the day of d/c:  No n/v/d/c/cp/sob/cough/HA/fever/chills/dysuria    Discharge diagnoses:  1. Lumbar stenosis with neurogenic claudication  2. S/P L4-S1 decomp and fusion     Discussed with the pt and his daughter at length re: status, prognosis and d/c plans.  Questions were answered.  T:35 min    CC: Pcp, [facility], MD (None)

## 2015-11-25 NOTE — Rehab Discharge Summary ([facility]) (Signed)
Matthew [name]  MRN: [id number]  Account: [id number]  Session Start: 11/25/2015 12:00:00 AM  Session Stop: 11/25/2015 12:00:00 AM    Rehabilitation Nursing  Inpatient Rehabilitation Discharge Summary    Rehab Diagnosis: Degenerative lumbar spinal stenosis, Spondylolisthesis of  lumbar region, s/p fusion L4-S1  Demographics:            Age: 74Y            Gender: Male  Primary Language: English    Date of Onset:  11/10/15  Date of Admission: 11/18/2015 2:21:00 PM    Rehabilitation Precautions Restrictions:   Fall, skin and pressure ulcer, spinal precaution    Discharge:  Patient discharged to:   Home  At discharge, the patient was discharged to live (with):  Family / Relatives.  Follow up providers include:    VITAL SIGNS  Blood Pressure: 94/57 mmHg  Temperature:  degrees  Pulse: 97 beats per minute  Respirations: 20 breaths per minute  Pain: 4/10    WEIGHT and NUTRITION  Admission Weight: 158.5 pounds; Current Weight: 158.5pounds  Weight Change since Admit: Patient has had no weight change since admission.  Food Consistency: Regular  Liquid Consistency:Thin    Patient Report: : " I feel okay"  Pain: Patient currently has pain.  Location: Lower Back  Type: Acute  Quality: Aching.  Pain Scale: Numeric.  Patient reports a pain level of 5 out of 10.  Patient's acceptable level of pain 2 out of 10.   Interferes with physical activity. sleep.  Pain is alleviated by: Pain medication and repositioning.  Pain is exacerbated by: movement Patient medicated. Repositioned patient.  Pain Reassessment:  Response to Pain Intervention: Patient verbalized acceptable pain relief.  Post Intervention Pain Quality:  None.  Patient Reports Post Intervention Pain Level of: 0 out of 10  Pain Acceptable: Yes  Patient/Caregiver Goals:  To be able to walk alone with out pain    SEVERE SEPSIS SCREEN  INFECTION:  Patient has no indication of infection.  Negative Sepsis Screen.  If you are unable to assess a system's dysfunction because you do  not have labs,  or the labs you have are not current (within 24 hours), call physician and  request and order for the lab tests needed.    NEURO  Orientation/Awareness: Alert and Oriented x3.  Speech: No deficits noted at this time.  Behavior: Cooperative.    MEDICATIONS  IV Access: No IV access.  Dialysis Access: Patient does not have dialysis access.    Elopement Risk Level Assessment Tool  Patient Criteria: Patient is not capable of leaving the unit.  Assessment is not  applicable.    RISK ASSESSMENT FOR FALLS/INJURY    MENTAL STATUS CRITERIA:   0 - None identified.  MENTAL STATUS TOTAL: 0    AGE CRITERIA:   39 - 65-73 years old  AGE TOTAL: 1    ELIMINATION CRITERIA:   3 - Toileting with Assistance.   ELIMINATION TOTAL: 3    HISTORY OF FALLS CRITERIA:   2 - Unknown History.  HISTORY OF FALLS TOTAL: 2    MEDICATIONS CRITERIA:   2 or more High Risk Medications (*see list below)   MEDICATIONS TOTAL: 2    PHYSICAL MOBILITY CRITERIA:   3 - Decreased balance reaction.   1 - Weakness/impaired physical mobility.   PHYSICAL MOBILITY TOTAL: 4    FALLS RISK ASSESSMENT TOTAL: 12    Patient's Fall Risk: No Risk Level found for this score.  Falls Interventions: Clutter removed and clear path to BR.  Call bell, phone, glasses, etc within reach.  Yellow "high risk" patient identification in place: wrist band, socks, chart  sticker, door sign.  Pt and family education.  Low bed with mat    NUTRITION  Diet:  Type: Regular.  Food Consistency: Regular.  Liquid Consistency: Thin.    CARDIOVASCULAR     Bilateral lower extremities  Nail Bed Color: Pink.   No edema or redness present.  Pulses:   Apical Pulse: Regular. Strong. Rate is 97 .   Patient does not have a pacemaker.   Patient does not have a defibrillator.    CARDIOPULMONARY  Lung Sounds:   Upper lobes. Clear.   Lower lobes. Clear.  Type of Respirations: Regular.  Cough: No cough noted.  Respiratory Support: The patient does not require any respiratory support.  Respiratory  Equipment: None.    INTEGUMENTARY  Skin:  Temperature: Warm  Turgor: Normal for age  Moisture: Dry  Color of skin: Normal for Race/Ethnicity  Capillary Refill: Less than 3 seconds     Red Rash on back is clearing. Hydrocortisone Cream applied as ordered.  Wounds/Incisions:     Surgical Incision: Back incision. Length: 12 centimeter(s) with glue. No signs  of infection.  Drainage: Incision without drainage.  Odor:  No  Incision Care: Incision healing  Braden Scale for Predicting Pressure Sore Risk: Sensory Perception: No  impairment  Moisture: Rarely moist  Activity: Walks occasionally  Mobility: Slightly limited  Nutrition: Adequate  Friction and Shear: No apparent problem  Braden Score: 20  Level of Risk: No risk (19-23). Will reassess every shift.    GASTROINTESTINAL  Abdomen: Soft. Nontender.  Bowel Sounds:  Active bowel sounds audible in all four quadrants.  Date of Last Bowel Movement:  11/24/15  Current Bowel Pattern:  Continent   No Problems/Complaints with Bowel Elimination Assessed.    GENITOURINARY  Current Bladder Pattern: Continent  Color:  Yellow   Patient denies problems with urination and/or catheter.    MUSCULOSKELETAL  Upper Body: WNL  Lower Body: BLE weakness, RLE limited mobility due to knee pain    Functional Measures      TOILETING: Toileting Score = 7.  Patient is completely independent for  toileting. There are no activity limitations.    BLADDER MANAGEMENT - LEVEL OF ASSIST: Bladder Score = 7. Patient is completely  independent for bladder management. There are no activity limitations.    BLADDER ACCIDENTS THIS SHIFT:  0 . Patient has not had an accident this  assessment and did not require medications or devices.    BOWEL MANAGEMENT - LEVEL OF ASSIST: Bowel Score = 6.  Patient is modified  independent for bowel management.  Patient did not have bowel movement.  Medication/intervention was provided.    BOWEL ACCIDENTS THIS SHIFT: 0 . Patient has not had an accident, but used a  stool  softener.    COMPREHENSION: Both ( auditory and visual) modes of comprehension are used  equally. Comprehension Score = 7, Independent.  Patient comprehends  complex/abstract information in their primary language.  Patient is completely  independent for auditory and visual comprehension.  There are no activity  limitations.    EXPRESSION: Both ( vocal and non-vocal) modes of expression are used equally.  Expression Score = 7, Independent. Patient expresses complex/abstract  information in their primary language.  Patient is completely independent for  vocal and non-vocal expression.  There are no activity limitations.  SOCIAL INTERACTION: Social Interaction Score = 7, Independent. Patient is  completely independent for social interaction.  There are no activity  limitations.    PROBLEM SOLVING: Problem Solving Score = 7, Independent.  Patient makes  appropriate decisions in order to solve complex problems.  Patient is completely  independent for problem solving.  There are no activity limitations.    MEMORY: Memory Score = 7, Independent.  Patient is completely independent for  memory.  There are no activity limitations.    Education Provided:    Education Provided: Pain management. Pain scale. Medication options. Side  effects. Safety issues and interventions. Fall protocol. Skin/wound care.  Signs/symptoms of infection. Incision care. Prevention of skin breakdown.  Medication. Name and dosage. Administration. Purpose. Side Effects.       Audience: Patient.       Mode: Explanation.       Response: Verbalized understanding.    ASSESSMENT  Long Term Goals (status prior to discharge): 1. Patient will call for help 100%  of time before getting OOB inorder to avoid fall during his recovery time   2. Patient will verbalize pain 2/10 before and during therapy with the help of  current pain medication   3. Patient's surgica incision will heal with no complication   4. Patient will identify at least 2 sideffects of his  medications inorder to  get prompt intervention  2 weeks from 11/18/15  Short Term Goals (status prior to discharge): 1. Patient will call for help 100%  of time before getting OOB inorder to avoid fall during his recovery time - Goal  Met   2. Patient will verbalize pain 2/10 before and during therapy with the help of  current pain medication - Goal Not Met   3. Patient will identify at least 2 sideffects of 75% his medications inorder  to get prompt intervention - Goal Not Met  1 week from 11/18/15    Progress Towards Goals (final status): SHORT TERM GOAL REVIEW:       1. Patient will call for help 100% of time before getting OOB inorder to  avoid fall during his recovery time - Met       2. Patient will verbalize pain 2/10 before and during therapy with the help  of current pain medication - Met       3. Patient will identify at least 2 sideffects of 75% his medications  inorder to get prompt intervention - Met   LONG TERM GOAL REVIEW:       1. Patient will call for help 100% of time before getting OOB inorder to  avoid fall during his recovery time - Met       2. Patient will verbalize pain 2/10 before and during therapy with the help  of current pain medication - Met       3. Patient's surgica incision will heal with no complication - Met       4. Patient will identify at least 2 sideffects of his medications inorder  to get prompt intervention - Met    PLAN  Recommendations for Follow-Up Care:   Yes, patient received valuables.  Bladder Program: Patient is continent bladder and bowel  Bowel Program:  Last Bowel Movement- 11/24/15    Skin: Patient / Caregiver will do incision dressing change as ordered by MD.  Current Diet: Regular  Pain Management: Medication. Patient will take pain medication as ordered by MD.      Care Plan  Identified  problems from team documentation:  Problem: Impaired Mobility  Mobility: Primary Team Goal: Pt will perform majority of mobility at a mod I  level upon discharge with most  appropriate assisstive device./Not Met    Problem: Impaired Pain Management  Pain Mgmt: Primary Team Goal: Patient's back discomfort will not impact  functional tasks 100% of the time/Active    Problem: Impaired Self-care Mgmt/ADL/IADL  Self Care: Primary Team Goal: Pt will be mod I for ADL and fxl transfer with  LRD/Not Met    Problem: Impaired Skin/Wound Mgmt  Skin Wound: Primary Team Goal: Patient and caregiver will be able perrform demo  on back mgt and incisional care without diff. prior to discharge/Active    Status update for discharge:     Mobility:   Primary Goal: Met  Discharge Status Comment:   Patient is mod I with mobility.     Pain Management:   Primary Goal: Met  Discharge Status Comment:   Patient 's pain  goes down from 5/10 to 0/10 after  scheduled and PRN pain medications are given.     Self Care Management:   Primary Goal: Met  Discharge Status Comment:   Patient is independent with self-care activities.    Please review Integrated Patient View Care Plan Flowsheet for Team identified  Problems, Interventions, and Goals.    Signed by: Candida Peeling, RN 11/25/2015 1:00:00 AM

## 2015-11-25 NOTE — Rehab Discharge Instruction ([facility]) (Signed)
[name]  MRN: [id number]  Account: [id number]  Session Start: 11/25/2015 12:00:00 AM  Session Stop: 11/25/2015 12:00:00 AM    Case Management  Inpatient Rehabilitation Discharge Instructions    Discharge Date: 12//16/16  Transportation: Family  Discharge Plan: Outpatient Rehab  -please contact your local outpatient rehab centers /T/ provide medical  prescription from Dr. Ellen Henri to start services.    Follow-up Appointment(s): Please schedule apppointments to the medical providers  within a month below:    Orthopaedic Surgery Dr. Amaryllis Dyke  [address]  [place], [place] [address]  [phone]    Your primary care physician Maisie Fus Dr. Tawanna Sat    Renotification of Medicare Important Message: The Renotification of Medicare  Important Message letter was issued.    Additional Information: If you have any questions with regards to your rehab  discharge, please feel free to contact Case Manager Adela Lank  [phone].    Signed by: Adela Lank, MSW 11/25/2015 11:23:00 AM

## 2015-11-25 NOTE — Rehab Discharge Summary ([facility]) (Signed)
Matthew [name]  MRN: [id number]  Account: [id number]  Session Start: 11/25/2015 12:00:00 AM  Session Stop: 11/25/2015 12:00:00 AM    Case Management  Inpatient Rehabilitation Discharge Summary    Rehab Diagnosis: Degenerative lumbar spinal stenosis, Spondylolisthesis of  lumbar region, s/p fusion L4-S1  Demographics:            Age: 74Y            Gender: Male    Past Medical History: Hypertension controlled  Syncopal episodes  approx 2012  1x episode - had negative cardiac work-up - no episodes sinice  BPH (benign prostatic hypertrophy)  2007  w/ hx of obstruction - s/p suprapubic prostatectomy  History of pancreatitis  2007  had microscopic stones - ERCP w/ stent done  Spinal stenosis  Spondylolisthesis  Fibromyalgia  in remission  Gastroesophageal reflux disease  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 male admitted to [facility] 11/10/15 with hx chronic  pancreatitis, BPH, HTN who had chest pain before lumbar laminectomy and fusion  on 12/1 by Dr. Marjo [name]. He had an EKG during the episode that showed no ST  elevations or LBBB. He proceeded with surgery. He states the pain was sharp,  left lower chest / left upper quadrant, and lasted /R/2 minutes. He denies chest  pain, shortness of breath, but notes acid reflux. These symptoms are sudden  onset, moderate intensity, without alleviating factors.  Surgery 12/1:  Lumbosacral fusion  Chest Pain- Etiology unclear however resolved without intervention after 2  minutes and has not recurred   EKG without acute ischemic changes, troponin negative X 1, will trend to rule  out ACS  Does note history of acid reflux which may have contributed to chest pain v  atypical in etiology   CXR does  not show infiltrate or other acute pathology, radiology read pending  No recurrence of chest pain after resolution.   Denies shortness of breath,  abdominal pain, nausea, vomiting, or reflux at this time.  States he is doing  well now.  Plan of care discussed with family at bedside.  12/3- Nausea/Vomiting- Likely due to recent surgery, acid reflux,  Restarted on  home PPI  12/4- Abdominal x-rays show ileus, no evidence of obstruction, will obtain  follow up per radiology recommendations, LFTs and Lipase without significant  acute abnormality, Already ruled out for ACS as above  11/14/15- Follow up abdominal x-rays shows mildly improved since the prior study  favoring a resolving ileus. Clear liquid diet.  Doppler to rule out DVT RLE for  R knee pain 10/10.  RESULTS:  No evidence of deep venous thrombosis involving  the right  lower extremity.  Right Knee effusion with limited range of motion, fevers, and  leukocytosis, Orthopedics performed joint aspiration 12/6: negative for  crystals, WBC 20K, Fluid Cx NGTD,  Knee x-rays show significant effusion  possibly due to inflammatory arthropathy, Surgical site appears C/D/I  Blood cx obtained, UA shows hematuria (possibly due to traumatic cath) and not  consistent with UTI, CXR shows developing right basal infiltrate however patient  without clinical signs/symptoms of PNA (no  cough, sputum production, shortness  of breath)  12/7-Patient complaining of worsening neck pain.  On eval by PT patient with  significant difficulty with passive ROM of Cervical neck.  Mild TTP along spiny  process.  MRI c spine pending  11/17/15-Neck Pain: Hx of Cervical fusion. MRI c spine 12/8 no acute findings.  Will continue symptomatic treatment.  Afebrile overnight.  12/9-Pt participating in therapy and will benefit from acute rehab @ [facility].   Pt  afebrile, medically stable to transfer today.   Date of Onset:  11/10/15   Date of Admission: 11/18/2015 2:21:00 PM    Discharge Date:  12//16/16  Transportation: Family  Discharge Plan: Outpatient Rehab  -please contact your local outpatient rehab centers /T/ provide medical  prescription from Dr. Ellen [name] to start services.    Follow Up Appointment(s):  Please schedule apppointments to the medical  providers within a month below:    Orthopaedic Surgery Dr. Amaryllis [name]  [address]  [place], [place] [address]  [phone]    Your primary care physician Matthew Fus Dr. Tawanna [name]        Renotification of Medicare Important Message: The Renotification of Medicare  Important Message letter was issued.    Care Plan  Identified problems from team documentation:  Problem: Impaired Mobility  Mobility: Primary Team Goal: Pt will perform majority of mobility at a mod I  level upon discharge with most appropriate assisstive device./Met    Problem: Impaired Pain Management  Pain Mgmt: Primary Team Goal: Patient's back discomfort will not impact  functional tasks 100% of the time/Met    Problem: Impaired Self-care Mgmt/ADL/IADL  Self Care: Primary Team Goal: Pt will be mod I for ADL and fxl transfer with  LRD/Met    Problem: Impaired Skin/Wound Mgmt  Skin Wound: Primary Team Goal: Patient and caregiver will be able perrform demo  on back mgt and incisional care without diff. prior to discharge/Active    Status update for discharge:      Please review Integrated Patient View Care Plan Flowsheet for Team identified  Problems, Interventions, and Goals.    Signed by: Adela Lank, MSW 11/25/2015 11:22:00 AM

## 2015-11-25 NOTE — Rehab Progress Note ([facility]) (Signed)
[name]  MRN: [id number]  Account: [id number]  Session Start: 11/24/2015 1:00:00 PM  Session Stop: 11/24/2015 2:00:00 PM    Occupational Therapy  Inpatient Rehabilitation Progress Note - Brief    Rehab Diagnosis: Degenerative lumbar spinal stenosis, Spondylolisthesis of  lumbar region, s/p fusion L4-S1  Demographics:            Age: 72Y            Gender: Male  Rehabilitation Precautions/Restrictions:   Fall, skin and pressure ulcer, spinal precaution    SUBJECTIVE  Patient Report: "My back, it just hurts whenever I move. So I need to just sit"  Pain: Patient currently has pain.  Location: Low back  Type: Chronic  Quality: Aching.  Tender.  Pain Scale: Numeric.  Patient reports a pain level of 7 out of 10.  Patient's acceptable level of pain 0 out of 10.   Interferes with physical activity.  Pain is alleviated by: Warm compress to mid back (away from incision site)  Pain is exacerbated by: Movement Applied heat.    OBJECTIVE    Interventions:       Therapeutic Activities:  Tx focused on reviewing home safety and  identifying means for pt to manage pain. Completed gait level transfer from  toilet to bed with RW and SBA. When seated EOB, pt readjusted abdominal binder  and reported continued discomfort requiring significant encouragement to engage  in functional activity. Trialed moist heat to L and R trunk to avoid surgical  site but in effort to decrease generalized discomfort with pt reporting relief  but no change in pain score. Completed brief BUE and LE therex with light to  moderate resistance band and frequent rests. Concluded tx with gait level  mobility with RW to increase ability to safely access bed/bath at home with pt  then handed off to [facility] for pain medication.  Pain Reassessment:  Response to Pain Intervention: no change  Post Intervention Pain Quality:  Aching.  Patient Reports Post Intervention Pain Level of: 7 out of 10  Pain Acceptable: Yes    Education Provided:    Education  Provided: Activities of daily living. Pain management. Functional  transfers. Safety issues and interventions.       Audience: Patient.       Mode: Explanation.  Demonstration.       Response: Verbalized understanding.    ASSESSMENT  Matthew [name] appeared more limited by pain this session however, responded well  for methods pain management when medications are unavailable such as rest  breaks, heat, and repositioning.    Functional Measures      PLAN  Continued Occupational Therapy is recommended.  Recommended Frequency/Duration/Intensity: 5-6days/wk, 60-120 min/day, 10 -14  days  Continued Activities Contributing Toward Care Plan: balance, strengthening, ther  ax,  self care management, fxl trasnfers, AE/DME training, Family training in  prep for d/c home    3 Hour Rule Minutes: 60 minutes of OT treatment this session count towards  intensity and duration of therapy requirement. Patient was seen for the full  scheduled time of OT treatment this session.  Therapy Mode Minutes: Individual: 60 minutes.    Signed by: Murlean Caller, OTR/L 11/24/2015 2:00:00 PM

## 2015-11-25 NOTE — Rehab Progress Note ([facility]) (Signed)
NAMESUMEET GETER  MRN: [id number]  Account: [id number]  Session Start: 11/25/2015 12:00:00 AM  Session Stop: 11/25/2015 12:00:00 AM    SEVERE SEPSIS SCREEN  INFECTION:  Patient has no indication of infection.  Negative Sepsis Screen.  If you are unable to assess a system's dysfunction because you do not have labs,  or the labs you have are not current (within 24 hours), call physician and  request and order for the lab tests needed.    Signed by: Ulla Gallo, RN 11/25/2015 9:47:00 AM

## 2015-11-25 NOTE — Discharge Instr - Activity (Signed)
As tolerated

## 2015-11-25 NOTE — Rehab Discharge Instruction ([facility]) (Signed)
[name]  MRN: [id number]  Account: [id number]  Session Start: 11/25/2015 12:00:00 AM  Session Stop: 11/25/2015 12:00:00 AM    Rehabilitation Nursing  Inpatient Rehabilitation Discharge Instructions    Discharge Date: 11/25/15    Transportation: Patient's Mode of Transportation: Wheelchair Private Car  Accompanied By:  Family.    Recommendations for Follow-Up Care:   Yes, patient received valuables.  Bladder Program: Pt continent of bladder. No problems reported.  Bowel Program:  Last Bowel Movement- 11/24/15   Pt continent of bowel. No problems reported.  Skin: Back incision dressing changed, incision site c/d/i, no signs of infection  noted. Demonstrated dressing change with daughter present. Provided extra  dressing for home use.  Current Diet: Regular diet  Pain Management: Medication. Scheduled MS Contin; Dilaudid 2 mg every 4 hours as  needed. Follow up with orthopedic surgeon within one week. Outpatient PT/OT.    VITAL SIGNS  Blood Pressure:   mmHg  Temperature:   degrees  Pulse: 84  beats per minute  Respirations: 20  breaths per minute  Pain: 5/10    [place] Medications: Refer to separate medications list provided.        Please follow the home exercise programs provided by your therapists in addition  to the following recommendations.  Stop activity if shortness of breath or chest  pain occurs.    Call your physician if you experience excessive pain, fever, or other concerns.    PRECAUTIONS    A.  Don't move patient using affected arm or leg.  B.  Keep emergency numbers by the phone.  [place] brakes if using a wheelchair.  Move wheelchair foot rest prior to  standing.  D.  Remove obstacles from the home (throw rugs).  E.  Follow prescribed instructions.  F.  If you fall, call 911 for assistance.  G.  If you are on home oxygen then ensure that smoke detectors are present and  properly functioning. No smoking when oxygen is in use. Wear only non-flammable  clothing when oxygen is in use.    When  standing from a bed or chair, push off from bed/chair. Do not pull on  walker or helper. If using a wheelchair, always lock the brakes and remove leg  rests before getting in/out of wheelchair.    No driving until cleared by your primary care physician.      MEDICATION USE GUIDELINES    1. Become familiar with the names of your medication(s) and their appearance.  2. Know why you are taking each medication, what undesirable effects you may  expect, and when to contact your physician.  3. Consult with a pharmacist and /or your physician before using any over the  counter (OTC) medications.  4. Be compliant with your medication schedule.  If you feel you cannot, then  discuss alternatives with your physician.  5. If you are taking Enteric coated or extended/delayed release product(s), then  never crush, chew, open, cut, break or destroy by any means because serious  effects can occur.  6. When storing your medications, avoid hot and humid places (i.e., kitchen,  bathrooms).  7. Select a safe place out of children's reach to store medications.  Follow any  other special instructions.  8. Inform your doctors of all the medications you are taking, including those  you buy without a prescription.  9. Use the same pharmacy to buy your prescriptions whenever possible.    Signed by: Ulla Gallo, RN 11/25/2015 2:39:00  PM

## 2015-11-30 NOTE — Rehab PPS CMG ([facility]) (Signed)
Matthew [name]  MRN: [id number]  Account: [id number]  Session Start: 11/25/2015 12:00:00 AM  Session Stop: 11/25/2015 12:00:00 AM    PPS CMG Coordinator  Inpatient Rehabilitation Discharge    Mode of Locomotion: Walking.    Discharge Against Medical Advice:  No.  Discharge Information: Patient Discharged Alive:  Yes  Discharge Destination/Living Setting: Home with Home Health Services      Impairment Group: 08.9 Other Orthopedic    Comorbidities:  Rank Code      Description    1    I10.      Essential (primary) hypertension  2    K21.9     Gastro-esophageal reflux disease without                 esophagitis  3    D64.9     Anemia, unspecified  4    Z47.89    Encounter for other orthopedic aftercare  5    Z98.1     Arthrodesis status  6    Z85.828   Personal history of other malignant neoplasm of                 skin  7    L29.9     Pruritus, unspecified  8    I95.1     Orthostatic hypotension  9    G47.00    Insomnia, unspecified      ********************************  Complications:  Rank Code      Description    1    L29.9     Pruritus, unspecified  2    I95.1     Orthostatic hypotension  3    G47.00    Insomnia, unspecified      ********************************  PAI Bladder Accidents: 0  - Accidents.  Patient used medications/device this  shift.  11/23/2015 10:33:00 AM  Bladder Score = 6. Patient has not had an accident, but uses a  device/medication.  PAI Bowel Accident: 0  - Accidents.  Patient used medications/device this shift.   11/25/2015 1:00:00 AM  Bowel Score = 6. Patient has no accidents, but uses a device/medications.      MEDICAL NEEDS  Swallowing Status: Swallowing Status: Regular Food: solids and liquids swallowed  safely without supervision or modified food consistencies.    QUALITY INDICATORS    Section M. Skin Conditions Discharge  Unhealed Pressure Ulcer(s) at Stage 1 or Higher:  No  Current Number of Unhealed Pressure Ulcers    Number of Unhealed Stage 1: 0  Number of Unhealed Stage 2:  0  Number of Unhealed Stage 3: 0  Number of Unhealed Stage 4: 0  Number of Unhealed Unstageable Due to Non-removable Dressing: 0  Number of Unhealed Unstageable Due to Slough/Eschar: 0  Number of Unhealed Unstageable Due to Suspected Deep Tissue Injury: 0  Worsening in Pressure Ulcer Status Since Admission    Number of Worsening Stage 2: 0  Number of Worsening Stage 3: 0  Number of Worsening Stage 4: 0  Healed Pressure Ulcer(s)    Number of Healed Stage 1: 0  Number of Healed Stage 2: 0  Number of Healed Stage 3: 0  Number of Healed Stage 4: 0    [id number].Influenza Vaccine - Discharge: Received in this facility for this year's  influenza vaccination season:  No.  Influenza Vaccine Not Received Due To: Received outside of this facility.      Health Conditions: Fall(s) Since Admission:  No    Signed by: Asher Muir  Cranston Koors, PT, PPS Coordiantor 11/25/2015 4:00:00 PM

## 2015-12-02 ENCOUNTER — Emergency Department: Payer: Medicare Other

## 2015-12-02 ENCOUNTER — Inpatient Hospital Stay: Payer: Medicare Other | Admitting: Internal Medicine

## 2015-12-02 ENCOUNTER — Inpatient Hospital Stay
Admission: EM | Admit: 2015-12-02 | Discharge: 2015-12-04 | DRG: 176 | Disposition: A | Discharge status: Home / Self Care | Payer: Medicare Other | Attending: Internal Medicine | Admitting: Internal Medicine

## 2015-12-02 DIAGNOSIS — I2699 Other pulmonary embolism without acute cor pulmonale: Secondary | ICD-10-CM | POA: Insufficient documentation

## 2015-12-02 DIAGNOSIS — Z9889 Other specified postprocedural states: Secondary | ICD-10-CM

## 2015-12-02 DIAGNOSIS — M4806 Spinal stenosis, lumbar region: Secondary | ICD-10-CM | POA: Diagnosis present

## 2015-12-02 DIAGNOSIS — Z981 Arthrodesis status: Secondary | ICD-10-CM

## 2015-12-02 DIAGNOSIS — N400 Enlarged prostate without lower urinary tract symptoms: Secondary | ICD-10-CM | POA: Diagnosis present

## 2015-12-02 DIAGNOSIS — M48061 Spinal stenosis, lumbar region without neurogenic claudication: Secondary | ICD-10-CM | POA: Diagnosis present

## 2015-12-02 DIAGNOSIS — R748 Abnormal levels of other serum enzymes: Secondary | ICD-10-CM | POA: Diagnosis present

## 2015-12-02 DIAGNOSIS — E785 Hyperlipidemia, unspecified: Secondary | ICD-10-CM | POA: Diagnosis present

## 2015-12-02 DIAGNOSIS — K5900 Constipation, unspecified: Secondary | ICD-10-CM | POA: Diagnosis present

## 2015-12-02 DIAGNOSIS — I10 Essential (primary) hypertension: Secondary | ICD-10-CM | POA: Diagnosis present

## 2015-12-02 HISTORY — DX: Other pulmonary embolism without acute cor pulmonale: I26.99

## 2015-12-02 LAB — COMPREHENSIVE METABOLIC PANEL
ALT: 19 U/L (ref 0–55)
AST (SGOT): 16 U/L (ref 5–34)
Albumin/Globulin Ratio: 1.1 (ref 0.9–2.2)
Albumin: 3.2 g/dL — ABNORMAL LOW (ref 3.5–5.0)
Alkaline Phosphatase: 250 U/L — ABNORMAL HIGH (ref 38–106)
Anion Gap: 13 (ref 5.0–15.0)
BUN: 13.9 mg/dL (ref 9.0–28.0)
Bilirubin, Total: 0.5 mg/dL (ref 0.2–1.2)
CO2: 26 mEq/L (ref 22–29)
Calcium: 9.7 mg/dL (ref 7.9–10.2)
Chloride: 104 mEq/L (ref 100–111)
Creatinine: 1 mg/dL (ref 0.7–1.3)
Globulin: 2.9 g/dL (ref 2.0–3.6)
Glucose: 207 mg/dL — ABNORMAL HIGH (ref 70–100)
Potassium: 4.6 mEq/L (ref 3.5–5.1)
Protein, Total: 6.1 g/dL (ref 6.0–8.3)
Sodium: 143 mEq/L (ref 136–145)

## 2015-12-02 LAB — CBC AND DIFFERENTIAL
Basophils Absolute Automated: 0.03 10*3/uL (ref 0.00–0.20)
Basophils Automated: 0 %
Eosinophils Absolute Automated: 0.2 10*3/uL (ref 0.00–0.70)
Eosinophils Automated: 2 %
Hematocrit: 40.9 % — ABNORMAL LOW (ref 42.0–52.0)
Hgb: 13.6 g/dL (ref 13.0–17.0)
Lymphocytes Absolute Automated: 2.78 10*3/uL (ref 0.50–4.40)
Lymphocytes Automated: 28 %
MCH: 31.2 pg (ref 28.0–32.0)
MCHC: 33.3 g/dL (ref 32.0–36.0)
MCV: 93.8 fL (ref 80.0–100.0)
MPV: 8.8 fL — ABNORMAL LOW (ref 9.4–12.3)
Monocytes Absolute Automated: 0.88 10*3/uL (ref 0.00–1.20)
Monocytes: 9 %
Neutrophils Absolute: 6.01 10*3/uL (ref 1.80–8.10)
Neutrophils: 61 %
Platelets: 404 10*3/uL — ABNORMAL HIGH (ref 140–400)
RBC: 4.36 10*6/uL — ABNORMAL LOW (ref 4.70–6.00)
RDW: 12 % (ref 12–15)
WBC: 9.9 10*3/uL (ref 3.50–10.80)

## 2015-12-02 LAB — I-STAT TROPONIN: i-STAT Troponin: 0 ng/mL (ref 0.00–0.09)

## 2015-12-02 LAB — PT AND APTT
PT INR: 1.1 (ref 0.9–1.1)
PT: 13.7 s (ref 12.6–15.0)
PTT: 26 s (ref 23–37)

## 2015-12-02 LAB — MAGNESIUM: Magnesium: 1.4 mg/dL — ABNORMAL LOW (ref 1.6–2.6)

## 2015-12-02 LAB — IHS D-DIMER: D-Dimer: 3.98 ug/mL FEU — ABNORMAL HIGH (ref 0.00–0.51)

## 2015-12-02 LAB — PHOSPHORUS: Phosphorus: 3.8 mg/dL (ref 2.3–4.7)

## 2015-12-02 LAB — TSH: TSH: 0.83 u[IU]/mL (ref 0.35–4.94)

## 2015-12-02 LAB — GFR: EGFR: >60

## 2015-12-02 MED ORDER — IOHEXOL 350 MG/ML IV SOLN
100.0000 mL | Freq: Once | INTRAVENOUS | Status: AC | PRN
Start: 2015-12-02 — End: 2015-12-02
  Administered 2015-12-02: 100 mL via INTRAVENOUS
  Filled 2015-12-02: qty 100

## 2015-12-02 MED ORDER — HYDROMORPHONE HCL 1 MG/ML IJ SOLN
1.0000 mg | Freq: Once | INTRAMUSCULAR | Status: AC
Start: 2015-12-02 — End: 2015-12-02
  Administered 2015-12-02: 1 mg via INTRAVENOUS
  Filled 2015-12-02: qty 1

## 2015-12-02 MED ORDER — ENOXAPARIN SODIUM 100 MG/ML SC SOLN
1.0000 mg/kg | Freq: Once | SUBCUTANEOUS | Status: AC
Start: 2015-12-02 — End: 2015-12-02
  Administered 2015-12-02: 68 mg via SUBCUTANEOUS
  Filled 2015-12-02: qty 1

## 2015-12-02 MED ORDER — ENOXAPARIN SODIUM 100 MG/ML SC SOLN
100.0000 mg | Freq: Once | SUBCUTANEOUS | Status: DC
Start: 2015-12-02 — End: 2015-12-02

## 2015-12-02 MED ORDER — ONDANSETRON HCL 4 MG/2ML IJ SOLN
4.0000 mg | Freq: Three times a day (TID) | INTRAMUSCULAR | Status: DC | PRN
Start: 2015-12-02 — End: 2015-12-04

## 2015-12-02 MED ORDER — ACETAMINOPHEN 325 MG PO TABS
650.0000 mg | ORAL_TABLET | Freq: Four times a day (QID) | ORAL | Status: DC | PRN
Start: 2015-12-02 — End: 2015-12-04

## 2015-12-02 MED ORDER — SODIUM CHLORIDE 0.9 % IV BOLUS
1000.0000 mL | Freq: Once | INTRAVENOUS | Status: AC
Start: 2015-12-02 — End: 2015-12-02
  Administered 2015-12-02: 1000 mL via INTRAVENOUS

## 2015-12-02 MED ORDER — ENOXAPARIN SODIUM 80 MG/0.8ML SC SOLN
1.0000 mg/kg | Freq: Once | SUBCUTANEOUS | Status: DC
Start: 2015-12-02 — End: 2015-12-02

## 2015-12-02 NOTE — ED Notes (Signed)
Pt c/oL tachycardia, onset 1430 hours.  Pt states he was undergoing rehab when home health nurse note situation.  Pt was directed to E.D. For follow up.  Pt current denies: SOB, chest pain, nausea/vomiting

## 2015-12-02 NOTE — ED Provider Notes (Signed)
I have reviewed the case with the resident, Yvette Rack, and have discussed the treatment plan and ED management. I also have reviewed the HPI and Physical as documented and agree.    HPI:  74 y.o. M  has a past medical history of Hypertension; Syncopal episodes (approx 2012); BPH (benign prostatic hypertrophy) (2007); History of pancreatitis (2007); Spinal stenosis; Spondylolisthesis; Fibromyalgia; Gastroesophageal reflux disease; and Malignant neoplasm of skin. p/w acute onset tachycardia since 1430 this afternoon. Earlier today, pt at physical therapy session was found to have heart rate of 120-130s at rest with no palpitations. Pt with normal UOP and PO.   Pt recently d/c from hospital for L4 S1 laminectomy spinal fusion.  Denies sob, cp, pain elsewhere. No other complaints.     ** Please see other note per myself as Attestation Note for resident Dr. Tyler Aas!  Rachell Cipro, MD        EKG: interpreted by me: sinus tachycardia rate 123. Normal intervals, axis -33, moderate baseline artifact and t wave flattening inferior leads. Otherwise negative EKG.     Attestations:  I was acting as a Neurosurgeon for Nickola Lenig, Mcarthur Rossetti, MD on Alfonse Spruce D   Treatment Team: Scribe: Ivery Quale     I am the first provider for this patient and I personally performed the services documented. Treatment Team: Scribe: Ivery Quale is scribing for me on Lamar,Chrisopher D. This note accurately reflects work and decisions made by me.   Vara Guardian, MD    Vara Guardian, MD  12/13/15 [phone]

## 2015-12-02 NOTE — ED Notes (Signed)
Daughter now at bedside.

## 2015-12-02 NOTE — ED Provider Notes (Signed)
[facility] ACCESS [facility] EMERGENCY DEPARTMENT RESIDENT H&P       CLINICAL INFORMATION        HPI:      Chief Complaint: Tachycardia  .    Matthew [name] is a 74 y.o. male who presents with tachycardia today. He was recently discharged after L4-S1 laminectomy and fusion and is undergoing rehab with home PT/OT. Today he was found to be tachycardic to 130's before the PT visit so he was sent to the ED. No pain, anxiety, SOB, palpitations, chest discomfort, dizziness, nausea, headache, changes in vision, fevers/chills. He was lightheaded on standing. He is drinking the same amount of fluid as usual but is not eating well. He reports normal urine output and no difficulty with urination. He has not been sick recently.    History obtained from: patient             ROS:      Review of Systems   Constitutional: Positive for appetite change. Negative for fever, chills and activity change.   HENT: Negative for congestion, rhinorrhea, sinus pressure and sore throat.    Respiratory: Negative for cough, chest tightness and shortness of breath.    Cardiovascular: Negative for chest pain, palpitations and leg swelling.   Gastrointestinal: Negative for nausea, vomiting, abdominal pain and diarrhea.   Genitourinary: Negative for dysuria and decreased urine volume.   Musculoskeletal: Positive for back pain. Negative for neck pain.   Skin: Negative for color change.   Neurological: Positive for light-headedness. Negative for syncope, weakness and headaches.         Physical Exam:      Pulse (!) 120  BP 130/78 mmHg  Resp 20  SpO2 97 %  Temp 97.8 F (36.6 C)    Physical Exam   Constitutional: He is oriented to person, place, and time. He appears well-developed and well-nourished. No distress.   HENT:   Head: Normocephalic.   Nose: Nose normal.   Mouth/Throat: Oropharynx is clear and moist. No oropharyngeal exudate.   Eyes: Conjunctivae and EOM are normal.   Neck: Normal range of motion. Neck supple.   Cardiovascular:  Regular rhythm, normal heart sounds and intact distal pulses.  Tachycardia present.    Pulmonary/Chest: Effort normal and breath sounds normal. No respiratory distress. He has no wheezes. He has no rales. He exhibits no tenderness.   Abdominal: Soft. He exhibits no distension. There is no tenderness.   Musculoskeletal: Normal range of motion.   Lymphadenopathy:     He has no cervical adenopathy.   Neurological: He is alert and oriented to person, place, and time.   Skin: Skin is warm and dry.   Nursing note and vitals reviewed.              PAST HISTORY        Primary Care Provider: Bobby Rumpf, MD        PMH/PSH:    .     Past Medical History   Diagnosis Date   . Hypertension      controlled   . Syncopal episodes approx 2012     1x episode - had negative cardiac work-up - no episodes sinice   . BPH (benign prostatic hypertrophy) 2007     w/ hx of obstruction - s/p suprapubic prostatectomy   . History of pancreatitis 2007     had microscopic stones - ERCP w/ stent done   . Spinal stenosis    . Spondylolisthesis    . Fibromyalgia  in remission   . Gastroesophageal reflux disease    . Malignant neoplasm of skin      removed from forehead - does not remember type of skin ca       He has past surgical history that includes ERCP, STENT (2007); Suprapubic prostatectomy (2007); Cholecystectomy (1980); Vasectomy (1980's); Cervical fusion (1997); Appendectomy (1952); Tonsillectomy (as child); and LAMINECTOMY, POSTERIOR LUMBAR, DECOMP, FUSION, LEVEL 2 (N/A, 11/10/2015).      Social/Family History:      He reports that he has never smoked. He has never used smokeless tobacco. He reports that he drinks alcohol. He reports that he does not use illicit drugs.    History reviewed. No pertinent family history.      Listed Medications on Arrival:    .     Home Medications                   ALPRAZolam (XANAX) 0.5 MG tablet     Take 1 tablet (0.5 mg total) by mouth 2 (two) times daily as needed for Anxiety.     atorvastatin  (LIPITOR) 10 MG tablet     Take 1 tablet (10 mg total) by mouth nightly.     BELSOMRA 20 MG Tab     Take 20 mg by mouth nightly.        cyclobenzaprine (FLEXERIL) 5 MG tablet     Take 1 tablet (5 mg total) by mouth 3 (three) times daily as needed for Muscle spasms.     HYDROmorphone (DILAUDID) 2 MG tablet     Take 1 tablet (2 mg total) by mouth every 4 (four) hours as needed.     lisinopril (PRINIVIL,ZESTRIL) 5 MG tablet     Take 1 tablet (5 mg total) by mouth daily.     morphine (MS CONTIN) 15 MG Tab CR 12 hr tablet     Take 1 tablet (15 mg total) by mouth every 8 (eight) hours.     pantoprazole (PROTONIX) 40 MG tablet     Take 1 tablet (40 mg total) by mouth daily.     pramipexole (MIRAPEX) 0.25 MG tablet     Take 1 tablet (0.25 mg total) by mouth every 12 (twelve) hours.         Allergies: He has No Known Allergies.            VISIT INFORMATION        Reassessments/Clinical Course:      Orthostatic vitals done: HR increased from 109 to 130 on standing, BP unchanged, patient felt dizzy consistent with dehydration, 1L NS given  CBC, CMP, Mag, Phos, troponin normal  D dimer elevated, CTa shows bilateral PE, lovenox given  TSH and T4 pending      Conversations with Other Providers:        Discussed with Dr. Ronny Flurry, hospitalist at [facility]. Agreed to admit for observation for anticoagulation      Medications Given in the ED:    .     ED Medication Orders     Start Ordered     Status Ordering Provider    12/02/15 1911 12/02/15 1910  enoxaparin (LOVENOX) syringe 68 mg   Once     Route: Subcutaneous  Ordered Dose: 1 mg/kg     Last MAR action:  Given Yvette Rack J    12/02/15 1908 12/02/15 1907     Once,   Status:  Discontinued     Route: Subcutaneous  Ordered Dose: 100  mg     Discontinued Yvette Rack J    12/02/15 1844 12/02/15 1843     Once,   Status:  Discontinued     Route: Subcutaneous  Ordered Dose: 1 mg/kg     Discontinued Reine Bristow J    12/02/15 1553 12/02/15 1552  HYDROmorphone (DILAUDID)  injection 1 mg   Once     Route: Intravenous  Ordered Dose: 1 mg     Last MAR action:  Given Ozetta Flatley J    12/02/15 1523 12/02/15 1522  sodium chloride 0.9 % bolus 1,000 mL   Once     Route: Intravenous  Ordered Dose: 1,000 mL     Last MAR action:  Stopped HERMES, DANIEL J            Procedures:      Procedures      Assessment/Plan:      74 y.o male with recent L4-S1 laminectomy and spinal fusion with pulmonary embolism causing tachycardia. Hemodynamically stable. Admit to [facility] for anticoagulation.           Darcella Cheshire, MD  Resident  12/02/15 [phone]

## 2015-12-02 NOTE — ED Notes (Signed)
Pt requesting dilaudid, states "it's just about time for my regularly scheduled dose of dilaudid and I think I really need it"; Dr. Cheral Almas aware

## 2015-12-02 NOTE — ED Provider Notes (Signed)
EMERGENCY DEPARTMENT HISTORY AND PHYSICAL EXAM  I have reviewed the case with the resident, Yvette Rack, and have discussed the treatment plan and ED management. I also have reviewed the HPI and Physical as documented and agree.    I have seen and examined Matthew [name], who presents entirely asymptomatically to the ED for evaluation of tachycardia which was noted by his PT caregiver earlier today. Sent in to R/O ? PE, dehydration  He denies CP, lightheadedness, SOB  No Fevers/ABd pain of other Sx    Constitutional: Vital signs reviewed. Well appearing, chatty and pleasant  Head: Normocephalic, atraumatic  Eyes: No conjunctival injection. No discharge.  ENT: Posterior pharynx normal, mucosa tacky  Neck: Normal range of motion. Non-tender.  Respiratory/Chest: Clear to auscultation. No respiratory distress.   Cardiovascular: tachy rate. No murmur.    Back:  No CVA tenderness.  No spine tenderness.    Neurological: A&Ox3, moving all four extremities symmetrically.    Ext: no STS, no calf ttp, pulses x 2  Skin: Warm and dry. No rash.  Psychiatric: Normal affect. Normal concentration.  Plan: Check ECG, labs, Orthostatics, consider CT AAA (d-dimer)     Addendum: given the positive d-dimer, CT Angio was performed and demonstrated bilateral basilar PE's for which Matthew [name] was to be admitted. Low-molecular weight heparin given in the ED and admitted to the [facility] for further evaluation and Rx.      Vara Guardian, MD  12/06/15 1630

## 2015-12-02 NOTE — H&P (Addendum)
ADMISSION HISTORY AND PHYSICAL EXAM    Date Time: 12/03/2015 4:47 AM  Patient Name: Matthew [name]  Attending Physician: Diona Foley, MD  Primary Care Physician: Bobby Rumpf, MD    CC: high heart rate      Assessment:   Principal Problem:    Pulmonary embolism, bilateral  Active Problems:    Degenerative lumbar spinal stenosis    Hypertension    BPH (benign prostatic hypertrophy)    History of lumbar laminectomy for spinal cord decompression          70M with h/o lumbar spinal stenosis, s/p fusion of L4-S1 on 11/10/15, referred to ED after home health nurse noticed that he was tachycardic, found to have bilateral PE on imaging. Patient with normal blood pressure, no chest pain or hypoxia and trop 0.00    Plan:   - med/tele observation admission  - check bilateral lower extremity dopplers  - received lovenox in the ed; will start xarelto  - continue home medications including ms contin and po dilaudid prn, felxeril, mirapex  - continue lipitor for dyslipidemia  - norvasc for HTN  - pepcid bid  - will check GGT given elevated alk phos  - replete Mag  - anticipate d/c home tomorrow if pain controlled and HR improved      Disposition:     Today's date: 12/03/2015  Admit Date: 12/02/2015  3:06 PM  Anticipated medical stability for discharge:Yellow - maybe tomorrow  Service status: Observation:patient is stable and improving.  Clinical Milestones: improved heart rate, pain ok  Anticipated discharge needs: none    History of Presenting Illness:   Matthew [name] is a 74 y.o. male with spinal lumbar stenosis s/p L4-S1 fusion on 11/10/15, who was referred to the ED by home health nurse after she noted that he was tachycardic at home. Following his surgery, patient was d/c'd to [facility] rehab where he was from 12/9-12/16. He states that he was doing well at home, was weaning off his pain medication and working with PT. He denies any chest pain, shortness of breath or presyncope. He did note R knee  fullness/pain a couple of weeks ago while at rehab, on 12/9 had bilateral lower extremity dopplers that were neg for dvt.     In the ED he received lovenox 1mg /kg and was admitted. He currently denies any chest pain, c/o lower back pain from a bumpy ride to the hospital and missed oral pain medication doses.     Past Medical History:     Past Medical History   Diagnosis Date   . Hypertension      controlled   . Syncopal episodes approx 2012     1x episode - had negative cardiac work-up - no episodes sinice   . BPH (benign prostatic hypertrophy) 2007     w/ hx of obstruction - s/p suprapubic prostatectomy   . History of pancreatitis 2007     had microscopic stones - ERCP w/ stent done   . Spinal stenosis    . Spondylolisthesis    . Fibromyalgia      in remission   . Gastroesophageal reflux disease    . 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 Ercp,  stent  2007   . Suprapubic prostatectomy  2007   . Cholecystectomy  1980   . Vasectomy  1980's   . Cervical fusion  1997     states has full ROM of neck   . Appendectomy  1952   . Tonsillectomy  as child   . Laminectomy, posterior lumbar, decomp, fusion, level 2 N/A 11/10/2015     Procedure: LAMINECTOMY, POSTERIOR LUMBAR, DECOMP, FUSION, LEVEL 2;  Surgeon: Clelia Schaumann, MD;  Location: [facility];  Service: Orthopedics;  Laterality: N/A;  L4-S1 LAMINECTOMY/FUSION       Family History:   History reviewed. No pertinent family history.      Social History:     History   Smoking status   . Never Smoker    Smokeless tobacco   . Never Used     History   Alcohol Use   . Yes     Comment: usually drinks 1 glass wine per night - none for past few months while on pain meds     History   Drug Use No       Allergies:   No Known Allergies    Medications:     Current Discharge Medication List       CONTINUE these medications which have NOT CHANGED    Details   atorvastatin (LIPITOR) 10 MG tablet Take 1 tablet (10 mg total) by mouth nightly.  Qty: 30 tablet, Refills: 0      BELSOMRA 20 MG Tab Take 20 mg by mouth nightly.         cyclobenzaprine (FLEXERIL) 5 MG tablet Take 1 tablet (5 mg total) by mouth 3 (three) times daily as needed for Muscle spasms.  Qty: 30 tablet, Refills: 0      docusate sodium (COLACE) 100 MG capsule Take 100 mg by mouth 2 (two) times daily as needed for Constipation.      pantoprazole (PROTONIX) 40 MG tablet Take 1 tablet (40 mg total) by mouth daily.  Qty: 30 tablet, Refills: 0      pramipexole (MIRAPEX) 0.25 MG tablet Take 1 tablet (0.25 mg total) by mouth every 12 (twelve) hours.  Qty: 60 tablet, Refills: 0               Method by which medications were confirmed on admission: verbal review with patient    Review of Systems:   All other systems were reviewed and are negative except: for hPI    Physical Exam:     Patient Vitals for the past 24 hrs:   BP Temp Temp src Pulse Resp SpO2 Height Weight   12/03/15 0337 102/53 mmHg (!) 96.4 F (35.8 C) Oral 89 16 95 % - -   12/02/15 2344 113/61 mmHg 98.1 F (36.7 C) Oral 90 18 95 % - -   12/02/15 2142 - - - (!) 106 - 96 % - -   12/02/15 2110 113/69 mmHg 98.8 F (37.1 C) Oral 100 18 98 % - -   12/02/15 1956 115/63 mmHg - - 94 18 - - -   12/02/15 1913 - - - - 20 - - -   12/02/15 1830 102/73 mmHg - - 88 (!) 50 95 % - -   12/02/15 1656 - - - 100 13 98 % - -   12/02/15 1649 112/57 mmHg - - (!) 103 14 97 % - -   12/02/15 1604 - - - (!) 107 18 97 % - -  12/02/15 1540 115/70 mmHg - - (!) 109 - - - -   12/02/15 1530 150/82 mmHg - - (!) 113 21 97 % - -   12/02/15 1506 130/78 mmHg 97.8 F (36.6 C) Oral (!) 120 20 97 % 1.778 m (5\' 10" ) 68.04 kg (150 lb)     Body mass index is 21.52 kg/(m^2).  No intake or output data in the 24 hours ending 12/03/15 0447    General: awake, alert, oriented x 3; no acute distress, resting comfortably in bed  HEENT:  perrla, eomi, sclera anicteric  oropharynx clear without lesions, mucous membranes moist  Neck: supple, no lymphadenopathy, no thyromegaly, no JVD  Cardiovascular: regular tachycardia, no murmurs, rubs or gallops  Lungs: clear to auscultation bilaterally, without wheezing, rhonchi, or rales  Abdomen: soft, non-tender, non-distended; no palpable masses, no hepatosplenomegaly, normoactive bowel sounds, no rebound or guarding  Back: mild tenderness to palpation of the spine and mm at surgical site, but wound is well healed, no erythema or warmth  Extremities: no clubbing, cyanosis, or edema  Neuro: cranial nerves grossly intact, strength 5/5 in upper and lower extremities, sensation intact,   Skin: no rashes or lesions noted      Labs:     Results     Procedure Component Value Units Date/Time    Basic metabolic panel [[id number]] Collected:  12/03/15 0341    Specimen Information:  Blood Updated:  12/03/15 0341    Narrative:      If not done within 48 hours.    GGT [[id number]] Collected:  12/03/15 0341     Updated:  12/03/15 0341    Narrative:      If not done within 48 hours.    CBC [[id number]] Collected:  12/03/15 0341    Specimen Information:  Blood from Blood Updated:  12/03/15 0341    Narrative:      If not done within 48 hours.    Protime-INR [[id number]] Collected:  12/03/15 0341    Specimen Information:  Blood Updated:  12/03/15 0341    Narrative:      If not done within 48 hours.    APTT [[id number]] Collected:  12/03/15 0341     Updated:  12/03/15 0341    Narrative:      If not done within 48 hours.    TSH [[id number]] Collected:  12/02/15 1520    Specimen Information:  Blood Updated:  12/02/15 2146     Thyroid Stimulating Hormone 0.83 uIU/mL     T4, free [[id number]] Collected:  12/02/15 1520    Specimen Information:  Blood Updated:  12/02/15 2114    D-Dimer [[id number]]  (Abnormal) Collected:  12/02/15 1520     D-Dimer 3.98 (H) ug/mL FEU Updated:  12/02/15 1621    PT/APTT [[id number]]  (Abnormal) Collected:   12/02/15 1520     PT 13.7 sec Updated:  12/02/15 1621     PT INR 1.1      PT Anticoag. Given Within 48 hrs. None (A)      PTT 26 sec     i-Stat Troponin [[id number]] Collected:  12/02/15 1520     i-STAT Troponin 0.00 ng/mL Updated:  12/02/15 1613    Phosphorus [[id number]] Collected:  12/02/15 1520    Specimen Information:  Blood Updated:  12/02/15 1605     Phosphorus 3.8 mg/dL     Magnesium [[id number]]  (Abnormal) Collected:  12/02/15 1520    Specimen Information:  Blood Updated:  12/02/15 1605  Magnesium 1.4 (L) mg/dL     Comprehensive Metabolic Panel (CMP) [[id number]]  (Abnormal) Collected:  12/02/15 1520    Specimen Information:  Blood Updated:  12/02/15 1546     Glucose 207 (H) mg/dL      BUN 27.2 mg/dL      Creatinine 1.0 mg/dL      Sodium 536 mEq/L      Potassium 4.6 mEq/L      Chloride 104 mEq/L      CO2 26 mEq/L      Calcium 9.7 mg/dL      Protein, Total 6.1 g/dL      Albumin 3.2 (L) g/dL      AST (SGOT) 16 U/L      ALT 19 U/L      Alkaline Phosphatase 250 (H) U/L      Bilirubin, Total 0.5 mg/dL      Globulin 2.9 g/dL      Albumin/Globulin Ratio 1.1      Anion Gap 13.0     GFR [[id number]] Collected:  12/02/15 1520     EGFR >60.0 Updated:  12/02/15 1546    CBC with Differential [[id number]]  (Abnormal) Collected:  12/02/15 1520    Specimen Information:  Blood from Blood Updated:  12/02/15 1530     WBC 9.90 x10 3/uL      Hgb 13.6 g/dL      Hematocrit 43.3 (L) %      Platelets 404 (H) x10 3/uL      RBC 4.36 (L) x10 6/uL      MCV 93.8 fL      MCH 31.2 pg      MCHC 33.3 g/dL      RDW 12 %      MPV 8.8 (L) fL      Neutrophils 61 %      Lymphocytes Automated 28 %      Monocytes 9 %      Eosinophils Automated 2 %      Basophils Automated 0 %      Neutrophils Absolute 6.01 x10 3/uL      Abs Lymph Automated 2.78 x10 3/uL      Abs Mono Automated 0.88 x10 3/uL      Abs Eos Automated 0.20 x10 3/uL      Absolute Baso Automated 0.03 x10 3/uL           Imaging personally reviewed, including:     CTA chest: small bibasilar  PE    Safety Checklist  DVT prophylaxis:  CHEST guideline (See page e199S) Chemical and / or mechanical ppx NOT indicated or contraindicated: Fully anticoagulated    Foley:  [place] [facility] Foley protocol Not present   IVs:  Peripheral IV   PT/OT: Not needed   Daily CBC & or Chem ordered:  SHM/ABIM guidelines (see #5) No   Reference for approximate charges of common labs: CBC auto diff - $76  BMP - $99  Mg - $79    Signed by: Rondel Jumbo, MD   cc:Pcp, [facility], MD

## 2015-12-02 NOTE — Progress Notes (Signed)
Patient arrived to unit via stretcher with transporter. Oriented to room and call bell system. VSS, tachycardic to 100s, oxygen saturations stable mid 90s. No SOB, chest pain, dizziness reported. Reports some back pain. Will cont to monitor.

## 2015-12-03 ENCOUNTER — Observation Stay: Payer: Medicare Other

## 2015-12-03 LAB — ECG 12-LEAD
Atrial Rate: 123 {beats}/min
P Axis: 32 degrees
P-R Interval: 138 ms
Q-T Interval: 302 ms
QRS Duration: 82 ms
QTC Calculation (Bezet): 432 ms
R Axis: -33 degrees
T Axis: 24 degrees
Ventricular Rate: 123 {beats}/min

## 2015-12-03 LAB — BASIC METABOLIC PANEL
BUN: 10 mg/dL (ref 9.0–28.0)
CO2: 24 mEq/L (ref 21–30)
Calcium: 8.8 mg/dL (ref 7.9–10.2)
Chloride: 106 mEq/L (ref 100–111)
Creatinine: 0.9 mg/dL (ref 0.5–1.5)
Glucose: 135 mg/dL — ABNORMAL HIGH (ref 70–100)
Potassium: 4.3 mEq/L (ref 3.5–5.3)
Sodium: 137 mEq/L (ref 135–146)

## 2015-12-03 LAB — GGT: GGT: 52 U/L (ref 12–64)

## 2015-12-03 LAB — CBC
Hematocrit: 36.7 % — ABNORMAL LOW (ref 42.0–52.0)
Hgb: 11.6 g/dL — ABNORMAL LOW (ref 13.0–17.0)
MCH: 29.8 pg (ref 28.0–32.0)
MCHC: 31.6 g/dL — ABNORMAL LOW (ref 32.0–36.0)
MCV: 94.3 fL (ref 80.0–100.0)
MPV: 9.2 fL — ABNORMAL LOW (ref 9.4–12.3)
Nucleated RBC: 0 /100 WBC (ref 0–1)
Platelets: 353 10*3/uL (ref 140–400)
RBC: 3.89 10*6/uL — ABNORMAL LOW (ref 4.70–6.00)
RDW: 12 % (ref 12–15)
WBC: 5.81 10*3/uL (ref 3.50–10.80)

## 2015-12-03 LAB — T4, FREE: T4 Free: 0.98 ng/dL (ref 0.70–1.48)

## 2015-12-03 LAB — HEMOLYSIS INDEX: Hemolysis Index: 30 — ABNORMAL HIGH (ref 0–18)

## 2015-12-03 LAB — GFR: EGFR: >60

## 2015-12-03 MED ORDER — CYCLOBENZAPRINE HCL 10 MG PO TABS
5.0000 mg | ORAL_TABLET | Freq: Three times a day (TID) | ORAL | Status: DC | PRN
Start: 2015-12-03 — End: 2015-12-04
  Administered 2015-12-03 (×2): 5 mg via ORAL
  Filled 2015-12-03 (×2): qty 1

## 2015-12-03 MED ORDER — MAGNESIUM SULFATE IN D5W 10-5 MG/ML-% IV SOLN
1.0000 g | INTRAVENOUS | Status: AC
Start: 2015-12-03 — End: 2015-12-03
  Administered 2015-12-03 (×3): 1 g via INTRAVENOUS
  Filled 2015-12-03 (×3): qty 100

## 2015-12-03 MED ORDER — TRAZODONE HCL 50 MG PO TABS
25.0000 mg | ORAL_TABLET | Freq: Every evening | ORAL | Status: DC
Start: 2015-12-03 — End: 2015-12-04
  Administered 2015-12-03: 25 mg via ORAL
  Filled 2015-12-03: qty 1

## 2015-12-03 MED ORDER — PRAMIPEXOLE DIHYDROCHLORIDE 0.5 MG PO TABS
0.2500 mg | ORAL_TABLET | Freq: Two times a day (BID) | ORAL | Status: DC
Start: 2015-12-03 — End: 2015-12-04
  Administered 2015-12-03 – 2015-12-04 (×3): 0.25 mg via ORAL
  Filled 2015-12-03 (×3): qty 1

## 2015-12-03 MED ORDER — NALOXONE HCL 0.4 MG/ML IJ SOLN (WRAP)
0.4000 mg | INTRAMUSCULAR | Status: DC | PRN
Start: 2015-12-03 — End: 2015-12-04

## 2015-12-03 MED ORDER — ATORVASTATIN CALCIUM 10 MG PO TABS
10.0000 mg | ORAL_TABLET | Freq: Every evening | ORAL | Status: DC
Start: 2015-12-03 — End: 2015-12-04
  Administered 2015-12-03: 10 mg via ORAL
  Filled 2015-12-03: qty 1

## 2015-12-03 MED ORDER — RIVAROXABAN 10 MG PO TABS
20.0000 mg | ORAL_TABLET | Freq: Every day | ORAL | Status: DC
Start: 2015-12-24 — End: 2015-12-04

## 2015-12-03 MED ORDER — DOCUSATE SODIUM 100 MG PO CAPS
100.0000 mg | ORAL_CAPSULE | Freq: Two times a day (BID) | ORAL | Status: DC | PRN
Start: 2015-12-03 — End: 2015-12-04
  Filled 2015-12-03: qty 1

## 2015-12-03 MED ORDER — HYDROMORPHONE HCL 2 MG PO TABS
2.0000 mg | ORAL_TABLET | ORAL | Status: DC | PRN
Start: 2015-12-03 — End: 2015-12-04
  Administered 2015-12-03 – 2015-12-04 (×8): 2 mg via ORAL
  Filled 2015-12-03 (×8): qty 1

## 2015-12-03 MED ORDER — MORPHINE SULFATE ER 15 MG PO TBCR
15.0000 mg | EXTENDED_RELEASE_TABLET | Freq: Two times a day (BID) | ORAL | Status: DC
Start: 2015-12-03 — End: 2015-12-04
  Administered 2015-12-03 – 2015-12-04 (×3): 15 mg via ORAL
  Filled 2015-12-03 (×3): qty 1

## 2015-12-03 MED ORDER — HYDROMORPHONE HCL 1 MG/ML IJ SOLN
1.0000 mg | Freq: Once | INTRAMUSCULAR | Status: AC
Start: 2015-12-03 — End: 2015-12-03
  Administered 2015-12-03: 1 mg via INTRAVENOUS
  Filled 2015-12-03: qty 1

## 2015-12-03 MED ORDER — RIVAROXABAN 10 MG PO TABS
15.0000 mg | ORAL_TABLET | Freq: Two times a day (BID) | ORAL | Status: DC
Start: 2015-12-03 — End: 2015-12-04
  Administered 2015-12-03 – 2015-12-04 (×3): 15 mg via ORAL
  Filled 2015-12-03 (×3): qty 2

## 2015-12-03 MED ORDER — FAMOTIDINE 20 MG PO TABS
20.0000 mg | ORAL_TABLET | Freq: Two times a day (BID) | ORAL | Status: DC
Start: 2015-12-03 — End: 2015-12-04
  Administered 2015-12-03 – 2015-12-04 (×4): 20 mg via ORAL
  Filled 2015-12-03 (×4): qty 1

## 2015-12-03 NOTE — Plan of Care (Signed)
Problem: Safety  Goal: Patient will be free from injury during hospitalization  Outcome: Progressing  Bed in low position, side rails up, floor mats down, call bell and bedside table within reach, hourly rounding. Family updated.     Problem: Pain  Goal: Patient's pain/discomfort is manageable  Outcome: Progressing  Analgesics administered prn. Rest and repositioning encouraged.     Comments:   AOx4. VSS. Sinus tachycardic, overnight stable at low 100s. No complaints of chest pain or SOB. Pain only reported to back. Pt on tele and continuous pulse ox, oxygenation stable overnight mid 90s on RA. SCDs off per request of MD. Dilaudid 2mg  PO given for pain prn. Will cont to monitor.

## 2015-12-03 NOTE — Progress Notes (Signed)
AOx4,sinus tachycardic,steble at low 110s,no C/O chest pain OOB ambulated in the room.

## 2015-12-03 NOTE — UM Notes (Signed)
12/02/15 1839    Place (admit) on Observation Services       Per Dr Talbert Nan, pt is a "56M with h/o lumbar spinal stenosis, s/p fusion of L4-S1 on 11/10/15, referred to ED after home health nurse noticed that he was tachycardic, found to have bilateral PE on imaging. Patient with normal blood pressure, no chest pain or hypoxia and trop 0.00"    Initial VS:  97.8, 109, 21, 97% sats,  115/70.        Plan:  - med/tele observation admission  - check bilateral lower extremity dopplers  - received lovenox in the ed; will start xarelto  - continue home medications including ms contin and po dilaudid prn, felxeril, mirapex  - continue lipitor for dyslipidemia  - norvasc for HTN  - pepcid bid  - will check GGT given elevated alk phos  - replete Mag  - anticipate d/c home tomorrow (12/24)  if pain controlled and HR improved

## 2015-12-03 NOTE — Plan of Care (Signed)
Problem: Safety  Goal: Patient will be free from injury during hospitalization  Outcome: Progressing  Intervention: Hourly rounding.  Maintain hourly rounding.      Problem: Pain  Goal: Patient's pain/discomfort is manageable  Outcome: Progressing  Intervention: Offer non-pharmocological pain management interventions  Pain medicine.

## 2015-12-04 ENCOUNTER — Other Ambulatory Visit: Payer: Self-pay

## 2015-12-04 DIAGNOSIS — N400 Enlarged prostate without lower urinary tract symptoms: Secondary | ICD-10-CM

## 2015-12-04 LAB — CBC
Hematocrit: 38.4 % — ABNORMAL LOW (ref 42.0–52.0)
Hgb: 12 g/dL — ABNORMAL LOW (ref 13.0–17.0)
MCH: 29.3 pg (ref 28.0–32.0)
MCHC: 31.3 g/dL — ABNORMAL LOW (ref 32.0–36.0)
MCV: 93.9 fL (ref 80.0–100.0)
MPV: 9.3 fL — ABNORMAL LOW (ref 9.4–12.3)
Nucleated RBC: 2 /100 WBC — ABNORMAL HIGH (ref 0–1)
Platelets: 326 10*3/uL (ref 140–400)
RBC: 4.09 10*6/uL — ABNORMAL LOW (ref 4.70–6.00)
RDW: 12 % (ref 12–15)
WBC: 5.23 10*3/uL (ref 3.50–10.80)

## 2015-12-04 MED ORDER — HYDROMORPHONE HCL 2 MG PO TABS
2.0000 mg | ORAL_TABLET | Freq: Four times a day (QID) | ORAL | 0 refills | Status: AC | PRN
Start: 2015-12-04 — End: 2015-12-11
  Filled 2015-12-04: qty 20, 5d supply, fill #0

## 2015-12-04 MED ORDER — LISINOPRIL 10 MG PO TABS
ORAL_TABLET | Freq: Every day | ORAL | Status: DC
Start: 2015-12-04 — End: 2015-12-04
  Filled 2015-12-04 (×2): qty 1

## 2015-12-04 MED ORDER — AMLODIPINE BESY-BENAZEPRIL HCL 10-20 MG PO CAPS
ORAL_CAPSULE | ORAL | Status: AC
Start: 2015-12-04 — End: ?

## 2015-12-04 MED ORDER — RIVAROXABAN 20 MG PO TABS
20.0000 mg | ORAL_TABLET | Freq: Every day | ORAL | 0 refills | Status: AC
Start: 2015-12-24 — End: 2016-01-24
  Filled 2015-12-04: qty 30, 30d supply, fill #0

## 2015-12-04 MED ORDER — MORPHINE SULFATE ER 15 MG PO TBCR
15.0000 mg | EXTENDED_RELEASE_TABLET | Freq: Two times a day (BID) | ORAL | 0 refills | Status: AC
Start: 2015-12-04 — End: 2015-12-15
  Filled 2015-12-04: qty 21, 11d supply, fill #0

## 2015-12-04 MED ORDER — ALPRAZOLAM 0.5 MG PO TABS
0.5000 mg | ORAL_TABLET | Freq: Two times a day (BID) | ORAL | 0 refills | Status: AC | PRN
Start: 2015-12-04 — End: 2015-12-11
  Filled 2015-12-04: qty 14, 7d supply, fill #0

## 2015-12-04 MED ORDER — RIVAROXABAN 15 MG PO TABS
15.0000 mg | ORAL_TABLET | Freq: Two times a day (BID) | ORAL | 0 refills | Status: AC
Start: 2015-12-04 — End: 2015-12-23
  Filled 2015-12-04: qty 38, 19d supply, fill #0

## 2015-12-04 NOTE — Plan of Care (Signed)
Problem: Pain  Goal: Patient's pain/discomfort is manageable  Intervention: Include patient/family/caregiver in decisions related to pain management  Pt complained right knew and lower back pain 4-5/10 dilaudid 2 mg po given with good relief.       Comments:   Pt alert and oriented x 3, denies numbness and tingling, moves bilateral LE equal and strong. Pt ambulated in the hallways with walker and standby assist. Tele sinus rhythm to sinus tachy with activity. O2 sats 94 % RA, encouraged pt to use IS.  Pt stable resting well, will continue to monitor.

## 2015-12-04 NOTE — Plan of Care (Signed)
Problem: Safety  Goal: Patient will be free from injury during hospitalization  Outcome: Adequate for Discharge    Comments:   Given discharged instruction with instructed to patient. Follow- up with primary doctor as scheduled. Verbalized to understand. Discharged by wheel- chair with assistance.

## 2015-12-04 NOTE — Progress Notes (Signed)
MEDICINE PROGRESS NOTE    Date Time: 12/04/2015 7:07 AM  Patient Name: Matthew [name]  Attending Physician: Diona Foley, MD    Assessment:   Principal Problem:    Pulmonary embolism, bilateral  Active Problems:    Degenerative lumbar spinal stenosis    Hypertension    BPH (benign prostatic hypertrophy)    History of lumbar laminectomy for spinal cord decompression  Resolved Problems:    * No resolved hospital problems. *    Identifier: 72M with h/o lumbar spinal stenosis, s/p fusion of L4-S1 on 11/10/15 with prolonged post-op immobilization, referred to ED after home health nurse noted tachycardia to 130s at rest, found to have bilateral PE on imaging. LE dopplers revealed no DVT. Patient with normal blood pressure, no chest pain or hypoxia and trop 0.00. Feels well overall but with continued tachycardia and some subjective shortness of breath with ambulation.    Plan:   - continue xarelto with follow up with PCP (patient is from [place])  - continue home medications including ms contin and po dilaudid prn, felxeril, mirapex  - continue lipitor for dyslipidemia  - norvasc for HTN  - pepcid bid  - GGT normal. Checked in setting of elevated alk phos  - anticipate d/c home tomorrow if pain controlled and HR improved    Case discussed with: patient and nurse    Safety Checklist:     DVT prophylaxis:  CHEST guideline (See page e199S) Chemical   Foley:  [place] R[facility] Foley protocol Not present   IVs:  Peripheral IV   PT/OT: Ordered   Daily CBC & or Chem ordered:  SHM/ABIM guidelines (see #5) Will d/c as no longer needed   Reference for approximate charges of common labs: CBC auto diff - $76  BMP - $99  Mg - $79    Lines:     Patient Lines/Drains/Airways Status    Active PICC Line / CVC Line / PIV Line / Drain / Airway / Intraosseous Line / Epidural Line / ART Line / Line / Wound / Pressure Ulcer / NG/OG Tube     Name:   Placement date:   Placement time:   Site:   Days:    Peripheral IV 12/02/15 Left  12/02/15   1520       1                 Disposition:     Today's date: 12/04/2015  Admit Date: 12/02/2015  3:06 PM  LOS: 1  Anticipated medical stability for discharge:Green - definitely tomorrow  Clinical Milestones: improved shortness of breath and tachycardia  Anticipated discharge needs: None      Subjective     CC: Pulmonary embolism, bilateral    Interval History/24 hour events: Tachycardia to 100s overnight and with standing up.     HPI/Subjective: Feels well - better than at home. Relieved that PE is small.  NO fevers or chest pain.    Review of Systems:     Per HPI    Physical Exam:     VITAL SIGNS PHYSICAL EXAM   Temp:  [97.4 F (36.3 C)-99.3 F (37.4 C)] 97.4 F (36.3 C)  Heart Rate:  [81-94] 81  Resp Rate:  [16-18] 16  BP: (117-130)/(63-77) 121/66 mmHg  Blood Glucose: 130s-200s    Telemetry: sinus      Intake/Output Summary (Last 24 hours) at 12/04/15 0707  Last data filed at 12/04/15 0243   Gross per 24 hour   Intake  480 ml   Output      0 ml   Net    480 ml    Physical Exam  General: awake, alert X 3 Cardiovascular: regular rate and rhythm, no murmurs, rubs or gallops  Lungs: clear to auscultation bilaterally, without wheezing, rhonchi, or rales  Abdomen: soft, non-tender, non-distended; no palpable masses,  normoactive bowel sounds  Extremities: no cyanosis, edema         Meds:     Medications were reviewed:  Current Facility-Administered Medications   Medication Dose Route Frequency   . atorvastatin  10 mg Oral QHS   . famotidine  20 mg Oral Q12H SCH   . morphine  15 mg Oral BID   . pramipexole  0.25 mg Oral Q12H   . rivaroxaban  15 mg Oral Q12H SCH    Followed by   . [START ON 12/24/2015] rivaroxaban  20 mg Oral Daily with dinner   . traZODone  25 mg Oral QHS       Labs:     Labs (last 72 hours):      Recent Labs  Lab 12/03/15  0341 12/02/15  1520   WBC 5.81 9.90   HGB 11.6* 13.6   HEMATOCRIT 36.7* 40.9*   PLATELETS 353 404*         Recent Labs  Lab 12/02/15  1520   PT 13.7   PT INR 1.1   PTT 26      Recent  Labs  Lab 12/03/15  0341 12/02/15  1520   SODIUM 137 143   POTASSIUM 4.3 4.6   CHLORIDE 106 104   CO2 24 26   BUN 10.0 13.9   CREATININE 0.9 1.0   CALCIUM 8.8 9.7   ALBUMIN  --  3.2*   PROTEIN, TOTAL  --  6.1   BILIRUBIN, TOTAL  --  0.5   ALKALINE PHOSPHATASE  --  250*   ALT  --  19   AST (SGOT)  --  16   GLUCOSE 135* 207*                   Microbiology, reviewed and are significant for:  Microbiology Results     None          Imaging, reviewed and are significant for:  Ct Angio Chest (pe Study)    12/02/2015   Small bibasilar pulmonary embolism. Urgent findings were communicated to Dr. Cheral Almas at 18:00. Nelta Numbers, MD 12/02/2015 6:03 PM     US Venous Duplex Doppler Leg Bilateral    12/03/2015  No evidence of deep venous thrombosis in the lower extremities. Colonel Bald, MD 12/03/2015 10:37 AM         Signed by: Diona Foley, MD

## 2015-12-04 NOTE — Discharge Instructions (Signed)
[facility] - Discharge Instructions    I was hospitalized because: I had a rapid heart rate at home that was diagnosed to be due to a blood clot that travelled to my lungs and PARTIALLY blocked blood flow. I do have ADEQUATE blood flow to the lungs and I can walk around without getting short of breath, which is why my doctors thought it was safe to go home with anticoagulation pills.    The next medical provider I need to see is: My primary care doctor to discuss my anticoagulation and to discuss if I need to see a HEMATOLOGIST to discuss clotting disorders.    I need to seek medical attention if:  I have shortness of breath, chest pain or an episode of passing out.      If you have any urgent concerns or questions about your care that cannot be addressed by your primary care provider or the provider listed above, please contact the physician listed below:    Your attending physician (at discharge): Diona Foley, MD  Hospitalist, [facility]  [phone]           ------------------------------------------------------------------------------------------------------    Discharge Instructions for Pulmonary Embolism  A deep vein thrombosis (DVT) is a blood clot in a large vein deep in a leg, arm, or elsewhere in the body. The clot can separate from the vein, travel to the lungs and cut off blood flow. This is a pulmonary embolism (PE).Pulmonary embolism is very serious and may cause death.    Health care providers use the termvenous thromboembolism (VTE)to describe both DVT and PE.They use the term VTE because the 2 conditions are very closely related. And, because their prevention and treatment areclosely related.    Home care  Taking care of yourself is very important. To help prevent more blood clots from forming, follow your health care provider's instructions. You should:   Take your medicines exactlyas instructed. Don't skip doses.If you miss a dose, call your health  care provider and ask what you should do.   Have all lab tests as recommended. This is very important when you take medicines to prevent blood clots.   If your health care provider has instructed you to do so, wear elastic (compression stockings).   Get up and get moving.   While sitting for long periods of time, move your knees, ankles, feet, and toes.  Lifestyle changes  To help prevent problems with your heart and blood vessels, you should do the following:   If you smoke, think about quitting. Talk with your health care provider about medications and programs that can help.   Stay at a healthy weight.Talk to your health care provider about losing weight, if you are overweight   Try to exercise at least 30 minutes on most days. Before starting an exercise program,talk with your health care provider.   When traveling by car, make frequent stopstogetupand move around.   On long airplane rides,get up and move around when possible.If you can't get up, wiggle your toes, move your ankles and tighten your calves to keep your blood moving.  Follow-up care  Make a follow-up appointment as directed  Have your lab work done as directed.       [address] [employer] [address], [place], [place] [address]. All rights reserved. This information is not intended as a substitute for professional medical care. Always follow your healthcare professional's instructions.

## 2015-12-11 DIAGNOSIS — C4490 Unspecified malignant neoplasm of skin, unspecified: Secondary | ICD-10-CM

## 2015-12-11 HISTORY — DX: Unspecified malignant neoplasm of skin, unspecified: C44.90

## 2015-12-11 NOTE — Discharge Summary (Signed)
MEDICINE DISCHARGE SUMMARY    Date Time: 12/04/2015 8:36 PM  Patient Name: Matthew [name], Matthew [name]  Attending Physician: Diona Foley, MD  Primary Care Physician: [place]    Date of Admission: 12/02/2015  Date of Discharge: 12/04/2015    Discharge Diagnoses:     Principal Diagnosis (Diagnosis after study, that is chiefly responsible for admission to inpatient status): Pulmonary embolism, bilateral     Active Hospital Problems    Diagnosis POA   . Principal Problem: Pulmonary embolism, bilateral Yes   . History of lumbar laminectomy for spinal cord decompression Not Applicable   . Hypertension Yes     Chronic   . BPH (benign prostatic hypertrophy) Yes   . Degenerative lumbar spinal stenosis Yes      Resolved Hospital Problems    Diagnosis POA   No resolved problems to display.     Disposition:      Home with family    Pending Results, Recommendations & Instructions to providers after discharge:     1. Micro / Labs / Path pending:   2. Wound Care Instructions: N/A  3. Date of completion for anticoagulation: likely 6 months anticoagulation, to be determined after consultation with PCP/Hematologist at home    4. Other: N/A    Recent Labs - Last 2:      Ref. Range 12/03/2015 03:41 12/04/2015 03:45   WBC Latest Ref Range: 3.50-10.80 x10 3/uL 5.81 5.23   Hemoglobin Latest Ref Range: 13.0-17.0 g/dL 16.1 (L) 09.6 (L)   Hematocrit Latest Ref Range: 42.0-52.0 % 36.7 (L) 38.4 (L)   Platelet Count Latest Ref Range: 140-400 x10 3/uL 353 326   RBC Latest Ref Range: 4.70-6.00 x10 6/uL 3.89 (L) 4.09 (L)   MCV Latest Ref Range: 80.0-100.0 fL 94.3 93.9   MCH, POC Latest Ref Range: 28.0-32.0 pg 29.8 29.3   MCHC Latest Ref Range: 32.0-36.0 g/dL 04.5 (L) 40.9 (L)   RDW Latest Ref Range: 12-15 % 12 12   MPV Latest Ref Range: 9.4-12.3 fL 9.2 (L) 9.3 (L)      Ref. Range 12/02/2015 15:20 12/03/2015 03:41   Glucose Latest Ref Range: 70-100 mg/dL 811 (H) 914 (H)   BUN Latest Ref Range: 9.0-28.0 mg/dL 78.2 95.6      Creatinine Latest Ref Range: 0.5-1.5 mg/dL 1.0 0.9   Sodium Latest Ref Range: 135-146 mEq/L 143 137   Potassium Latest Ref Range: 3.5-5.3 mEq/L 4.6 4.3   Chloride Latest Ref Range: 100-111 mEq/L 104 106   CO2 Latest Ref Range: 21-30 mEq/L 26 24   Calcium Latest Ref Range: 7.9-10.2 mg/dL 9.7 8.8       Procedures/Radiology performed:   Radiology: all results from this admission  Xr Knee 1 Or 2 Views Right    11/14/2015  . Significant joint effusion with focal swelling. Findings are nonspecific but could reflect reactive effusion from an inflammatory arthropathy or occult trauma. 2. There is no evidence of acute bone injury 3. Mild nonspecific degenerative joint changes. The meniscal calcifications raising the concern for an underlying deposition disease Marty Heck, MD 11/14/2015 2:13 PM     Ct Angio Chest (pe Study)    12/02/2015   Small bibasilar pulmonary embolism. Urgent findings were communicated to Dr. Cheral Almas at 18:00. Nelta Numbers, MD 12/02/2015 6:03 PM     Mri Cervical Spine Wo Contrast    11/17/2015   Postoperative and advanced degenerative changes in the cervical spine. Terrilee Croak, MD 11/17/2015  2:14 PM     Xr Chest Ap Portable    11/14/2015  . Developing right basilar infiltrate Marty Heck, MD 11/14/2015 2:11 PM     US Venous Duplex Doppler Leg Right    11/14/2015   No evidence of deep venous thrombosis involving the right lower extremity.  Will Bonnet, MD 11/14/2015 10:02 AM     US Venous Duplex Doppler Leg Bilateral    12/03/2015  No evidence of deep venous thrombosis in the lower extremities. Colonel Bald, MD 12/03/2015 10:37 AM     US Venous Low Extrem Duplx Dopp Comp Bilat    11/18/2015   No deep venous thrombosis of the BILATERAL lower extremities. Jonette Pesa, MD 11/18/2015 7:37 PM     Xr Abdomen Portable    11/14/2015   Improving ileus. Annamary Carolin, MD 11/14/2015 8:42 AM     Xr Abdomen Portable    11/13/2015   Nonspecific bowel gas pattern favoring ileus. Continued radiographic  follow-up recommended. Elizebeth Koller, MD 11/13/2015 12:57 PM     Xr Abdomen Portable    11/12/2015  There is no evidence of a bowel obstruction. Findings are suggestive of an ileus. An early small bowel obstruction cannot be excluded. Continued follow-up as clinically warranted. Stephannie Peters, MD 11/12/2015 3:55 PM        Hospital Course:     Reason for admission/ HPI: Matthew [name] is a 75 y.o. male with spinal lumbar stenosis s/p L4-S1 fusion on 11/10/15, who was referred to the ED by home health nurse after she noted that he was tachycardic at home. He lives in [place]  Following his surgery, patient was d/c'd to [facility] rehab where he was from 12/9-12/16. He states that he was doing well at home, was weaning off his pain medication and working with PT. He denies any chest pain, shortness of breath or presyncope. He did note R knee fullness/pain a couple of weeks ago while at rehab, on 12/9 had bilateral lower extremity dopplers that were neg for dvt.     In the ED he received lovenox 1mg /kg and was admitted. He currently denies any chest pain, c/o lower back pain from a bumpy ride to the hospital and missed oral pain medication doses.      Hospital Course:   Additional workup after admission included LE dopplers, which revealed no DVT. Patient with normal blood pressure, no chest pain or hypoxia and trop 0.00. Was transitioned to xarelto on 12/24 without any bleeding events. Felt well overall on 12/24 but with continued tachycardia and some subjective shortness of breath with ambulation so he was kept overnight on 12/24 and felt more comfortable with activity on 12/25. Only change to his home medications was the addition of xarelto (1 month script given). He was instructed to follow up with his PCP and possibly a hematologist on return home mid Jan.       Discharge Day Exam:  Temp:afebrile over previous 24h  Heart Rate: 80s-90s  Resp Rate: [16-18] 16  BP: (110s-130s)/(60s-70s)  mmHg  Blood Glucose:  130s-200s  General: awake, alert X 3 Cardiovascular: regular rate and rhythm, no murmurs, rubs or gallops  Lungs: clear to auscultation bilaterally, without wheezing, rhonchi, or rales  Abdomen: soft, non-tender, non-distended; no palpable masses, normoactive bowel sounds  Extremities: no cyanosis, edema    Wounds/decutibus ulcers/stage:    Consultations:     Treatment Team:   Resident: Darcella Cheshire, MD  Dierdre Highman  Sherryll Burger, MD    Discharge Condition:     Good    Discharge Instructions & Follow Up Plan for Patient:     Diet: Cardiac    Activity/Weight Bearing Status: As tolerated      Patient was instructed to follow up with:     Follow-up Information     Follow up with Pcp, [facility], MD .    Contact information:    [address] [place]  [address] [place] [address]  [phone]            Discharge Code Status: FULL    Patient Emergency Contact: Lorne Skeens [phone]    Complete instructions and follow up are in the patient's After Visit Summary    Minutes spent coordinating discharge and reviewing discharge plan: 20 minutes    Discharge Medications:        Discharge Medication List      Taking          ALPRAZolam 0.5 MG tablet   Dose:  0.5 mg   Commonly known as:  XANAX   Take 1 tablet (0.5 mg total) by mouth 2 (two) times daily as needed for Anxiety.       amlodipine-benazepril 10-20 MG per capsule   What changed:  See the new instructions.   Commonly known as:  LOTREL   Take 1 capsule of your 5mg -10mg  dose capsule. This was prescribed by your primacy care provider.       atorvastatin 10 MG tablet   Dose:  10 mg   Commonly known as:  LIPITOR   Take 1 tablet (10 mg total) by mouth nightly.       BELSOMRA 20 MG Tabs   Dose:  20 mg   Generic drug:  Suvorexant   Take 20 mg by mouth nightly.       cyclobenzaprine 5 MG tablet   Dose:  5 mg   Commonly known as:  FLEXERIL   Take 1 tablet (5 mg total) by mouth 3 (three) times daily as needed for Muscle spasms.       docusate sodium 100 MG capsule   Dose:   100 mg   Commonly known as:  COLACE   Take 100 mg by mouth 2 (two) times daily as needed for Constipation.       HYDROmorphone 2 MG tablet   Dose:  2 mg   What changed:    - when to take this  - reasons to take this   Commonly known as:  DILAUDID   Take 1 tablet (2 mg total) by mouth every 6 (six) hours as needed for Pain.       morphine 15 MG Tbcr 12 hr tablet   Dose:  15 mg   What changed:  when to take this   Commonly known as:  MS CONTIN   Take 1 tablet (15 mg total) by mouth 2 (two) times daily.       pantoprazole 40 MG tablet   Dose:  40 mg   Commonly known as:  PROTONIX   Take 1 tablet (40 mg total) by mouth daily.       pramipexole 0.25 MG tablet   Dose:  0.25 mg   Commonly known as:  MIRAPEX   Take 1 tablet (0.25 mg total) by mouth every 12 (twelve) hours.       * XARELTO 15 MG Tabs   Dose:  15 mg   Generic drug:  rivaroxaban  Take 1 tablet (15 mg total) by mouth every 12 (twelve) hours.       * XARELTO 20 MG Tabs   Dose:  20 mg   Start taking on:  12/24/2015   Generic drug:  rivaroxaban   Take 1 tablet (20 mg total) by mouth daily with dinner. One month script given. To be refilled by PCP for total 6 months treatment       * Notice:  This list has 2 medication(s) that are the same as other medications prescribed for you. Read the directions carefully, and ask your doctor or other care provider to review them with you.          Immunizations provided: None        [facility]  Department of Medicine  P: [phone]  F: [phone]    Signed by: Diona Foley, MD

## 2016-01-06 NOTE — Rehab Progress Note ([facility]) (Signed)
Matthew [name]  MRN: [id number]  Account: [id number]  Session Start: 11/22/2015 12:00:00 AM  Session Stop: 11/22/2015 12:00:00 AM    Case Management  Inpatient Rehabilitation Team Conference    Conference Date/Time: 11/22/2015 2:00:00 PM    Demographics            Age: 59Y            Gender: Male    Admission Date: 11/18/2015 2:21:00 PM  Diagnosis: Degenerative lumbar spinal stenosis, Spondylolisthesis of lumbar  region, s/p fusion L4-S1  Comorbidities:    VITAL SIGNS  Blood Pressure: 107/69 mmHg  Temperature:  degrees  Pulse: 91 beats per minute  Respirations: 16 breaths per minute  Pain: 4/10    WEIGHT and NUTRITION  Admission Weight: 158.5 pounds; Current Weight: 158.5pounds  Weight Change since Admit: Patient has had no weight change since admission.  Food Consistency: Regular  Liquid Consistency:Thin    Plan Of Care  Anticipated Discharge Date/Estimated Length of Stay: 11/25/2015  Anticipated Discharge Destination: Community discharge with assistance  Discharge Plan : Home  Medical Necessity Expected Level Rationale: daily MD e/T/m for VS, labs, neuro  status....  Intensity and Duration: an average of 3 hours/5 days per week  Medical Supervision and 24 Hour Rehab Nursing: x  Physical Therapy: x  PT Intensity/Duration: 60-129min/day5-6days/wk  Occupational Therapy: x  OT Intensity/Duration: 60-144min/day5-6days/wk  Speech and Language Therapy: x  Therapeutic Recreation: x  Psychology: x  Registered Dietician: x  Case Management: Adela Lank, CM  Nurse: Veryl Speak, RN  Nutrition: Lurline Hare, RD  Occupational Therapy: Retia Passe, OT  Physical Therapy: Delsa Grana, PT  Physician: Candie Echevaria, MD    The following is a list of patient problems that have been identified by the  interdisciplinary team:    Problem: Impaired Mobility  Mobility Status Update: Pt requirs CGA for bed mobility, min A for transfers and  min A to walk 54ft with BPFW  Team Identified Barrier to Discharge:  Yes  Interventions:  Transfer training: Active  Gait training: Active  Mobility: Primary Team Goal/Status: Pt will perform majority of mobility at a  mod I level upon discharge with most appropriate assisstive device. / Active    Problem: Impaired Pain Management  Pain Management Status Update: C/O of discomfort level 6/10. Ask for PRN  medication no more than 2x per 12 hours shift.  Team Identified Barrier to Discharge: Yes  Interventions:  Medications as ordered: Active  Distraction and/or relaxation: Active  Positioning: Active  Other Pain Management Intervention 1 - Text: education  Other Pain Management Intervention 1 - Status: Active  Pain Mgmt: Primary Team Goal/Status: Patient's back discomfort will not impact  functional tasks 100% of the time / Active    Problem: Impaired Self-care Mgmt/ADL/IADL  Self Care/ADL/IADL Status Update: Pt is SBA with toileting and toilet transfer  with RW,  Supervision for UB/LB ADL with cues to maitain back precautions.  Limited by pain.  Team Identified Barrier to Discharge: Yes  Interventions:  Adaptive equipment training: Active  Compensatory strategies: Active  Encourage patient to participate in activities of daily living: Active  Self Care: Primary Team Goal/Status: Pt will be mod I for ADL and fxl transfer  with LRD / Active    Problem: Impaired Skin/Wound Mgmt  Skin Wound Status Update: Back Incision mgt/dressing changing daily with  patient's dependency by nsg  Team Identified Barrier to Discharge: Yes  Interventions:  Turning schedule: Active  Other Skin Wound Intervention 1 -  Text: eduction/training  Other Skin Wound Intervention 1 - Status: Active  Skin Wound: Primary Team Goal/Status: Patient and caregiver will be able  perrform demo on back mgt and incisional care without diff. prior to discharge /  Active    Fall Risk Assessment: TOTAL SCORE >10: High Risk    Functional Measures  Eating: 7  Grooming: 5  Bathing: 5  Upper Body Dressing: 5  Lower Body Dressing:  5  Toileting: 5  Bladder Level of Assistance: 5  Bowel Level of Assistance: 6  Bed/Chair/Wheelchair Transfer: 4  Toilet Transfer: 5  Tub Transfer:  Engineer, structural: 4  Wheelchair: 0  Walk: 1  Stairs: 1  Comprehension: 7  Expression: 7  Social Interaction: 7  Problem Solving: 7  Memory: 7  Bladder Frequency of Accidents - Admission Period: IRF Score = 6 - No accidents;  uses device  Bowel Frequency of Accidents - Admission Period: IRF Score = 6 - No accidents;  uses device    KEY TO FIM LEVELS (General)  7 The patient completes the task or skill independently in a timely manner.  6 The patient completes the task or skill without a helper but uses an assistive  device, has mild difficulty or needs extra time.  5 The patient performs 100% of the task with supervision.  4 The patient performs 75-90% of the effort to complete the task or skill.  3 The patient performs 50-74% of the effort to complete the task or skill.  2 The patient performs 25-49% of the effort to complete the task or skill.  1 The patient performs 0-24% of the effort to complete the task or skill.  0 The task or skill does not occur because it is unsafe, contraindicated or the  patient refused.    Walk/Wheelchair Functional Measures scoring parameters:  7 Patient walks/propels a minimum of 150 feet independently.  6 Patient walks/propels a minimum of 150 feet without a helper but with a device  or safety concerns.  5 Patient walks/propels 50 feet independently OR walks a minimum of 150 feet  with supervision.  4 Patient performs 75-90% of the effort to walk/propel a minimum of 150 feet.  3 Patient performs 50-74% of the effort to walk/propel a minimum of 150 feet.  2 Patient walks/propels a minimum of 50 feet with any amount of assistance from  a helper.  1 Patient walks/propels less than 50 feet with any amount of assistance or needs  two helpers for any distance.    Stair Functional Measures scoring parameters:  7 Patient goes up and down 12 to 14  stairs independently.  6 Patient goes up and down 12 to 14 stairs with a device but without a helper.  5 Patient goes up and down 4 to 6 stairs independently OR 12 to 14 stairs with  supervision.  4 Patient performs 75-90% of the effort to go up and down 12 to 14 stairs.  3 Patient performs 50-74% of the effort to go up and down 12 to 14 stairs.  2 Patient goes up and down 4 to 6 stairs with any amount of assistance from a  helper.  1 Patient goes up and down less than 4 to 6 stairs, needs two helpers for any  number of stairs or uses a stair lift.    Comments:    Signed by: Adela Lank, MSW 11/22/2015 3:06:00 PM    Physician CoSigned By: Candie Echevaria 01/06/2016 16:51:24

## 2016-10-31 DIAGNOSIS — M545 Low back pain, unspecified: Secondary | ICD-10-CM | POA: Insufficient documentation

## 2016-10-31 DIAGNOSIS — M79604 Pain in right leg: Secondary | ICD-10-CM | POA: Insufficient documentation

## 2017-02-20 ENCOUNTER — Other Ambulatory Visit: Payer: Self-pay | Admitting: Neurosurgery

## 2017-02-20 DIAGNOSIS — M4316 Spondylolisthesis, lumbar region: Secondary | ICD-10-CM

## 2017-03-05 ENCOUNTER — Ambulatory Visit
Admission: RE | Admit: 2017-03-05 | Discharge: 2017-03-05 | Disposition: A | Discharge status: Home / Self Care | Payer: Medicare Other | Source: Ambulatory Visit | Attending: Neurosurgery | Admitting: Neurosurgery

## 2017-03-05 VITALS — BP 113/50 | HR 80

## 2017-03-05 DIAGNOSIS — M4316 Spondylolisthesis, lumbar region: Secondary | ICD-10-CM

## 2017-03-05 DIAGNOSIS — M79604 Pain in right leg: Secondary | ICD-10-CM

## 2017-03-05 DIAGNOSIS — M545 Low back pain, unspecified: Secondary | ICD-10-CM

## 2017-03-05 DIAGNOSIS — M5416 Radiculopathy, lumbar region: Secondary | ICD-10-CM

## 2017-03-05 DIAGNOSIS — M48061 Spinal stenosis, lumbar region without neurogenic claudication: Secondary | ICD-10-CM

## 2017-03-05 DIAGNOSIS — M47817 Spondylosis without myelopathy or radiculopathy, lumbosacral region: Secondary | ICD-10-CM

## 2017-03-05 DIAGNOSIS — Z9889 Other specified postprocedural states: Secondary | ICD-10-CM

## 2017-03-05 DIAGNOSIS — M47816 Spondylosis without myelopathy or radiculopathy, lumbar region: Secondary | ICD-10-CM

## 2017-03-05 MED ORDER — IOPAMIDOL (ISOVUE-M 300) INJECTION 61%
10.0000 mL | Freq: Once | INTRAMUSCULAR | Status: AC | PRN
  Administered 2017-03-05: 10 mL via INTRATHECAL

## 2017-03-05 MED ORDER — MEPERIDINE HCL 100 MG/ML IJ SOLN
75.0000 mg | Freq: Once | INTRAMUSCULAR | Status: AC
  Administered 2017-03-05: 75 mg via INTRAMUSCULAR

## 2017-03-05 MED ORDER — ONDANSETRON HCL 4 MG/2ML IJ SOLN
4.0000 mg | Freq: Once | INTRAMUSCULAR | Status: AC
  Administered 2017-03-05: 4 mg via INTRAMUSCULAR

## 2017-03-05 MED ORDER — ONDANSETRON HCL 4 MG/2ML IJ SOLN: 4.0000 mg | Freq: Four times a day (QID) | INTRAMUSCULAR | Status: DC | PRN

## 2017-03-05 NOTE — Progress Notes (Signed)
Patient states he has been off Xarelto for a day and off Phenergan/Promethazine and Trazdone for at least the past two days.  Donell SievertJeanne Asta Corbridge, RN

## 2017-03-05 NOTE — Discharge Instructions (Signed)
Myelogram Discharge Instructions  1. Go home and rest quietly for the next 24 hours.  It is important to lie flat for the next 24 hours.  Get up only to go to the restroom.  You may lie in the bed or on a couch on your back, your stomach, your left side or your right side.  You may have one pillow under your head.  You may have pillows between your knees while you are on your side or under your knees while you are on your back.  2. DO NOT drive today.  Recline the seat as far back as it will go, while still wearing your seat belt, on the way home.  3. You may get up to go to the bathroom as needed.  You may sit up for 10 minutes to eat.  You may resume your normal diet and medications unless otherwise indicated.  Drink lots of extra fluids today and tomorrow.  4. The incidence of headache, nausea, or vomiting is about 5% (one in 20 patients).  If you develop a headache, lie flat and drink plenty of fluids until the headache goes away.  Caffeinated beverages may be helpful.  If you develop severe nausea and vomiting or a headache that does not go away with flat bed rest, call [phone].  5. You may resume normal activities after your 24 hours of bed rest is over; however, do not exert yourself strongly or do any heavy lifting tomorrow. If when you get up you have a headache when standing, go back to bed and force fluids for another 24 hours.  6. Call your physician for a follow-up appointment.  The results of your myelogram will be sent directly to your physician by the following day.  7. If you have any questions or if complications develop after you arrive home, please call [phone].  Discharge instructions have been explained to the patient.  The patient, or the person responsible for the patient, fully understands these instructions.       MAY RESUME XARELTO TODAY.   May resume Phenergan and Trazodone on March 06, 2017.

## 2017-07-15 ENCOUNTER — Encounter: Payer: Medicare Other | Attending: Psychology | Admitting: Psychology

## 2017-07-15 DIAGNOSIS — G479 Sleep disorder, unspecified: Secondary | ICD-10-CM | POA: Diagnosis not present

## 2017-07-15 DIAGNOSIS — M5416 Radiculopathy, lumbar region: Secondary | ICD-10-CM | POA: Insufficient documentation

## 2017-07-15 DIAGNOSIS — M5417 Radiculopathy, lumbosacral region: Secondary | ICD-10-CM

## 2017-07-15 DIAGNOSIS — M48061 Spinal stenosis, lumbar region without neurogenic claudication: Secondary | ICD-10-CM | POA: Diagnosis not present

## 2017-07-15 DIAGNOSIS — M545 Low back pain: Secondary | ICD-10-CM | POA: Insufficient documentation

## 2017-07-15 DIAGNOSIS — G894 Chronic pain syndrome: Secondary | ICD-10-CM | POA: Insufficient documentation

## 2017-08-01 ENCOUNTER — Encounter (HOSPITAL_BASED_OUTPATIENT_CLINIC_OR_DEPARTMENT_OTHER): Payer: Medicare Other | Admitting: Psychology

## 2017-08-01 DIAGNOSIS — G894 Chronic pain syndrome: Secondary | ICD-10-CM

## 2017-08-01 DIAGNOSIS — M48061 Spinal stenosis, lumbar region without neurogenic claudication: Secondary | ICD-10-CM

## 2017-08-01 DIAGNOSIS — M5417 Radiculopathy, lumbosacral region: Secondary | ICD-10-CM

## 2017-08-01 DIAGNOSIS — M5416 Radiculopathy, lumbar region: Secondary | ICD-10-CM

## 2017-08-06 NOTE — Progress Notes (Signed)
Neuropsychological Consultation   Patient:   [place]   DOB:   Jan 06, 1941  MR Number:  [id number]  Location:  [facility]ILIT[facility] [address] [place] [id number] [place] [address] Dept: [phone]           Date of Service:   07/15/2017  Start Time:   2 PM End Time:   3 PM  Provider/Observer:  Arley Phenix, Psy.D.       Clinical Neuropsychologist       Billing Code/Service: [phone] 4 Units  Chief Complaint:    The patient presents today reporting that he experiences severe lower back pain. The purpose for this appointment was primarily related to conducting a psychological evaluation as part of the standard procedure for spinal cord stimulator evaluation and possible trialing and implantation. The patient describes ongoing issues with spinal stenosis and low back pain.  The patient denies any significant issues related to depression or anxiety or other pre-existing psychiatric condition. The patient also denies any significant psychosocial variables that are playing any significant deleterious effect on his current symptomatology.  Reason for Service:  [place] is a 76 year old male who was referred by Dr. Ollen Bowl for psychological evaluation as part of the pulmonary workup for consideration of spinal cord stimulator trialing and possible implantation. The patient describes severe chronic pain due to spinal stenosis. He reports it is very difficult for him to go to sleep but does report that once he falls asleep he will sleep for approximately 8 hours. The patient reports that he can't stand for very long and that his legs will calls a lot of pain for him. The patient has had numerous MRI and CT scans. The patient has had prior back surgery where hardware was placed between L4 region L5 and S1. The patient does deny any history of depression or anxiety or bipolar disorder or  other significant psychiatric conditions. The patient describes it as present symptoms create an inability for him to stand, sit or walk without severe pain. He reports that these difficulties have been going on for the past 3 years. He reports he spends most of his time in bed or in a recliner and that he cannot travel.  Current Status:  The patient reports ongoing severe low back pain resulting from spinal stenosis.  Behavioral Observation: [place]  presents as a 76 y.o.-year-old Right Caucasian Male who appeared his stated age. his dress was Appropriate and he was Well Groomed and his manners were Appropriate to the situation.  his participation was indicative of Appropriate and Attentive behaviors.  There were physical disabilities noted.  he displayed an appropriate level of cooperation and motivation.     Interactions:    Active Appropriate and Attentive  Attention:   within normal limits and attention span and concentration were age appropriate  Memory:   within normal limits; recent and remote memory intact  Visuo-spatial:  within normal limits  Speech (Volume):  normal  Speech:   normal;   Thought Process:  Coherent and Relevant  Though Content:  WNL; not suicidal  Orientation:   person, place, time/date and situation  Judgment:   Good  Planning:   Good  Affect:    Appropriate  Mood:    There were no indications of any disruptive mood state such as depression, anxiety or other issues such as excessive anger or frustration.  Insight:   Good  Intelligence:  normal  Marital Status/Living: The patient was born and raised [place]. He is divorced. The patient has a 58 year old daughter and a 41 year old daughter. He currently lives alone.  Current Employment: The patient is currently working in [employer] but is essentially retired.  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  Substance Use:  No concerns of substance abuse are reported.    Education:   Control and instrumentation engineer History:  No past medical history on file.           Abuse/Trauma History: The patient denies any history of abuse or trauma.  Psychiatric History:  The patient denies any prior psychiatric history.  Family Med/Psych History: No family history on file.  Risk of Suicide/Violence: virtually non-existent   Impression/DX:  The patient presents today reporting that he experiences severe lower back pain. The purpose for this appointment was primarily related to conducting a psychological evaluation as part of the standard procedure for spinal cord stimulator evaluation and possible trialing and implantation. The patient describes ongoing issues with spinal stenosis and low back pain.  The patient denies any significant issues related to depression or anxiety or other pre-existing psychiatric condition. The patient also denies any significant psychosocial variables that are playing any significant deleterious effect on his current symptomatology.  Disposition/Plan:  The patient will complete the [place] multiphasic personality inventory-2 as well as the pain patient profile.  Diagnosis:    Lumbar back pain with radiculopathy affecting right lower extremity  Chronic pain syndrome         Electronically Signed   _______________________ Arley Phenix, Psy.D.

## 2017-08-06 NOTE — Progress Notes (Signed)
Patient:  LABAN OROURKE   DOB: Aug 26, 1941  MR Number: [id number]  Location: [facility] FOR [facility] AND [facility] MEDICINE [facility] PHYSICAL MEDICINE AND REHABILITATION [address], [place] [address] [id number] [place] [address] Dept: [phone]  Start: 1 PM End: 2 PM  Provider/Observer:     Hershal Coria PSYD  Chief Complaint:      Chief Complaint  Patient presents with  . Pain  . Sleeping Problem    Reason For Service:     Dorsel Flinn is a 76 year old male who was referred by Dr. Ollen Bowl for psychological evaluation as part of the pulmonary workup for consideration of spinal cord stimulator trialing and possible implantation. The patient describes severe chronic pain due to spinal stenosis. He reports it is very difficult for him to go to sleep but does report that once he falls asleep he will sleep for approximately 8 hours. The patient reports that he can't stand for very long and that his legs will calls a lot of pain for him. The patient has had numerous MRI and CT scans. The patient has had prior back surgery where hardware was placed between L4 region L5 and S1. The patient does deny any history of depression or anxiety or bipolar disorder or other significant psychiatric conditions. The patient describes it as present symptoms create an inability for him to stand, sit or walk without severe pain. He reports that these difficulties have been going on for the past 3 years. He reports he spends most of his time in bed or in a recliner and that he cannot travel.  Testing Administered:  The patient completed the [place] multiphasic personality inventory-2 as well as the pain patient profile.  Participation Level:   Active  Participation Quality:  Appropriate and Attentive      Behavioral Observation:  Well Groomed, Alert, and Appropriate.   Test Results:   Initially, the patient completed the MMPI-2. The resulting validity scales suggest that he  approached this task in a straightforward and honest manner with out of making attempts to either exaggerate or minimize his current symptomatology.  The resulting basic clinical scales show significant elevations relative to normative population for both vague as well as specific physical and health concerns. These are the only 2 clinical elevations in his profile. The patient did not have clinically significant elevations for measures of depression, frustration and anger, anxiety, difficulties with mania or lethargy, or indications of any psychotic or hallucinatory types of symptoms. Further analysis utilizing content scales do show a continued pattern of singular elevations with regard to health concerns.  Supplementary scales show that none of the clinical scales are in the significant range. There is no elevation on measures such as those that predicted on her ability to substance abuse and alcohol abuse, no indication of any pre-existing her ongoing issues with PTSD and the patient does appear to have good social skills and social adaptation. There is an elevational measures related to physical malfunction which is consistent with patient's describes symptomatology as well as an elevation with regard to difficulties with fatigue and malaise. No other clinical scales are in the clinically significant range.  The patient then completed the pain patient profile (P3). This measure is designed to make some adjustments to issues associated with chronic pain that may be found on the MMPI-2. The P3 measure is normal on both a normative community-based sample as well as a pain patient population without significant psychiatric symptoms or conditions. The patient  showed good validity pattern on this measure and therefore does appear to be a fair and valid assessment.  The patient showed an elevation on somatic complaints relative to a community-based normative sample but was actually below those physical  complaints typically found with a chronic pain patient population. The patient showed no elevations in measures of anxiety or depression for either the community-based normative sample or the pain patient population.  Summary of Results:   The results of the current psychological evaluation consisting of both a 1 hour face-to-face clinical interview as well as review of medical records in conjunction with objective assessment utilizing the MMPI-2 as well as the P3 inventories suggest that there are no significant psychological or psychosocial variables that would complicate or hinder an appropriate trialing phase or possible implantation for spinal cord stimulator. The patient is only elevations had to do with specific descriptions of his significant pain issues as well as some issues related to some difficulties with sleep problems. There were no patterns of significant psychiatric illness.  Impression/Diagnosis:   Overall, the results of the current objective psychological evaluation do suggest that the patient is a very good candidate for spinal cord stimulator trialing and possible implantation. There are no indications of significant anxiety or depression or other psychiatric condition such as bipolar disorder. The patient had good mental status and does show good memory and cognitive functioning especially when considering his current age. There were no indications of any processes related to dementia or other psychiatric or neurological disorders. The patient does have significant chronic pain which may be effectively treated with spinal cord stimulator.  Diagnosis:    Axis I: Chronic pain syndrome  Lumbar back pain with radiculopathy affecting right lower extremity  Spinal stenosis of lumbar region without neurogenic claudication   Arley Phenix, Psy.D. Neuropsychologist

## 2017-08-13 ENCOUNTER — Encounter: Payer: Medicare Other | Admitting: Psychology

## 2017-11-05 ENCOUNTER — Ambulatory Visit (INDEPENDENT_AMBULATORY_CARE_PROVIDER_SITE_OTHER): Payer: Medicare Other | Admitting: "Endocrinology

## 2017-11-05 ENCOUNTER — Encounter: Payer: Self-pay | Admitting: "Endocrinology

## 2017-11-05 VITALS — BP 165/94 | HR 64 | Wt 144.0 lb

## 2017-11-05 DIAGNOSIS — E291 Testicular hypofunction: Secondary | ICD-10-CM | POA: Diagnosis not present
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
## 2017-12-10 DIAGNOSIS — K449 Diaphragmatic hernia without obstruction or gangrene: Secondary | ICD-10-CM

## 2017-12-10 HISTORY — DX: Diaphragmatic hernia without obstruction or gangrene: K44.9

## 2018-01-14 ENCOUNTER — Other Ambulatory Visit: Payer: Self-pay | Admitting: Physician Assistant

## 2018-01-14 DIAGNOSIS — S32010A Wedge compression fracture of first lumbar vertebra, initial encounter for closed fracture: Secondary | ICD-10-CM

## 2018-01-18 ENCOUNTER — Ambulatory Visit
Admission: RE | Admit: 2018-01-18 | Discharge: 2018-01-18 | Disposition: A | Discharge status: Home / Self Care | Payer: Medicare Other | Source: Ambulatory Visit | Attending: Physician Assistant | Admitting: Physician Assistant

## 2018-01-18 DIAGNOSIS — S32010A Wedge compression fracture of first lumbar vertebra, initial encounter for closed fracture: Secondary | ICD-10-CM

## 2018-02-27 ENCOUNTER — Other Ambulatory Visit (INDEPENDENT_AMBULATORY_CARE_PROVIDER_SITE_OTHER): Payer: Self-pay | Admitting: Family Medicine

## 2018-03-12 ENCOUNTER — Other Ambulatory Visit (INDEPENDENT_AMBULATORY_CARE_PROVIDER_SITE_OTHER): Payer: Self-pay | Admitting: Family Medicine

## 2018-03-13 ENCOUNTER — Encounter (FREE_STANDING_LABORATORY_FACILITY): Payer: Medicare Other

## 2018-03-13 ENCOUNTER — Ambulatory Visit: Payer: Self-pay

## 2018-03-13 DIAGNOSIS — R634 Abnormal weight loss: Secondary | ICD-10-CM

## 2018-03-13 DIAGNOSIS — K625 Hemorrhage of anus and rectum: Secondary | ICD-10-CM

## 2018-03-13 DIAGNOSIS — K319 Disease of stomach and duodenum, unspecified: Secondary | ICD-10-CM

## 2018-03-15 LAB — LAB USE ONLY - HISTORICAL SURGICAL PATHOLOGY

## 2018-05-06 ENCOUNTER — Telehealth: Payer: Medicare Other

## 2018-05-06 NOTE — Pre-Procedure Instructions (Signed)
   All testing per surgeon   PMD preop eval sched 05/08/09 - labs, MRSA/MSSA, CXR, EKG, UA - per surgeon orders   Pt hospitalized x 4 days in [place] 04/10/18 for pneumonia/IV abx - resolved - states PMD appt for clearance   Spine Video link provided/reviewed

## 2018-05-09 NOTE — Pre-Procedure Instructions (Addendum)
Called PCP office, left message with call back number on nurse line, for preop exam/testing.  Addendum 12:10  Reviewed preop exam note, cxr.  Called PCP back, requested statement of med clearance, labs & ekg.  Will be faxed to [facility].

## 2018-05-12 NOTE — Discharge Instr - AVS First Page (Signed)
Reason for your Hospital Admission:    Lumbar Spine Hardware Removal       Instructions for after your discharge:      Back in Action Spine Surgery booklet to be provided and reviewed if not previously completed and documented.      Mixing alcohol and pain medications can cause dizziness and slow your breathing.    Don't drink alcoholic beverages while taking pain medications.           Edman Circle. Marjo Bicker, MD  Orthopaedic Spine Surgery    Lumbar Fusion Surgery  Discharge Instructions    WOUND CARE:    Your wound will be closed with absorbable stitches and covered with steri-strips, gauze and tape. Change the gauze dressing daily for seven days after surgery. Thereafter apply dressing only as needed.  If you notice any swelling, redness, or excessive drainage, call our office at [phone]. Sponge bath for the first five days after surgery .  You may front  shower on the 6th day after surgery. Use caution to avoid letting the water pound on your incision.  One week after surgery you may take a full unrestricted shower.   Do not soak your incision in the bath tub, hot tub, or pool until 4 weeks after surgery. You may purchase dressing supplies at [employer] located in the west corridor of the [employer].    ACTIVITY:    While in the hospital, you will be seen by occupational and physical therapists who will help you to sit in a chair and start walking and learn to climb stairs. After two to three days, you will be discharged to go home. You are encouraged to walk and change positions frequently. Some patients may need to have a physical therapist come to their home for a few visits. Some patients may need a walker and/or a bedside commode for a short time after arriving home. Do not bend, twist, or lift anything greater than a gallon of milk until re-evaluation. Do not use anti-inflammatory medications for two months after surgery. You may drive when you feel comfortable and are no longer using narcotic  medications. Your doctor will discuss with you when you will be able to return to work.    FOLLOW UP:    You will be given a post-op appointment at two weeks after surgery at which time X-rays will be taken. You will start physical therapy at 3 months after surgery. Should you have further questions or concerns at any point during your recovery, please do not hesitate to contact our office at [phone].    [address]  [address]  [place], [place]    [address]  Phone: [phone]  Fax:[phone]

## 2018-05-20 ENCOUNTER — Inpatient Hospital Stay
Admission: RE | Admit: 2018-05-20 | Discharge: 2018-05-21 | DRG: 495 | Disposition: A | Discharge status: Home / Self Care | Payer: Medicare Other | Source: Ambulatory Visit | Attending: Orthopaedic Surgery | Admitting: Orthopaedic Surgery

## 2018-05-20 ENCOUNTER — Inpatient Hospital Stay: Payer: Medicare Other | Admitting: Anesthesiology

## 2018-05-20 ENCOUNTER — Inpatient Hospital Stay: Payer: Medicare Other

## 2018-05-20 ENCOUNTER — Encounter
Admission: RE | Disposition: A | Discharge status: Home / Self Care | Payer: Self-pay | Source: Ambulatory Visit | Attending: Orthopaedic Surgery

## 2018-05-20 DIAGNOSIS — T8484XA Pain due to internal orthopedic prosthetic devices, implants and grafts, initial encounter: Principal | ICD-10-CM | POA: Diagnosis present

## 2018-05-20 DIAGNOSIS — M5442 Lumbago with sciatica, left side: Secondary | ICD-10-CM | POA: Diagnosis present

## 2018-05-20 DIAGNOSIS — Z681 Body mass index (BMI) 19 or less, adult: Secondary | ICD-10-CM

## 2018-05-20 DIAGNOSIS — F419 Anxiety disorder, unspecified: Secondary | ICD-10-CM | POA: Diagnosis present

## 2018-05-20 DIAGNOSIS — N400 Enlarged prostate without lower urinary tract symptoms: Secondary | ICD-10-CM | POA: Diagnosis present

## 2018-05-20 DIAGNOSIS — K219 Gastro-esophageal reflux disease without esophagitis: Secondary | ICD-10-CM | POA: Diagnosis present

## 2018-05-20 DIAGNOSIS — G8929 Other chronic pain: Secondary | ICD-10-CM | POA: Diagnosis present

## 2018-05-20 DIAGNOSIS — E785 Hyperlipidemia, unspecified: Secondary | ICD-10-CM | POA: Diagnosis present

## 2018-05-20 DIAGNOSIS — E43 Unspecified severe protein-calorie malnutrition: Secondary | ICD-10-CM | POA: Diagnosis present

## 2018-05-20 DIAGNOSIS — Z86711 Personal history of pulmonary embolism: Secondary | ICD-10-CM

## 2018-05-20 DIAGNOSIS — M5441 Lumbago with sciatica, right side: Secondary | ICD-10-CM | POA: Diagnosis present

## 2018-05-20 DIAGNOSIS — I10 Essential (primary) hypertension: Secondary | ICD-10-CM | POA: Diagnosis present

## 2018-05-20 SURGERY — REMOVAL, SPINE HARDWARE, LUMBAR, POSTERIOR
Anesthesia: Anesthesia General | Site: Back | Wound class: Clean

## 2018-05-20 MED ORDER — DIPHENOXYLATE-ATROPINE 2.5-0.025 MG PO TABS
ORAL_TABLET | ORAL | Status: AC
Start: 2018-05-20 — End: ?
  Filled 2018-05-20: qty 1

## 2018-05-20 MED ORDER — ACETAMINOPHEN 10 MG/ML IV SOLN
1000.00 mg | Freq: Once | INTRAVENOUS | Status: AC
Start: 2018-05-20 — End: 2018-05-20
  Administered 2018-05-20: 21:00:00 1000 mg via INTRAVENOUS
  Filled 2018-05-20: qty 100

## 2018-05-20 MED ORDER — PROPOFOL 10 MG/ML IV EMUL (WRAP)
INTRAVENOUS | Status: DC | PRN
Start: 2018-05-20 — End: 2018-05-20
  Administered 2018-05-20: 200 mg via INTRAVENOUS

## 2018-05-20 MED ORDER — METOCLOPRAMIDE HCL 5 MG/ML IJ SOLN
10.0000 mg | Freq: Once | INTRAMUSCULAR | Status: DC | PRN
Start: 2018-05-20 — End: 2018-05-21

## 2018-05-20 MED ORDER — GELATIN ABSORBABLE 100 EX MISC
CUTANEOUS | Status: DC | PRN
Start: 2018-05-20 — End: 2018-05-20
  Administered 2018-05-20: 1 via TOPICAL

## 2018-05-20 MED ORDER — FENTANYL CITRATE (PF) 50 MCG/ML IJ SOLN (WRAP)
25.0000 ug | INTRAMUSCULAR | Status: AC | PRN
Start: 2018-05-20 — End: 2018-05-20
  Administered 2018-05-20 (×3): 25 ug via INTRAVENOUS

## 2018-05-20 MED ORDER — SUCCINYLCHOLINE CHLORIDE 20 MG/ML IJ SOLN
INTRAMUSCULAR | Status: DC | PRN
Start: 2018-05-20 — End: 2018-05-20
  Administered 2018-05-20: 100 mg via INTRAVENOUS

## 2018-05-20 MED ORDER — SODIUM CHLORIDE BACTERIOSTATIC 0.9 % IJ SOLN
INTRAMUSCULAR | Status: DC | PRN
Start: 2018-05-20 — End: 2018-05-20
  Administered 2018-05-20: 10 mL

## 2018-05-20 MED ORDER — SODIUM CHLORIDE 0.9 % IV MBP
1.0000 g | Freq: Three times a day (TID) | INTRAVENOUS | Status: DC
Start: 2018-05-20 — End: 2018-05-21

## 2018-05-20 MED ORDER — SODIUM CHLORIDE 0.9 % IR SOLN
Status: DC | PRN
Start: 2018-05-20 — End: 2018-05-20
  Administered 2018-05-20: 1000 mL

## 2018-05-20 MED ORDER — HYDROMORPHONE HCL 1 MG/ML IJ SOLN
INTRAMUSCULAR | Status: AC
Start: 2018-05-20 — End: 2018-05-20
  Administered 2018-05-20: 23:00:00 0.5 mg via INTRAVENOUS
  Filled 2018-05-20: qty 1

## 2018-05-20 MED ORDER — EPHEDRINE SULFATE 50 MG/ML IJ/IV SOLN (WRAP)
Status: AC
Start: 2018-05-20 — End: ?
  Filled 2018-05-20: qty 1

## 2018-05-20 MED ORDER — FENTANYL CITRATE (PF) 50 MCG/ML IJ SOLN (WRAP)
INTRAMUSCULAR | Status: AC
Start: 2018-05-20 — End: 2018-05-20
  Administered 2018-05-20: 22:00:00 25 ug via INTRAVENOUS
  Filled 2018-05-20: qty 2

## 2018-05-20 MED ORDER — OXYCODONE-ACETAMINOPHEN 5-325 MG PO TABS
1.0000 | ORAL_TABLET | Freq: Once | ORAL | Status: DC | PRN
Start: 2018-05-20 — End: 2018-05-21

## 2018-05-20 MED ORDER — BUPIVACAINE-EPINEPHRINE (PF) 0.5% -1:200000 IJ SOLN
INTRAMUSCULAR | Status: DC | PRN
Start: 2018-05-20 — End: 2018-05-20
  Administered 2018-05-20: 30 mL via INTRAMUSCULAR

## 2018-05-20 MED ORDER — CEFAZOLIN SODIUM 1 G IJ SOLR
INTRAMUSCULAR | Status: DC | PRN
Start: 2018-05-20 — End: 2018-05-20
  Administered 2018-05-20: 1 g via INTRAVENOUS

## 2018-05-20 MED ORDER — PROPOFOL 10 MG/ML IV EMUL (WRAP)
INTRAVENOUS | Status: AC
Start: 2018-05-20 — End: ?
  Filled 2018-05-20: qty 20

## 2018-05-20 MED ORDER — HYDROMORPHONE HCL 2 MG PO TABS
ORAL_TABLET | ORAL | Status: AC
Start: 2018-05-20 — End: 2018-05-20
  Administered 2018-05-20: 22:00:00 2 mg via ORAL
  Filled 2018-05-20: qty 1

## 2018-05-20 MED ORDER — ROCURONIUM BROMIDE 50 MG/5ML IV SOLN
INTRAVENOUS | Status: AC
Start: 2018-05-20 — End: ?
  Filled 2018-05-20: qty 5

## 2018-05-20 MED ORDER — CEFAZOLIN SODIUM 1 G IJ SOLR
INTRAMUSCULAR | Status: AC
Start: 2018-05-20 — End: ?
  Filled 2018-05-20: qty 2000

## 2018-05-20 MED ORDER — DIPHENOXYLATE-ATROPINE 2.5-0.025 MG PO TABS
1.00 | ORAL_TABLET | Freq: Four times a day (QID) | ORAL | Status: DC | PRN
Start: 2018-05-20 — End: 2018-05-21
  Administered 2018-05-20 – 2018-05-21 (×2): 1 via ORAL
  Filled 2018-05-20 (×5): qty 1

## 2018-05-20 MED ORDER — ROCURONIUM BROMIDE 10 MG/ML IV SOLN (WRAP)
INTRAVENOUS | Status: DC | PRN
Start: 2018-05-20 — End: 2018-05-20
  Administered 2018-05-20: 5 mg via INTRAVENOUS

## 2018-05-20 MED ORDER — SUCCINYLCHOLINE CHLORIDE 20 MG/ML IJ SOLN
INTRAMUSCULAR | Status: AC
Start: 2018-05-20 — End: ?
  Filled 2018-05-20: qty 10

## 2018-05-20 MED ORDER — DEXAMETHASONE SODIUM PHOSPHATE 4 MG/ML IJ SOLN (WRAP)
INTRAMUSCULAR | Status: DC | PRN
Start: 2018-05-20 — End: 2018-05-20
  Administered 2018-05-20: 10 mg via INTRAVENOUS

## 2018-05-20 MED ORDER — ACETAMINOPHEN 325 MG PO TABS
650.0000 mg | ORAL_TABLET | Freq: Once | ORAL | Status: DC | PRN
Start: 2018-05-20 — End: 2018-05-21

## 2018-05-20 MED ORDER — HYDROMORPHONE HCL 0.5 MG/0.5 ML IJ SOLN
0.5000 mg | INTRAMUSCULAR | Status: AC | PRN
Start: 2018-05-20 — End: 2018-05-21
  Administered 2018-05-20 (×2): 0.5 mg via INTRAVENOUS

## 2018-05-20 MED ORDER — DEXAMETHASONE SODIUM PHOSPHATE 20 MG/5ML IJ SOLN
INTRAMUSCULAR | Status: AC
Start: 2018-05-20 — End: ?
  Filled 2018-05-20: qty 5

## 2018-05-20 MED ORDER — HYDROMORPHONE HCL 1 MG/ML IJ SOLN
INTRAMUSCULAR | Status: DC | PRN
Start: 2018-05-20 — End: 2018-05-20
  Administered 2018-05-20: 1 mg via INTRAVENOUS

## 2018-05-20 MED ORDER — PHENYLEPHRINE 100 MCG/ML IN NACL 0.9% IV SOSY
PREFILLED_SYRINGE | INTRAVENOUS | Status: DC | PRN
Start: 2018-05-20 — End: 2018-05-20
  Administered 2018-05-20 (×5): 50 ug via INTRAVENOUS
  Administered 2018-05-20: 100 ug via INTRAVENOUS

## 2018-05-20 MED ORDER — HYDROMORPHONE HCL 1 MG/ML IJ SOLN
INTRAMUSCULAR | Status: AC
Start: 2018-05-20 — End: ?
  Filled 2018-05-20: qty 1

## 2018-05-20 MED ORDER — EPINEPHRINE HCL 1 MG/ML IJ SOLN (WRAP)
Status: DC | PRN
Start: 2018-05-20 — End: 2018-05-20
  Administered 2018-05-20: 1 mg

## 2018-05-20 MED ORDER — LACTATED RINGERS IV SOLN
INTRAVENOUS | Status: DC
Start: 2018-05-20 — End: 2018-05-21

## 2018-05-20 MED ORDER — FAMOTIDINE 10 MG/ML IV SOLN (WRAP)
INTRAVENOUS | Status: DC | PRN
Start: 2018-05-20 — End: 2018-05-20
  Administered 2018-05-20: 20 mg via INTRAVENOUS

## 2018-05-20 MED ORDER — GLYCOPYRROLATE 0.2 MG/ML IJ SOLN
INTRAMUSCULAR | Status: AC
Start: 2018-05-20 — End: ?
  Filled 2018-05-20: qty 1

## 2018-05-20 MED ORDER — FAMOTIDINE 20 MG/2ML IV SOLN
INTRAVENOUS | Status: AC
Start: 2018-05-20 — End: ?
  Filled 2018-05-20: qty 2

## 2018-05-20 MED ORDER — FENTANYL CITRATE (PF) 50 MCG/ML IJ SOLN (WRAP)
INTRAMUSCULAR | Status: AC
Start: 2018-05-20 — End: ?
  Filled 2018-05-20: qty 2

## 2018-05-20 MED ORDER — DIPHENHYDRAMINE HCL 50 MG/ML IJ SOLN
25.0000 mg | Freq: Four times a day (QID) | INTRAMUSCULAR | Status: DC | PRN
Start: 2018-05-20 — End: 2018-05-21

## 2018-05-20 MED ORDER — SODIUM CHLORIDE 0.9 % IV SOLN
INTRAVENOUS | Status: DC
Start: 2018-05-20 — End: 2018-05-21
  Administered 2018-05-21: 125 mL/h via INTRAVENOUS

## 2018-05-20 MED ORDER — HYDROMORPHONE HCL 1 MG/ML IJ SOLN
INTRAMUSCULAR | Status: AC
Start: 2018-05-20 — End: 2018-05-21
  Administered 2018-05-21: 0.5 mg via INTRAVENOUS
  Filled 2018-05-20: qty 1

## 2018-05-20 MED ORDER — LACTATED RINGERS IV SOLN
50.0000 mL/h | INTRAVENOUS | Status: DC
Start: 2018-05-20 — End: 2018-05-21
  Administered 2018-05-20: 22:00:00 50 mL/h via INTRAVENOUS

## 2018-05-20 MED ORDER — THROMBIN 5000 UNITS EX SOLR
CUTANEOUS | Status: DC | PRN
Start: 2018-05-20 — End: 2018-05-20
  Administered 2018-05-20: 15000 [IU] via TOPICAL

## 2018-05-20 MED ORDER — HYDROMORPHONE HCL 2 MG PO TABS
2.0000 mg | ORAL_TABLET | Freq: Once | ORAL | Status: AC | PRN
Start: 2018-05-20 — End: 2018-05-20

## 2018-05-20 MED ORDER — ONDANSETRON HCL 4 MG/2ML IJ SOLN
4.0000 mg | Freq: Once | INTRAMUSCULAR | Status: DC | PRN
Start: 2018-05-20 — End: 2018-05-21

## 2018-05-20 MED ORDER — MICROFIBRILLAR COLL HEMOSTAT EX POWD
CUTANEOUS | Status: DC | PRN
Start: 2018-05-20 — End: 2018-05-20
  Administered 2018-05-20: 1 g via TOPICAL

## 2018-05-20 MED ORDER — BACITRACIN 50000 UNITS IM SOLR
INTRAMUSCULAR | Status: DC | PRN
Start: 2018-05-20 — End: 2018-05-20
  Administered 2018-05-20: 50000 [IU]

## 2018-05-20 MED ORDER — ONDANSETRON HCL 4 MG/2ML IJ SOLN
INTRAMUSCULAR | Status: DC | PRN
Start: 2018-05-20 — End: 2018-05-20
  Administered 2018-05-20: 4 mg via INTRAVENOUS

## 2018-05-20 MED ORDER — ONDANSETRON HCL 4 MG/2ML IJ SOLN
INTRAMUSCULAR | Status: AC
Start: 2018-05-20 — End: ?
  Filled 2018-05-20: qty 2

## 2018-05-20 MED ORDER — LIDOCAINE HCL 2 % IJ SOLN
INTRAMUSCULAR | Status: DC | PRN
Start: 2018-05-20 — End: 2018-05-20
  Administered 2018-05-20: 60 mg via INTRAVENOUS

## 2018-05-20 MED ORDER — LIDOCAINE HCL (PF) 2 % IJ SOLN
INTRAMUSCULAR | Status: AC
Start: 2018-05-20 — End: ?
  Filled 2018-05-20: qty 5

## 2018-05-20 MED ORDER — FENTANYL CITRATE (PF) 50 MCG/ML IJ SOLN (WRAP)
INTRAMUSCULAR | Status: DC | PRN
Start: 2018-05-20 — End: 2018-05-20
  Administered 2018-05-20 (×2): 50 ug via INTRAVENOUS

## 2018-05-20 MED ORDER — MEPERIDINE HCL 25 MG/ML IJ SOLN
12.5000 mg | Freq: Once | INTRAMUSCULAR | Status: DC
Start: 2018-05-20 — End: 2018-05-21

## 2018-05-20 MED ORDER — EPHEDRINE SULFATE 50 MG/ML IJ/IV SOLN (WRAP)
Status: DC | PRN
Start: 2018-05-20 — End: 2018-05-20
  Administered 2018-05-20: 10 mg via INTRAVENOUS

## 2018-05-20 SURGICAL SUPPLY — 109 items
ANGIO CATH CODM (Procedure Accessories) ×2 IMPLANT
BANDAGE ADHESIVE L2 YD X W6 IN STRETCH (Bandage) ×1
BANDAGE ADHESIVE L2 YD X W6 IN STRETCH NONWOVEN POROUS COVER-ROLL (Bandage) ×1 IMPLANT
BNDG CVRL ADH 2YDX6IN PLSTR POLYACRYLATE (Bandage) ×1
BULB DRAINAGE LIGHTWEIGHT LOW LEVEL (Drain) ×1
BULB DRAINAGE LIGHTWEIGHT LOW LEVEL SUCTION RELIAVAC SILICONE 100 CC (Drain) ×1 IMPLANT
BULB DRN SIL 100CC LF STRL LTWT LO LVL (Drain) ×1
CATHETER IV JELCO 14GA 2IN STRL RADOPQ (IV Supply) ×1
CATHETER IV OD14 GA L2 IN RADIOPAQUE (IV Supply) ×1
CATHETER IV OD14 GA L2 IN RADIOPAQUE JELCO (IV Supply) ×1 IMPLANT
CLEANER ELECTROSURGICAL TIP PENCIL (Procedure Accessories) ×1
CLEANER ELECTROSURGICAL TIP PENCIL HANDSWITCH LECTROBRASIVE (Procedure Accessories) ×1 IMPLANT
CLEANER ESURG TIP PNCL LCTRBRS STRL (Procedure Accessories) ×1
COVER FLEXIBLE LIGHT HANDLE PLASTIC GREEN (Procedure Accessories) ×2 IMPLANT
COVER FLEXIBLE MEDLINE LIGHT HANDLE (Procedure Accessories) ×2
COVER LGHT HNDL PLS LF STRL FLXB DISP (Procedure Accessories) ×2
COVER MAYO CNVRT STND 23IN PLS REINF (Drape) ×1
COVER STAND W23 IN REINFORCE PLASTIC (Drape) ×1 IMPLANT
COVER STND PLS MAYO CNVRT 23IN LF STRL (Drape) ×1
COVER TABLE L90 IN X W65 IN REINFORCE (Drape) ×1
COVER TABLE L90 IN X W65 IN REINFORCE HEAVY DUTY CONVERTORS POLY (Drape) ×1 IMPLANT
COVER TBL POLY CNVRT 90X65IN STRL REINF (Drape) ×1
DECANTER 9 BAG (Procedure Accessories) ×1
DECANTER FLUID L9 IN BAG MEDLINE WHITE (Procedure Accessories) ×1
DECANTER FLUID L9 IN BAG WHITE (Procedure Accessories) ×1 IMPLANT
DRAIN INCS SIL FULL FLUT FLT 3/8IN LF (Drain) ×1
DRAIN RADIOPAQUE 4 FREE FLOW CHANNEL (Drain) ×1
DRAIN RADIOPAQUE 4 FREE FLOW CHANNEL BARD L3/8 IN INCISION SILICONE (Drain) ×1 IMPLANT
DRAPE MAG DEVON 20X16IN LF STRL (Drape) ×1
DRAPE MAGNETIC HAND FREE TRANSFER (Drape) ×1 IMPLANT
DRAPE SRG TBRN CNVRT 122X106X77IN LF (Drape) ×1
DRAPE SRG TBRN LAP 122X106X77IN (Drape) ×1
DRAPE SRG TBRN LG CNVRT 98X72IN STRL (Drape) ×1
DRAPE SURGICAL FANFOLD L98 IN X W72 IN (Drape) ×1
DRAPE SURGICAL FANFOLD L98 IN X W72 IN CONVERTORS TIBURON LARGE (Drape) ×1 IMPLANT
DRAPE SURGICAL IMPERVIOUS REINFORCEMENT FENESTRATE ABSORBENT ARMBOARD (Drape) ×1 IMPLANT
ELECTRODE ADULT PATIENT RETURN L9 FT REM POLYHESIVE ACRYLIC FOAM (Procedure Accessories) ×1 IMPLANT
ELECTRODE PATIENT RETURN L9 FT VALLEYLAB (Procedure Accessories) ×1
ELECTRODE PT RTN RM PHSV ACRL FM C30- LB (Procedure Accessories) ×1
FORCEPS ELECTROSURGICAL L7 3/4 IN 1.5 MM BAYONET BIPOLAR INSULATE CORD (Disposable Instruments) ×1 IMPLANT
FORCEPS ESURG SS 1.5MM BYNT LBRTY S-G (Disposable Instruments) ×2
GAUZE SPONGE VERSLN 4PLY 4X4IN (Dressing) ×2 IMPLANT
GLOVE SRG NTR RBR 8 INDCTR BGL 299X103MM (Glove) ×2
GLOVE SURG BIOGEL ORTHO SZ8 (Glove) ×4 IMPLANT
GLOVE SURGICAL 8 INDICATOR BIOGEL POWDER (Glove) ×2
GLOVE SURGICAL 8 INDICATOR BIOGEL POWDER FREE SMOOTH BEAD CUFF (Glove) ×2 IMPLANT
GOWN SRG FBRC LG STD ROYALSILK LF STRL (Gown) ×1
GOWN SURGICAL LARGE STANDARD FABRIC (Gown) ×1
GOWN SURGICAL LARGE STANDARD FABRIC ROYALSILK LEVEL 3 NONREINFORCE SET (Gown) ×1 IMPLANT
NEEDLE NOKOR FILTER 19GX1.5IN (Needles) ×2 IMPLANT
NEEDLE SPINAL BD OD22 GA L3 1/2 IN (Needles) ×1
NEEDLE SPINAL L3 1/2 IN REGULAR WALL QUINCKE TIP OD22 GA BD (Needles) ×1 IMPLANT
NEEDLE SPNL PP RW BD QNCK 22GA 3.5IN LF (Needles) ×1
PACK SPNE FFX (Pack) ×2 IMPLANT
PAD ABD DERMACEA 9X5IN LF STRL 4 SL EDG (Dressing) ×1
PAD ABD PVC CRTY 9X5IN LF STRL 3 LYR (Dressing) ×2 IMPLANT
PAD ABDOMINAL L9 IN X W5 IN 4 SEAL EDGE (Dressing) ×1 IMPLANT
PAD ARMBOARD FOAM 20X8X2IN (Positioning Supplies) ×3
PAD ARMBOARD L20 IN X W8 IN X H2 IN (Positioning Supplies) ×3
PAD ARMBOARD L20 IN X W8 IN X H2 IN CONVOLUTE FOAM PURPLE (Positioning Supplies) ×3 IMPLANT
POSITIONER HEAD SLOTTED ADLT (Positioning Supplies) ×2 IMPLANT
SLEEVE TED SCD LARGE (Procedure Accessories) ×2 IMPLANT
SMOKE EVACUATION ATTACHMENT (Tubing) ×2 IMPLANT
SOLUTION IRR 0.9% NACL 1000ML LF STRL (Irrigation Solutions) ×1
SOLUTION IRRIGATION 0.9% SODIUM CHLORIDE (Irrigation Solutions) ×1
SOLUTION IRRIGATION 0.9% SODIUM CHLORIDE 1000 ML PLASTIC POUR BOTTLE (Irrigation Solutions) ×1 IMPLANT
SOLUTION SRGPRP 74% ISPRP 0.7% IOD (Prep) ×1
SOLUTION SURGICAL PREP 26 ML DURAPREP (Prep) ×1
SOLUTION SURGICAL PREP 26 ML DURAPREP 74% ISOPROPYL ALCOHOL 0.7% (Prep) ×1 IMPLANT
SPONGE GAUZE 12PLY STRL 4X4IN (Sponge) ×2 IMPLANT
SPONGE LAP CTTN 18X18IN LF STRL 4 PLY (Sponge) ×1
SPONGE LAPAROTOMY L18 IN X W18 IN 4 PLY (Sponge) ×1
SPONGE LAPAROTOMY L18 IN X W18 IN 4 PLY RADIOPAQUE PYRONEMA FREE HIGH (Sponge) ×1 IMPLANT
SPONGE PEANUT C5 HOLDER DISSECTOR XRAY (Sponge) ×1
SPONGE PEANUT C5 HOLDER DISSECTOR XRAY DETECTABLE OD3/8 IN DUKAL (Sponge) ×1 IMPLANT
SPONGE PEANUT RADPQE STRL3/8IN (Sponge) ×1
SPONGE SRG CTTND CDMN SUTUREWELD .5X.5IN (Dressing)
SPONGE SRG CTTND CDMN SUTUREWELD 1X.5IN (Sponge)
SPONGE SRG CTTND CDMN SUTUREWELD 3X1IN (Sponge) ×1
SPONGE SRG VISTEC 8X4IN LF STRL 12 PLY (Sponge) ×1
SPONGE SRGCL 1/2X1/2IN ABSRBNT PRECISION CUT RDPQ PTT CTTND CDMN (Dressing) IMPLANT
SPONGE SURGICAL L1 IN X W1/2 IN ABSORBENT RADIOPAQUE PRECISION CUT (Sponge) IMPLANT
SPONGE SURGICAL L1/2 IN X W1/2 IN (Dressing)
SPONGE SURGICAL L3 IN X W1 IN ABSORBENT (Sponge) ×1
SPONGE SURGICAL L3 IN X W1 IN ABSORBENT PRECISION CUT RADIOPAQUE (Sponge) ×1 IMPLANT
SPONGE SURGICAL L8 IN X W4 IN 12 PLY (Sponge) ×1
SPONGE SURGICAL L8 IN X W4 IN 12 PLY RADIOPAQUE BAND VISTEC BLUE WHITE (Sponge) ×1 IMPLANT
STRIP SKIN CLOSURE L5 IN X W1 IN (Dressing) ×1
STRIP SKIN CLOSURE L5 IN X W1 IN REINFORCE STERI-STRIP POLYESTER WHITE (Dressing) ×1 IMPLANT
STRIP SKNCLS PLSTR STRSTRP 5X1IN LF STRL (Dressing) ×1
SUTURE ABS 1 CT1 VCL 18IN CR BRD 8 STRN (Suture) ×2
SUTURE ABS 2-0 CT1 VCL 18IN CR BRD 8 (Suture) ×2
SUTURE ABS 3-0 PS1 VCL MTPS 27IN BRD (Suture) ×1
SUTURE COATED VICRYL 1 CT-1 L18 IN (Suture) ×2 IMPLANT
SUTURE COATED VICRYL 2-0 CT-1 18IN CNTRL BRD 8 STRND UNDYED (Suture) ×2 IMPLANT
SUTURE COATED VICRYL 2-0 CT-1 L18 IN (Suture) ×2
SUTURE COATED VICRYL 3-0 PS-1 L27 IN (Suture) ×1 IMPLANT
SYRINGE LUER LOCK 10CC (Syringes, Needles) ×8 IMPLANT
SYSTEM IRR MTL VTVU YNKR 12FR LF STRL (Suction) ×2
SYSTEM IRRIGATION EXTEND SUCTION ILLUMINATION TUBE BULB TIP VITAL-VUE (Suction) ×1 IMPLANT
TBNG CON SCTN STRL .1875X10 (Tubing) ×2
TOOL DISSECTING L14 CM MATCH HEAD FLUTE (Burr) ×1
TOOL DISSECTING L14 CM MATCH HEAD FLUTE OD3 MM MIDAS REX LEGEND (Burr) ×1 IMPLANT
TOOL DSCT LGND 3MM 14MM (Burr) ×1
TOWEL L26 IN X W17 IN COTTON PREWASH DELINT BLUE ACTISORB DELUXE (Procedure Accessories) ×2 IMPLANT
TOWEL SRG CTTN 26X17IN LF STRL PREWASH (Procedure Accessories) ×4
TOWEL STERILE REUSABLE 8PK (Procedure Accessories) ×2 IMPLANT
TUBING SUCTION ID3/16 IN L10 FT (Tubing) ×2
TUBING SUCTION ID3/16 IN L10 FT NONCONDUCTIVE FML CONNECTOR MEDLN (Tubing) ×2 IMPLANT

## 2018-05-20 NOTE — Transfer of Care (Signed)
Anesthesia Transfer of Care Note    Patient: Matthew [name]    Procedures performed: Procedure(s) with comments:  REMOVAL POSTERIOR LUMBAR SPINE HARDWARE L4-S1 - REMOVAL OF HARDWARE LUMBAR SPINE    Anesthesia type: General ETT    Patient location:PACU    Last vitals:   Vitals:    05/20/18 2136   BP: 112/80   Pulse: 88   Resp: (!) 25   Temp: 36.4 C (97.6 F)   SpO2: 100%       Post pain: Continue adjustment of pain medication; dosed by CRNA, PACU RN will cont to manage as per post op orders     Mental Status:awake and alert     Respiratory Function: tolerating face mask    Cardiovascular: stable    Nausea/Vomiting: patient not complaining of nausea or vomiting    Hydration Status: adequate    Post assessment: no apparent anesthetic complications, no reportable events and no evidence of recall    Signed by: Alta Corning  05/20/18 9:36 PM

## 2018-05-20 NOTE — Anesthesia Postprocedure Evaluation (Signed)
Anesthesia Post Evaluation    Patient: Matthew [name]    Procedure(s) with comments:  REMOVAL POSTERIOR LUMBAR SPINE HARDWARE L4-S1 - REMOVAL OF HARDWARE LUMBAR SPINE    Anesthesia type: general    Last Vitals:   Vitals:    05/20/18 2136   BP: 112/80   Pulse: 88   Resp: (!) 25   Temp: 36.4 C (97.6 F)   SpO2: 100%       Anesthesia Post Evaluation:     Patient Evaluated: bedside  Patient Participation: complete - patient participated  Level of Consciousness: awake    Pain control: patient will continue to be treated for pain in PACU.    Airway Patency: patent    Anesthetic complications: No      PONV Status: none    Cardiovascular status: acceptable  Respiratory status: acceptable  Hydration status: acceptable        Anesthesia Qualified Clinical Data Registry 2018    PACU Reintubation  Did the Patient have general anesthesia with intubation: Yes  Did the Patient require reintubation in the PACU?: No  Was this a planned exubation trial (documented in the medical record)?: No    PONV Adult  Is the patient aged 62 or older: Yes  Did the patient receive recieve a general anesthestic: Yes  Does the patient have 3 or more risk factors for PONV? No        PONV Pediatric  Is the patient aged 52-17? No            PACU Transfer Checklist Protocol  Was the patient transferred to the PACU at the conclusion of surgery? Yes  Was a checklist or transfer protocol used? Yes    ICU Transfer Checklist Protocol  Was the patient transferred to the ICU at the conclusion of surgery? No      Post-op Pain Assessment Prior to Anesthesia Care End  Age >=18 and assessed for pain in PACU: Yes  Pacu pain score <7/10: No      Perioperative Mortality  Perioperative mortality prior to Anesthesia end time: No    Perioperative Cardiac Arrest  Did the patient have an unanticipated intraoperative cardiac arrest between anesthesia start time and anesthesia end time? No    Unplanned Admission to ICU  Did the patient have an unplanned admission to the  ICU (not initially anticipated at anesthesia start time)? No      Signed by: Michail Sermon, 05/20/2018 10:03 PM
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
## 2018-05-20 NOTE — H&P (Signed)
History and physical reviewed and patient re-examined today.  No changes noted.

## 2018-05-21 ENCOUNTER — Encounter: Payer: Self-pay | Admitting: Orthopaedic Surgery

## 2018-05-21 LAB — PRESURGICAL SURVEILLANCE, MSSA+MRSA: Culture Staph and MRSA Surveillance: POSITIVE — AB

## 2018-05-21 MED ORDER — OXYCODONE HCL 5 MG PO TABS
10.0000 mg | ORAL_TABLET | ORAL | Status: DC | PRN
Start: 2018-05-21 — End: 2018-05-21
  Administered 2018-05-21 (×3): 10 mg via ORAL
  Filled 2018-05-21 (×3): qty 2

## 2018-05-21 MED ORDER — OXYCODONE-ACETAMINOPHEN 5-325 MG PO TABS
1.0000 | ORAL_TABLET | ORAL | 0 refills | Status: DC | PRN
Start: 2018-05-21 — End: 2018-05-29

## 2018-05-21 MED ORDER — MAGNESIUM HYDROXIDE 400 MG/5ML PO SUSP
10.0000 mL | ORAL | Status: DC | PRN
Start: 2018-05-21 — End: 2018-05-21

## 2018-05-21 MED ORDER — ONDANSETRON 4 MG PO TBDP
4.0000 mg | ORAL_TABLET | Freq: Three times a day (TID) | ORAL | Status: DC | PRN
Start: 2018-05-21 — End: 2018-05-21

## 2018-05-21 MED ORDER — FLEET ENEMA 7-19 GM/118ML RE ENEM
1.0000 | ENEMA | Freq: Once | RECTAL | Status: DC | PRN
Start: 2018-05-21 — End: 2018-05-21

## 2018-05-21 MED ORDER — BENZOCAINE-MENTHOL 15-3.6 MG MT LOZG
1.0000 | LOZENGE | OROMUCOSAL | Status: DC | PRN
Start: 2018-05-21 — End: 2018-05-21

## 2018-05-21 MED ORDER — PROMETHAZINE HCL 25 MG PO TABS
25.0000 mg | ORAL_TABLET | Freq: Four times a day (QID) | ORAL | Status: DC | PRN
Start: 2018-05-21 — End: 2018-05-21

## 2018-05-21 MED ORDER — TRAZODONE HCL 50 MG PO TABS
50.0000 mg | ORAL_TABLET | Freq: Every evening | ORAL | Status: DC
Start: 2018-05-21 — End: 2018-05-21

## 2018-05-21 MED ORDER — CYCLOBENZAPRINE HCL 10 MG PO TABS
5.0000 mg | ORAL_TABLET | Freq: Three times a day (TID) | ORAL | Status: DC | PRN
Start: 2018-05-21 — End: 2018-05-21
  Administered 2018-05-21 (×2): 5 mg via ORAL
  Filled 2018-05-21 (×2): qty 1

## 2018-05-21 MED ORDER — PROMETHAZINE HCL 25 MG RE SUPP
25.0000 mg | Freq: Four times a day (QID) | RECTAL | Status: DC | PRN
Start: 2018-05-21 — End: 2018-05-21

## 2018-05-21 MED ORDER — PRAMIPEXOLE DIHYDROCHLORIDE 0.5 MG PO TABS
0.5000 mg | ORAL_TABLET | Freq: Every evening | ORAL | Status: DC
Start: 2018-05-21 — End: 2018-05-21

## 2018-05-21 MED ORDER — CYCLOBENZAPRINE HCL 5 MG PO TABS
5.0000 mg | ORAL_TABLET | Freq: Three times a day (TID) | ORAL | 0 refills | Status: AC | PRN
Start: 2018-05-21 — End: ?

## 2018-05-21 MED ORDER — MORPHINE SULFATE ER 15 MG PO TBCR
15.00 mg | EXTENDED_RELEASE_TABLET | Freq: Two times a day (BID) | ORAL | Status: DC
Start: 2018-05-21 — End: 2018-05-21
  Administered 2018-05-21: 11:00:00 15 mg via ORAL
  Filled 2018-05-21: qty 1

## 2018-05-21 MED ORDER — BISACODYL 10 MG RE SUPP
10.0000 mg | Freq: Every day | RECTAL | Status: DC | PRN
Start: 2018-05-21 — End: 2018-05-21

## 2018-05-21 MED ORDER — SODIUM CHLORIDE 0.9 % IV MBP
1.0000 g | Freq: Three times a day (TID) | INTRAVENOUS | Status: AC
Start: 2018-05-21 — End: 2018-05-21
  Administered 2018-05-21 (×2): 1 g via INTRAVENOUS
  Filled 2018-05-21: qty 100
  Filled 2018-05-21: qty 1000
  Filled 2018-05-21: qty 100
  Filled 2018-05-21: qty 1000

## 2018-05-21 MED ORDER — ONDANSETRON HCL 4 MG/2ML IJ SOLN
4.0000 mg | Freq: Three times a day (TID) | INTRAMUSCULAR | Status: DC | PRN
Start: 2018-05-21 — End: 2018-05-21

## 2018-05-21 MED ORDER — OXYCODONE-ACETAMINOPHEN 5-325 MG PO TABS
1.0000 | ORAL_TABLET | ORAL | Status: DC | PRN
Start: 2018-05-21 — End: 2018-05-21

## 2018-05-21 MED ORDER — SENNOSIDES-DOCUSATE SODIUM 8.6-50 MG PO TABS
1.0000 | ORAL_TABLET | Freq: Two times a day (BID) | ORAL | Status: DC
Start: 2018-05-21 — End: 2018-05-21
  Filled 2018-05-21: qty 1

## 2018-05-21 MED ORDER — HYDROMORPHONE HCL 0.5 MG/0.5 ML IJ SOLN
0.4000 mg | INTRAMUSCULAR | Status: DC | PRN
Start: 2018-05-21 — End: 2018-05-21
  Administered 2018-05-21: 0.4 mg via INTRAVENOUS
  Filled 2018-05-21: qty 1

## 2018-05-21 MED ORDER — NALOXONE HCL 4 MG/0.1ML NA LIQD
NASAL | 0 refills | Status: DC
Start: 2018-05-21 — End: 2018-05-29

## 2018-05-21 MED ORDER — DULOXETINE HCL 20 MG PO CPEP
20.0000 mg | ORAL_CAPSULE | Freq: Every day | ORAL | Status: DC
Start: 2018-05-21 — End: 2018-05-21
  Administered 2018-05-21: 09:00:00 20 mg via ORAL
  Filled 2018-05-21: qty 1

## 2018-05-21 MED ORDER — PANTOPRAZOLE SODIUM 40 MG PO TBEC
40.00 mg | DELAYED_RELEASE_TABLET | Freq: Every morning | ORAL | Status: DC
Start: 2018-05-21 — End: 2018-05-21
  Administered 2018-05-21: 11:00:00 40 mg via ORAL
  Filled 2018-05-21: qty 1

## 2018-05-21 MED ORDER — LOPERAMIDE HCL 2 MG PO CAPS
2.0000 mg | ORAL_CAPSULE | ORAL | Status: DC | PRN
Start: 2018-05-21 — End: 2018-05-21
  Administered 2018-05-21: 12:00:00 2 mg via ORAL
  Filled 2018-05-21: qty 1

## 2018-05-21 MED ORDER — PROMETHAZINE HCL 25 MG/ML IJ SOLN
6.5000 mg | Freq: Four times a day (QID) | INTRAMUSCULAR | Status: DC | PRN
Start: 2018-05-21 — End: 2018-05-21

## 2018-05-21 MED ORDER — LISINOPRIL 20 MG PO TABS
ORAL_TABLET | Freq: Every day | ORAL | Status: DC
Start: 2018-05-21 — End: 2018-05-21
  Filled 2018-05-21: qty 1
  Filled 2018-05-21: qty 2

## 2018-05-21 MED ORDER — DIPHENOXYLATE-ATROPINE 2.5-0.025 MG PO TABS
1.0000 | ORAL_TABLET | Freq: Every day | ORAL | Status: DC
Start: 2018-05-21 — End: 2018-05-21
  Administered 2018-05-21: 12:00:00 1 via ORAL
  Filled 2018-05-21: qty 1

## 2018-05-21 MED ORDER — DIPHENHYDRAMINE HCL 25 MG PO CAPS
25.0000 mg | ORAL_CAPSULE | Freq: Four times a day (QID) | ORAL | Status: DC | PRN
Start: 2018-05-21 — End: 2018-05-21

## 2018-05-21 NOTE — Discharge Summary (Addendum)
DISCHARGE NOTE    Date Time: 05/21/18 11:11 AM  Patient Name: Matthew [name]  Attending Physician: Clelia Schaumann, MD    Date of Admission:   05/20/2018    Date of Discharge:   05/21/2018    Reason for Admission:   Chronic bilateral low back pain with bilateral sciatica [M54.42, M54.41, G89.29]  Painful orthopaedic hardware [T84.84XA]    Problems:   Lists the present on admission hospital problems  Present on Admission:  . Painful orthopaedic hardware      Hospital Problems:  Active Problems:    Painful orthopaedic hardware      Problem Lists:  Patient Active Problem List   Diagnosis   . Degenerative lumbar spinal stenosis   . Lumbar stenosis   . Spondylolisthesis of lumbar region   . Hypertension   . BPH (benign prostatic hypertrophy)   . History of lumbar laminectomy for spinal cord decompression   . Pulmonary embolism, bilateral   . Painful orthopaedic hardware          Discharge Dx:   Chronic bilateral low back pain with bilateral sciatica [M54.42, M54.41, G89.29]  Painful orthopaedic hardware [T84.84XA]      Consultations:   none    Procedures performed:   Removal of orthopedic hardware    Hospital Course:   Satisfactory post op course    Discharge Medications:     Current Discharge Medication List      START taking these medications    Details   cyclobenzaprine (FLEXERIL) 5 MG tablet Take 1 tablet (5 mg total) by mouth 3 (three) times daily as needed for Muscle spasms  Qty: 35 tablet, Refills: 0      naloxone (NARCAN) 4 MG/0.1ML nasal spray 1 spray intranasally. If pt does not respond or relapses into respiratory depression call 911. Give additional doses every 2-3 min.  Qty: 2 each, Refills: 0      oxyCODONE-acetaminophen (PERCOCET) 5-325 MG per tablet Take 1 tablet by mouth every 4 (four) hours as needed for Pain  Qty: 35 tablet, Refills: 0         CONTINUE these medications which have NOT CHANGED    Details   ALPRAZolam (XANAX) 0.5 MG tablet Take 0.5 mg by mouth nightly as needed       amlodipine-benazepril (LOTREL) 10-20 MG per capsule Take 1 capsule of your 5mg -10mg  dose capsule. This was prescribed by your primacy care provider.      Diphenoxylate-Atropine (LOMOTIL PO) Take by mouth      DULoxetine (CYMBALTA) 20 MG capsule Take 20 mg by mouth daily      HYDROmorphone (DILAUDID) 2 MG tablet Take 2 mg by mouth every 4 (four) hours as needed for Pain      Loperamide HCl (IMODIUM PO) Take by mouth      Morphine Sulfate ER 15 MG Tablet Extended Release Abuse-Deterrent Take by mouth daily      naproxen sodium (ANAPROX) 220 MG tablet Take 220 mg by mouth 2 (two) times daily with meals      pramipexole (MIRAPEX) 0.5 MG tablet Take 0.5 mg by mouth nightly      traZODone (DESYREL) 50 MG tablet Take 50 mg by mouth nightly               Discharge Instructions:      Follow-up with Ree Edman, PA-C in 2 weeks     Signed by: Ree Edman, PA-C

## 2018-05-21 NOTE — PT Eval Note (Signed)
[facility]   Physical Therapy Evaluation   Patient: Matthew [name]    MRN#: [id number]   Unit: [facility] TOWER 8  Bed: F830/F830.01    Discharge Recommendations:   Discharge Recommendation: Home with supervision (supervision with stairs)     DME Recommendation: issued [facility] and has access to rw    Assessment:   Matthew [name] is a 77 y.o. male admitted 05/20/2018.  Pt is independent with all functional mobility including stairs with railing and is safe for [place] home from PT stand point. See below for details of session.    Therapy Diagnosis: gait abnormality    Treatment Activities: eval, gt training    Informed patient  of the risks and benefits of participating in PT.    Educated the patient to role of physical therapy, plan of care, goals of therapy and safety with mobility and ADLs, home safety.    Plan:   D/C PT. No further PT indicated at the acute care level.    PT Frequency: one time visit - therapy discontinued            Precautions and Contraindications:   falls    Activity Orders  As tolerated     Consult received for Matthew [name] for PT Evaluation and Treatment.  Patient's medical condition is appropriate for Physical therapy intervention at this time.    Medical Diagnosis: Chronic bilateral low back pain with bilateral sciatica [M54.42, M54.41, G89.29]  Painful orthopaedic hardware [T84.84XA]      History of Present Illness:   Matthew [name] is a 76 y.o. male admitted on 05/20/2018 for REMOVAL POSTERIOR LUMBAR SPINE HARDWARE L4-S1      Past Medical/Surgical History:  Past Medical History:   Diagnosis Date   . Anxiety 2000   . BPH (benign prostatic hypertrophy) 2007    w/ hx of obstruction - s/p suprapubic prostatectomy   . Disorder of prostate 1999   . Fibromyalgia     in remission   . Gastroesophageal reflux disease    . Hiatal hernia 2019   . History of pancreatitis 2007    had microscopic stones - ERCP w/ stent done   . Hyperlipidemia 1995   . Hypertension     controlled    . 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 Pancreatitis 2014   . Spinal stenosis    . Spondylolisthesis    . Syncopal episodes approx 2012    1x episode - had negative cardiac work-up - no episodes sinice      Past Surgical History:   Procedure Laterality Date   . APPENDECTOMY  1952   . CERVICAL FUSION  1997    states has full ROM of neck   . CHOLECYSTECTOMY  1980   . ERCP, STENT  2007   . LAMINECTOMY, POSTERIOR LUMBAR, DECOMP, FUSION, LEVEL 2 N/A 11/10/2015    Procedure: LAMINECTOMY, POSTERIOR LUMBAR, DECOMP, FUSION, LEVEL 2;  Surgeon: Clelia Schaumann, MD;  Location: [place] TOWER OR;  Service: Orthopedics;  Laterality: N/A;  L4-S1 LAMINECTOMY/FUSION   . REMOVAL, POSTERIOR LUMBAR SPINE HARDWARE N/A 05/20/2018    Procedure: REMOVAL POSTERIOR LUMBAR SPINE HARDWARE L4-S1;  Surgeon: Clelia Schaumann, MD;  Location: [facility] TOWER OR;  Service: Orthopedics;  Laterality: N/A;  REMOVAL OF HARDWARE LUMBAR SPINE   . SUPRAPUBIC PROSTATECTOMY  2007   . TONSILLECTOMY  as child   .  VASECTOMY  1980's           X-Rays/Tests/Labs:   Fluoroscopic guidance.       Social History:   Prior Level of Function:  Prior level of function: Independent with ADLs, Ambulates with assistive device  Baseline Activity Level: Community ambulation  Driving: independent  DME Currently at Home: Single point cane, Front wheel walker    Home Living Arrangements:  Living Arrangements: Alone  Type of Home: House  Home Layout: Multi-level  Bathroom Shower/Tub: Medical sales representative: Standard  DME Currently at Home: Single point cane, Front wheel walker    Subjective:    Patient is agreeable to participation in the therapy session. Family and/or guardian are agreeable to patient's participation in the therapy session. Nursing clears patient for therapy.     Pt reports the amount of pain is surprising him    Patient Goal: decreased pain    Pain:   Scale: 7/10  Location: LB at incision  site  Intervention: pain medication, mobility, reposition       Objective:    Patient is in bed with dressing LB,  IV access in place.    dtr present    Cognitive Status and Neuro Exam:  Arousal/Alertness: Appropriate responses to stimuli  Attention Span: Appears intact  Memory: Appears intact  Following Commands: Follows all commands and directions without difficulty  Safety Awareness: independent  Insights: Fully aware of deficits  Problem Solving: Able to problem solve independently    Patient is alert and oriented x 3 and follows directions without difficulty.     Sensation: intact to light touch; denies paresthesia     Postural Assessment:   increased kyphosis and forward head    Musculoskeletal Examination  RLE ROM: WFL  LLE ROM: WFL     RLE Strength: WFL   LLE Strength: WFL     UE Strength and ROM:  WFL      Functional Mobility  Rolling: ind  Supine to Sit: ind  Sit to Supine: ind  Scooting: ind  Sit to Stand: ind  Stand to Sit: ind  Transfers: ind with rw    PMP - Progressive Mobility Protocol   PMP Activity: Step 7 - Walks out of Room  Distance Walked (ft) (Step 6,7): 200 Feet     Ambulation  Weightbearing Status: no restrictions  Device Used: rw  Level of assistance required: none  Ambulation Distance: 200'  Pattern: WNL  Stair Management: ind with rail and SPC for inside steps;  Will need supervision for outside steps which only have a column for support, no railing     Balance  Static Sitting: normal  Dynamic Sitting: normal  Static Standing: good with rw  Dynamic Standing: good with rw; good with steps using railing and SPC; fair with steps without railing    Coordination/Motor Planning   WNL    Participation Effort: good    Endurance: good      Pt left supine with needs met with RN informed of status as well as outcome of PT session.    Patient left with call bell and phone within reach, SCD, fall mat. All needs met and all questions answered      Imperato Bradley, P.T.  Pager number [phone]    Time of  treatment:   PT Received On: 05/21/18  Start Time: 1348  Stop Time: 1424  Time Calculation (min): 36 min

## 2018-05-21 NOTE — Malnutrition Assessment (Signed)
Matthew [name] is a 77 y.o. male patient.   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

## 2018-05-21 NOTE — Progress Notes (Signed)
Nutrition:  Reason for Assessment: nursing screen for wt loss + poor appetite      Recommendations:   1) Continue regular diet  2) Will order Ensure Enlive TID  3) Monitor wt trend and encourage po intake    Assessment: Matthew [name] is a(n) 77 y.o. male admitted for painful orthopaedic hardware.     Spoke with pt and family at bedside. Pt reports poor po intake for the past year and chronic diarrhea. Reports wt loss of 30#. States drinking Ensure Enlive at home, eats 2 small meals daily. Said he saw a doctor PTA who mentioned evaluating pt's stool to see if he needs pancreatic enzymes. Noted plan for pt to go home today, encouraged pt to follow up with MD as this can help avoid further wt loss. Provided pt with High Calorie, High Protein containing Nutrition Therapy handout and discussed strategies to increase po intake.        Current Diet Order    Orders Placed This Encounter   Procedures   . Diet regular       Anthropometrics:  Height: 175.3 cm (5' 9.02")  Weight: 59.9 kg (132 lb 0.9 oz)  Body mass index is 19.49 kg/m.    %Weight change: 18.5% wt loss x 1 year per pt report    Pertinent Labs: reviewed  Pertinent Meds: pericolace, lactated ringers    Edema: none  Skin: no pressure injuries  GI: chronic diarrhea    Nutrition Focused Physical Exam (NFPE):   Head: depression in temporalis muscle  Upper Body: clavicle bone slightly protruding, squaring of shoulder to arm joint  Lower Body: deferred    ESTIMATED NEEDS  Estimated Energy Needs  Total Energy Estimated Needs: 1800-2100 kcals  Method for Estimating Needs: 30-35 kcals/kg    Estimated Protein Needs  Total Protein Estimated Needs: 72-90 g  Method for Estimating Needs: 1.2-1.5 g/kg    Fluid Needs  Total Fluid Estimated Needs: 1800-2100 ml  Method for Estimating Needs: 1 ml/kcal      Nutrition Diagnosis:   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

## 2018-05-21 NOTE — Progress Notes (Signed)
Cm met pt and daughter Jasson Siegmann ) in room . Clarified demopgraphic , pcp and insurance.  Pt pcp is not listed. Pt states he just went to the new MD before surgery and will use the doctor to be his primary ( Dr. Romeo Rabon / [phone]) . Pt will be d/c to his daughter Lorne Skeens) home in [place] until he is stable to go back to his own home in [place].  Daughter Cena Benton) will pick up pt after 2 pm today . Pt has all DME in place . Awaiting for PT/OT eval.     05/21/18 1357   Healthcare Decisions   Orientation/Decision Making Abilities of Patient Alert and Oriented x3, able to make decisions   Advance Directive Patient does not have advance directive   Healthcare Agent Appointed No   Prior to admission   Prior level of function Independent with ADLs;Ambulates with assistive device   Type of Residence Private residence   Home Layout Multi-level   Living Arrangements Alone   How do you get to your MD appointments? self   How do you get your groceries? self   Who fixes your meals? self   Who does your laundry? self   Who picks up your prescriptions? self   Dressing Independent   Grooming Independent   Feeding Independent   Bathing Independent   Toileting Independent   DME Currently at Home Single point cane;Front wheel walker   Adult Protective Services (APS) involved? No   Discharge Planning   Support Systems Family members   Patient expects to be discharged to: (home with supervision )   Anticipated [place] plan discussed with: Same as interviewed   Mode of transportation: Private car (family member)   Consults/Providers   PT Evaluation Needed 1   OT Evalulation Needed 1   SLP Evaluation Needed 2   Outcome Palliative Care Screen Screened but did not meet criteria for intervention   Correct PCP listed in Epic? Yes   PCP   PCP on file was verified as the current PCP? Yes  (dr. Romeo Rabon [phone]) )   Important Message from Medicare Notice   Patient received 1st IMM Letter? Yes   Sherryl Manges,  RN/BSN  Case Management  Spectra x [phone]

## 2018-05-21 NOTE — UM Notes (Signed)
UTILIZATION REVIEW CONTACT: Name: Alfonso Patten RN, MSN, CCM, ACM  Clinical Case Manager  - Utilization Review  [facility]  Address:  [address] [place], [place]  [address]  NPI:   [id number]  Tax ID:  [id number]  Phone: [phone]  Fax: [phone]  Email: [email or web address]    Please use fax number [phone] to provide authorization for hospital services or to request additional information.        PATIENT NAME: Matthew [name], Matthew [name]   DOB: 04-22-1941   PMH:  has a past medical history of Anxiety (2000); BPH (benign prostatic hypertrophy) (2007); Disorder of prostate (1999); Fibromyalgia; Gastroesophageal reflux disease; Hiatal hernia (2019); History of pancreatitis (2007); Hyperlipidemia (1995); Hypertension; Malignant neoplasm of skin; Pancreatitis (2014); Spinal stenosis; Spondylolisthesis; and Syncopal episodes (approx 2012).  PSH:  has a past surgical history that includes ERCP, STENT (2007); Suprapubic prostatectomy (2007); Cholecystectomy (1980); Vasectomy (1980's); Cervical fusion (1997); Appendectomy (1952); Tonsillectomy (as child); LAMINECTOMY, POSTERIOR LUMBAR, DECOMP, FUSION, LEVEL 2 (N/A, 11/10/2015); and REMOVAL, POSTERIOR LUMBAR SPINE HARDWARE (N/A, 05/20/2018).     05/20/18 1544  Admit to Inpatient          Admission date: 05/20/2018, IP only procedure  Pt is a 77 y.o. male who arrived at [facility] for elective surgery/procedure.    Admission diagnosis: Chronic bilateral low back pain with bilateral sciatica [M54.42, M54.41, G89.29]  Painful orthopaedic hardware [T84.84XA]    Surgical Procedure(s) completed: Procedure(s):  REMOVAL POSTERIOR LUMBAR SPINE HARDWARE L4-S1    ----------------------------------------------------  05/21/18, Med/Surg    Procedure(s) with comments:  REMOVAL POSTERIOR LUMBAR SPINE HARDWARE L4-S1 - REMOVAL OF HARDWARE LUMBAR SPINE    1 Day Post-Op  -------------------     Visit Vitals  BP 114/67   Pulse 76   Temp (!) 96.4 F (35.8 C) (Oral)   Resp 18   Ht  1.753 m (5' 9.02")   Wt 59.9 kg (132 lb 0.9 oz)   SpO2 97%   BMI 19.49 kg/m     I/O last 3 completed shifts:  In: 1160 [P.O.:60; I.V.:1100]  Out: 10 [Blood:10]    Medications:   Current Facility-Administered Medications   Medication Dose Route Frequency   . ceFAZolin  1 g Intravenous Q8H SCH   . diphenoxylate-atropine  1 tablet Oral Daily   . DULoxetine  20 mg Oral Daily   . lisinopril-amLODIPine 20-10 combo dose   Oral Daily   . pramipexole  0.5 mg Oral QHS   . senna-docusate  1 tablet Oral BID   . traZODone  50 mg Oral QHS     . sodium chloride 125 mL/hr (05/21/18 0000)   . lactated ringers Stopped (05/20/18 2130)      Primary Payor: MEDICARE / Plan: MEDICARE PART A AND B / Product Type: Medicare /   NOTES TO REVIEWER:    This clinical review is based on/compiled from documentation provided by the treatment team within the patient's medical record.

## 2018-05-21 NOTE — Progress Notes (Signed)
Daughter is in room , will pick up pt at d/c .  CM discuss pt to d/c home with supervision .  Pt will stay with daughter in [place] until stable enough to go home in [place].     05/21/18 1410   Discharge Disposition   Physical Discharge Disposition Home  (home with supervision )   Mode of Transportation (Daughter Cena Benton) will pick up at d/c)   Patient/Family/POA notified of transfer plan Yes   Bedside nurse notified of transport plan? Yes   CM Interventions   Follow up appointment scheduled? No   Reason no follow up scheduled? Family to schedule   Multidisciplinary rounds/family meeting before d/c? Yes   Medicare Checklist   Is this a Medicare patient? Yes   If LOS 3 days or greater, did patient received 2nd IMM Letter? No   Sherryl Manges, RN/BSN  Case Management  Spectra x [phone]

## 2018-05-21 NOTE — Progress Notes (Signed)
Spine navigator Follow up with patient regarding plan of care, pain management, Spine precautions, ambulation, home dsg instructions, and [place] planning. Patient states his pain is well controlled, has ambulated, voided, understands dsg instructions, family at bedside. Patient doing well. [place] home later

## 2018-05-21 NOTE — Plan of Care (Signed)
Problem: Lumbar Discectomy/Laminectomy  Goal: Pain at adequate level as identified by patient  Outcome: Progressing  Pt A&O X4. VSS.  Tolerated whole pills with thin liquids.  Pt able to ambulate to bathroom as well as reposition self in bed without assistance.   Encouraged TCDB and IS use.   Continent of bowel and bladder. LBM 05/21/18. Pt voiding via urinal & bathroom.   Pain Controlled with roxi/percocet/dilaudid.   Good pedal pulses bilaterally,cap refill <3 sec in all 4 extremities.  All dressings are clean, dry, and intact and all sites appear free from visual signs of infection.    Call bell and bedside table within reach at all times. Will continue to monitor patient closely.     Frederica Kuster  05/21/18  4:25 AM

## 2018-05-21 NOTE — OT Eval Note (Signed)
[facility]   Occupational Therapy Evaluation     Patient: Matthew [name]    MRN#: [id number]   Unit: [facility] TOWER 8  Bed: F830/F830.01                                     Discharge Recommendations:   Discharge Recommendation: Home with supervision (and assistance with adls as needed)     DME Recommended for Discharge: Shower chair    Assessment:   Matthew [name] is a 77 y.o. male admitted 05/20/2018.   Expanded chart review completed including review of labs, review of imaging, review of H&P and physician progress notes, review of OR report and review of consulting physician notes .  Pt's ability to complete ADLs and functional transfers is impaired due to the following deficits:  decreased activity tolerance, decreased balance, decreased bed mobility, gait impairment and spine precautions.  Pt demonstrates performance deficits with bathing, dressing, toileting and functional mobility. There are a few comorbidities or other factors that affect plan of care and require modification of task including: frequent falls, has stairs to manage and lives alone.  Pt would continue to benefit from OT to address these deficits and increase functional independence.      Impairments: Assessment: decreased independence with ADLs      Therapy Diagnosis: decreased adl I    Rehabilitation Potential: Prognosis: Good;With continued OT s/p acute discharge     Treatment Activities: OT Eval, adl re training, functional transfer training    Educated the patient to role of occupational therapy, plan of care, goals of therapy and HEP, safety with mobility and ADLs.    Plan:   OT Frequency Recommended: 4-5x/wk     Treatment Interventions: ADL retraining;Functional transfer training;Patient/Family training;Equipment eval/education     Risks/benefits/POC discussed w/ patient         Precautions and Contraindications:   Precautions  Weight Bearing Status: no restrictions  Precaution Instructions Given to Patient:  Yes  Spinal Precautions: no bending, no twisting, no lifting  Other Precautions: Falls,       Consult received for Ranee Gosselin for OT Evaluation and Treatment.  Patient's medical condition is appropriate for Occupational Therapy intervention at this time.    Admitting Diagnosis: Chronic bilateral low back pain with bilateral sciatica [M54.42, M54.41, G89.29]  Painful orthopaedic hardware [T84.84XA]      History of Present Illness:    Matthew [name] is a 77 y.o. male admitted on 05/20/2018 with Chronic bilateral low back pain with bilateral sciatica [M54.42, M54.41, G89.29]  Painful orthopaedic hardware   Procedure(s) with comments:  REMOVAL POSTERIOR LUMBAR SPINE HARDWARE L4-S1 - REMOVAL OF HARDWARE LUMBAR SPINE      Past Medical/Surgical History:  Past Medical History:   Diagnosis Date   . Anxiety 2000   . BPH (benign prostatic hypertrophy) 2007    w/ hx of obstruction - s/p suprapubic prostatectomy   . Disorder of prostate 1999   . Fibromyalgia     in remission   . Gastroesophageal reflux disease    . Hiatal hernia 2019   . History of pancreatitis 2007    had microscopic stones - ERCP w/ stent done   . Hyperlipidemia 1995   . Hypertension     controlled   . 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 Pancreatitis 2014   . Spinal stenosis    . Spondylolisthesis    . Syncopal episodes approx 2012    1x episode - had negative cardiac work-up - no episodes sinice     Past Surgical History:   Procedure Laterality Date   . APPENDECTOMY  1952   . CERVICAL FUSION  1997    states has full ROM of neck   . CHOLECYSTECTOMY  1980   . ERCP, STENT  2007   . LAMINECTOMY, POSTERIOR LUMBAR, DECOMP, FUSION, LEVEL 2 N/A 11/10/2015    Procedure: LAMINECTOMY, POSTERIOR LUMBAR, DECOMP, FUSION, LEVEL 2;  Surgeon: Clelia Schaumann, MD;  Location: [facility] OR;  Service: Orthopedics;  Laterality: N/A;  L4-S1 LAMINECTOMY/FUSION   . REMOVAL, POSTERIOR LUMBAR SPINE HARDWARE N/A  05/20/2018    Procedure: REMOVAL POSTERIOR LUMBAR SPINE HARDWARE L4-S1;  Surgeon: Clelia Schaumann, MD;  Location: [facility];  Service: Orthopedics;  Laterality: N/A;  REMOVAL OF HARDWARE LUMBAR SPINE   . SUPRAPUBIC PROSTATECTOMY  2007   . TONSILLECTOMY  as child   . VASECTOMY  1980's         Imaging/Tests/Labs:  Fluoroscopy Less Than 1 Hour    Result Date: 05/21/2018   Fluoroscopic guidance. Janina Mayo, MD 05/21/2018 8:13 AM      Social History:   Prior Level of Function:  Prior level of function: Independent with ADLs, Ambulates independently  Baseline Activity Level: Community ambulation  Driving: independent  Dressing - Upper Body: supervision  DME Currently at Home: Single point cane, Front wheel walker    Home Living Arrangements:  Living Arrangements: Alone (will now go live with daughter.)  Type of Home: House  Home Layout: Multi-level  Bathroom Shower/Tub: Medical sales representative: Standard  DME Currently at Home: Single point cane, Front wheel walker      Subjective:I am ok.. I am afraid of the dog barking at me     Patient is agreeable to participation in the therapy session.     Pain:4/10  Spine  Pre medicated via nsg    Patient goal- home              Objective:        Observation of Patient/Vital Signs:  Patient is in bed with dressings, SCD's and peripheral IV in place.    Cognitive Status and Neuro Exam:          intact       Musculoskeletal Examination  Gross ROM  Right Upper Extremity ROM: within functional limits  Left Upper Extremity ROM: within functional limits    Gross Strength  Right Upper Extremity Strength: within functional limits  Left Upper Extremity Strength: within functional limits              Sensory/Oculomotor Examination     touch- intact   Vision- glasses       Activities of Daily Living  Self-care and Home Management  Eating: Setup  Grooming: setup  Bathing: Minimal Assist  UB Dressing: set up  LB Dressing: Minimal Assist  Toileting: Supervision  Functional  Transfers: Supervision    Functional Mobility:  Mobility and Transfers  Supine to Sit: Supervision  Sit to Supine: Supervision  Sit to Stand: Supervision  Bed to Chair: Supervision     PMP Activity: Step 6 - Walks in Room     Balance  Balance  Static Sitting Balance: good  Dyanamic Sitting Balance: fair  Static Standing Balance:  fair    Participation and Activity Tolerance  Participation and Endurance  Participation Effort: good  Endurance: Tolerates 10 - 20 min exercise with multiple rests    Patient left with call bell within reach, all needs met, SCDs on, fall mat on, bed alarm off, chair alarm off and all questions answered. RN notified of session outcome and patient response.       Goals:  Time For Goal Achievement: 5 visits  ADL Goals  Patient will groom self: Supervision, at sinkside  Patient will dress lower body: Supervision, with AE  Mobility and Transfer Goals  Pt will perform functional transfers: Supervision                                Time of treatment:   OT Received On: 05/21/18  Start Time: 1312  Stop Time: 1355  Time Calculation (min): 43 min          Tora Perches OTR/L  Pager [phone]

## 2018-05-21 NOTE — Plan of Care (Signed)
Problem: Lumbar Discectomy/Laminectomy  Goal: Hemodynamic stability  Outcome: Adequate for Discharge    Goal: Stable neurovascular status  Outcome: Adequate for Discharge    Goal: Free from infection  Outcome: Adequate for Discharge    Goal: Pain at adequate level as identified by patient  Outcome: Adequate for Discharge    Goal: Patient will maintain normal GI status  Outcome: Adequate for Discharge    Goal: Mobility/activity is maintained at optimum level for patient  Outcome: Adequate for Discharge    Goal: Patient/patient care companion demonstrates understanding of disease process, treatment plan, medications, and discharge plan  Outcome: Completed Date Met: 05/21/18  Discharge instructions explained to patient and daughter. Daughter to transport home. rx to be electronically sent to [facility] on [address].

## 2018-05-22 NOTE — Op Note (Signed)
Procedure Date: 05/20/2018     Patient Type: I     SURGEON: Clelia Schaumann MD  ASSISTANT:       FIRST ASSISTANT:  Daralene Milch, SA     PREOPERATIVE DIAGNOSES:  1.  Painful orthopedic hardware, L4 to S1.  2.  Status post laminectomy fusion, L4 to S1.     POSTOPERATIVE DIAGNOSES:  1.  Painful orthopedic hardware, L4 to S1.  2.  Status post laminectomy fusion, L4 to S1.     TITLE OF PROCEDURE:  1.  Removal of hardware L4 to S1.  2.  Exploration of fusion, L4 to S1.     ANESTHESIA:  General.     COMPLICATIONS:  None.     DRAINS:  None.     INDICATIONS FOR PROCEDURE:  The patient is a 76 year old gentleman who previously underwent a  successful laminectomy and fusion L4 through S1.  Here on the ventilator  initially did well but has developed increasing severe back pain, which has  become incapacitating.  His workup reveals the hardware in good position  with what appears to be well-healed fusion.  He was brought to the  operating room now for removal of hardware.  Preoperatively, the patient  was counseled regarding indications and potential complication of  procedure.     DESCRIPTION OF PROCEDURE:  The patient received prophylactic antibiotics in the holding area.  He was  brought to the operating room.  After the induction of anesthesia,  sequential compression devices were applied to both legs and patient was  hooked up for neurological monitoring.  He was then carefully placed in the  prone position on [place] table.  Care was taken to ensure the abdomen was  free of compression and all bony prominences well padded.  The back was  prepped and draped in usual fashion.     A vertical midline incision was made utilizing the patient's prior incision  partially.  Dissection carried sharply through skin and subcutaneous  tissue.  The fascia was then incised over the hardware which was palpable  bilaterally.  Attention was directed first to the right side.  The fascial  incision was made.  The hardware was then mobilized  as was the fusion mass,  which was abundantly evident.  The patient clearly had a solid  posterolateral fusion mass.  To remove the hardware it required utilizing  the Midas Rex to remove some bone from over the hardware.  Once this  hardware was well exposed, the set screws were removed followed by removal  of rods, followed by removal of the pedicle screws.  The screws were  stimulated prior to the removal and showed no evidence of nerve root  irritation with a threshold of over 20 milliamps.  The pedicle screws were  removed.  They were all following in position with no evidence of  loosening.  Once the screws were removed, the screw entry sites were packed  with bone wax achieving good hemostasis and again the fusion mass remained  intact.  This was repeated on the left side within the hardware being  exposed in the same fashion, then removal of the set screws followed by  removal of the rod, followed by stimulation of the screws with no evidence  of nerve root irritation and removal of the screws themselves.  Again, the  fusion mass was quite solid and intact.  Once the screws were removed, the  pedicle screw entry sites were packed with bone wax for  hemostasis.  The  wound was copiously irrigated.  The fascia was repaired with #1 Vicryl in a  running stitch bilaterally.  The subcutaneous tissue was repaired with 2-0  Vicryl and the skin was closed in a subcuticular fashion augmented with  Steri-Strips.  The incision was injected with a solution of 0.5% Marcaine  with epinephrine after which a sterile dressing was applied.  The patient  tolerated the procedure well.  There were no complications.  He was  transferred to his bed and then to recovery room without incident.           D:  05/21/2018 23:51 PM by Dr. Edman Circle. Marjo Bicker, MD [phone])  T:  05/22/2018 04:23 AM by NTS      (Conf: [id number]) (Doc ID: [id number])

## 2018-05-25 ENCOUNTER — Emergency Department: Payer: Medicare Other

## 2018-05-25 ENCOUNTER — Inpatient Hospital Stay
Admission: EM | Admit: 2018-05-25 | Discharge: 2018-05-29 | DRG: 895 | Disposition: A | Discharge status: Skilled Nursing Facility | Payer: Medicare Other | Attending: Internal Medicine | Admitting: Internal Medicine

## 2018-05-25 DIAGNOSIS — G8929 Other chronic pain: Secondary | ICD-10-CM | POA: Diagnosis present

## 2018-05-25 DIAGNOSIS — I10 Essential (primary) hypertension: Secondary | ICD-10-CM | POA: Diagnosis present

## 2018-05-25 DIAGNOSIS — F419 Anxiety disorder, unspecified: Secondary | ICD-10-CM | POA: Diagnosis present

## 2018-05-25 DIAGNOSIS — F1010 Alcohol abuse, uncomplicated: Principal | ICD-10-CM | POA: Diagnosis present

## 2018-05-25 DIAGNOSIS — F10939 Alcohol use, unspecified with withdrawal, unspecified: Secondary | ICD-10-CM | POA: Diagnosis present

## 2018-05-25 DIAGNOSIS — E785 Hyperlipidemia, unspecified: Secondary | ICD-10-CM | POA: Diagnosis present

## 2018-05-25 DIAGNOSIS — Z9049 Acquired absence of other specified parts of digestive tract: Secondary | ICD-10-CM

## 2018-05-25 DIAGNOSIS — N400 Enlarged prostate without lower urinary tract symptoms: Secondary | ICD-10-CM | POA: Diagnosis present

## 2018-05-25 DIAGNOSIS — Z981 Arthrodesis status: Secondary | ICD-10-CM

## 2018-05-25 DIAGNOSIS — F10930 Alcohol use, unspecified with withdrawal, uncomplicated: Secondary | ICD-10-CM

## 2018-05-25 DIAGNOSIS — Z86711 Personal history of pulmonary embolism: Secondary | ICD-10-CM

## 2018-05-25 DIAGNOSIS — Y909 Presence of alcohol in blood, level not specified: Secondary | ICD-10-CM | POA: Diagnosis present

## 2018-05-25 DIAGNOSIS — F10239 Alcohol dependence with withdrawal, unspecified: Secondary | ICD-10-CM

## 2018-05-25 DIAGNOSIS — I169 Hypertensive crisis, unspecified: Secondary | ICD-10-CM | POA: Diagnosis present

## 2018-05-25 DIAGNOSIS — G9341 Metabolic encephalopathy: Secondary | ICD-10-CM | POA: Diagnosis present

## 2018-05-25 DIAGNOSIS — K219 Gastro-esophageal reflux disease without esophagitis: Secondary | ICD-10-CM | POA: Diagnosis present

## 2018-05-25 HISTORY — DX: Alcohol dependence with withdrawal, unspecified: F10.239

## 2018-05-25 HISTORY — DX: Alcohol use, unspecified with withdrawal, unspecified: F10.939

## 2018-05-25 LAB — BASIC METABOLIC PANEL
Anion Gap: 12 (ref 5.0–15.0)
BUN: 16 mg/dL (ref 9–28)
CO2: 28 mEq/L (ref 22–29)
Calcium: 9.6 mg/dL (ref 7.9–10.2)
Chloride: 100 mEq/L (ref 100–111)
Creatinine: 0.9 mg/dL (ref 0.7–1.3)
Glucose: 104 mg/dL — ABNORMAL HIGH (ref 70–100)
Potassium: 4.3 mEq/L (ref 3.5–5.1)
Sodium: 140 mEq/L (ref 136–145)

## 2018-05-25 LAB — URINALYSIS WITH MICROSCOPIC
Bilirubin, UA: NEGATIVE
Blood, UA: NEGATIVE
Glucose, UA: NEGATIVE
Ketones UA: NEGATIVE
Leukocyte Esterase, UA: NEGATIVE
Nitrite, UA: NEGATIVE
Protein, UR: NEGATIVE
Specific Gravity UA: 1.006 (ref 1.001–1.035)
Urine pH: 7 (ref 5.0–8.0)
Urobilinogen, UA: NORMAL mg/dL

## 2018-05-25 LAB — ACETAMINOPHEN LEVEL: Acetaminophen Level: <7 ug/mL — ABNORMAL LOW (ref 10–30)

## 2018-05-25 LAB — HEPATIC FUNCTION PANEL
ALT: 21 U/L (ref 0–55)
AST (SGOT): 14 U/L (ref 5–34)
Albumin/Globulin Ratio: 1.6 (ref 0.9–2.2)
Albumin: 3.8 g/dL (ref 3.5–5.0)
Alkaline Phosphatase: 113 U/L — ABNORMAL HIGH (ref 38–106)
Bilirubin Direct: 0.4 mg/dL (ref 0.0–0.5)
Bilirubin Indirect: 0.8 mg/dL (ref 0.2–1.0)
Bilirubin, Total: 1.2 mg/dL (ref 0.2–1.2)
Globulin: 2.4 g/dL (ref 2.0–3.6)
Protein, Total: 6.2 g/dL (ref 6.0–8.3)

## 2018-05-25 LAB — SALICYLATE LEVEL: Salicylate Level: <5 mg/dL — ABNORMAL LOW (ref 15.0–30.0)

## 2018-05-25 LAB — CBC AND DIFFERENTIAL
Absolute NRBC: 0 10*3/uL (ref 0.00–0.00)
Basophils Absolute Automated: 0.03 10*3/uL (ref 0.00–0.08)
Basophils Automated: 0.4 %
Eosinophils Absolute Automated: 0.19 10*3/uL (ref 0.00–0.44)
Eosinophils Automated: 2.3 %
Hematocrit: 39.7 % (ref 37.6–49.6)
Hgb: 13.4 g/dL (ref 12.5–17.1)
Immature Granulocytes Absolute: 0.08 10*3/uL — ABNORMAL HIGH (ref 0.00–0.07)
Immature Granulocytes: 1 %
Lymphocytes Absolute Automated: 2.35 10*3/uL (ref 0.42–3.22)
Lymphocytes Automated: 28.3 %
MCH: 31.5 pg (ref 25.1–33.5)
MCHC: 33.8 g/dL (ref 31.5–35.8)
MCV: 93.2 fL (ref 78.0–96.0)
MPV: 9.1 fL (ref 8.9–12.5)
Monocytes Absolute Automated: 0.69 10*3/uL (ref 0.21–0.85)
Monocytes: 8.3 %
Neutrophils Absolute: 4.96 10*3/uL (ref 1.10–6.33)
Neutrophils: 59.7 %
Nucleated RBC: 0 /100 WBC (ref 0.0–0.0)
Platelets: 280 10*3/uL (ref 142–346)
RBC: 4.26 10*6/uL (ref 4.20–5.90)
RDW: 12 % (ref 11–15)
WBC: 8.3 10*3/uL (ref 3.10–9.50)

## 2018-05-25 LAB — RAPID DRUG SCREEN, URINE
Barbiturate Screen, UR: NEGATIVE
Benzodiazepine Screen, UR: NEGATIVE
Cannabinoid Screen, UR: NEGATIVE
Cocaine, UR: NEGATIVE
Opiate Screen, UR: POSITIVE — AB
PCP Screen, UR: NEGATIVE
Urine Amphetamine Screen: NEGATIVE

## 2018-05-25 LAB — PHOSPHORUS: Phosphorus: 3.8 mg/dL (ref 2.3–4.7)

## 2018-05-25 LAB — PT AND APTT
PT INR: 0.9 (ref 0.9–1.1)
PT: 12.1 s — ABNORMAL LOW (ref 12.6–15.0)
PTT: 29 s (ref 23–37)

## 2018-05-25 LAB — TROPONIN I: Troponin I: 0.01 ng/mL (ref 0.00–0.09)

## 2018-05-25 LAB — ETHANOL: Alcohol: NOT DETECTED mg/dL

## 2018-05-25 LAB — GLUCOSE WHOLE BLOOD - POCT: Whole Blood Glucose POCT: 99 mg/dL (ref 70–100)

## 2018-05-25 LAB — GFR: EGFR: >60

## 2018-05-25 LAB — CK: Creatine Kinase (CK): 133 U/L (ref 47–267)

## 2018-05-25 LAB — MAGNESIUM: Magnesium: 1.6 mg/dL (ref 1.6–2.6)

## 2018-05-25 LAB — LIPASE: Lipase: 9 U/L (ref 8–78)

## 2018-05-25 MED ORDER — SODIUM CHLORIDE 0.9 % IV BOLUS
500.00 mL | Freq: Once | INTRAVENOUS | Status: AC
Start: 2018-05-25 — End: 2018-05-25
  Administered 2018-05-25: 22:00:00 500 mL via INTRAVENOUS

## 2018-05-25 MED ORDER — LORAZEPAM 2 MG/ML IJ SOLN
1.00 mg | Freq: Once | INTRAMUSCULAR | Status: AC
Start: 2018-05-25 — End: 2018-05-25
  Administered 2018-05-25: 1 mg via INTRAVENOUS
  Filled 2018-05-25: qty 1

## 2018-05-25 MED ORDER — SODIUM CHLORIDE 0.9 % IV BOLUS
1000.00 mL | Freq: Once | INTRAVENOUS | Status: AC
Start: 2018-05-25 — End: 2018-05-26
  Administered 2018-05-26: 1000 mL via INTRAVENOUS

## 2018-05-25 MED ORDER — LORAZEPAM 2 MG/ML IJ SOLN
1.00 mg | Freq: Once | INTRAMUSCULAR | Status: AC
Start: 2018-05-25 — End: 2018-05-25
  Administered 2018-05-25: 22:00:00 1 mg via INTRAVENOUS
  Filled 2018-05-25: qty 1

## 2018-05-25 MED ORDER — FOLIC ACID 5 MG/ML IJ SOLN
Freq: Once | INTRAMUSCULAR | Status: AC
Start: 2018-05-25 — End: 2018-05-25
  Filled 2018-05-25: qty 1000

## 2018-05-25 NOTE — EDIE (Signed)
COLLECTIVE?NOTIFICATION?05/25/2018 20:33?Matthew [name], Matthew [name]?MRN: [id number]    Criteria Met      Narx Scores Alert    Security and Safety  No recent Security Events currently on file    ED Care Guidelines  There are currently no ED Care Guidelines for this patient. Please check your facility's medical records system.      Prescription Monitoring Program  562??- Narcotic Use Score  592??- Sedative Use Score  000??- Stimulant Use Score  430??- Overdose Risk Score  - All Scores range from 000-999 with 75% of the population scoring < 200 and on 1% scoring above 650  - The last digit of the narcotic, sedative, and stimulant score indicates the number of active prescriptions of that type  - Higher Use scores correlate with increased prescribers, pharmacies, mg equiv, and overlapping prescriptions  - Higher Overdose Risk Scores correlate with increased risk of unintentional overdose death   Concerning or unexpectedly high scores should prompt a review of the PMP record; this does not constitute checking PMP for prescribing purposes.      E.[name]. Visit Count (12 mo.)  Facility Visits   [facility] [place] 2   [facility] 1   Total 3   Note: Visits indicate total known visits.      Recent Emergency Department Visit Summary  Date Facility [facility] Type Diagnoses or Chief Complaint   May 25, 2018 [facility] [place] [facility]      Fall      Jan 19, 2018 [facility] [place] [place] [facility]     Jun 07, 2017 [facility] [place] [place] [facility]      PURE HYPERCHOLESTEROLEMIA, UNSPECIFIED      Activity, gardening and landscaping      Garden or yard of unspecified non-institutional (private) residence as the place of occurrence of the external cause      Presence of left artificial hip joint      Contusion of left hip, initial encounter      Essential (primary) hypertension      History of falling      Contusion of lower back and pelvis, initial encounter       Contusion of left lower leg, initial encounter      Fall on same level, unspecified, initial encounter          Recent Inpatient Visit Summary  Date Facility [facility] Type Diagnoses or Chief Complaint   May 20, 2018 [facility] [place] [facility]      Other chronic pain      Lumbago with sciatica, right side      Lumbago with sciatica, left side      Pain due to internal orthopedic prosthetic devices, implants and grafts, initial encounter      Jan 19, 2018 [facility] [place]  [facility]      Poisoning by other opioids, accidental (unintentional), initial encounter      Acute respiratory failure with hypercapnia      Anxiety disorder, unspecified      History of falling      Elevated prostate specific antigen [PSA]      Essential (primary) hypertension      Alcohol dependence, uncomplicated      Hyperlipidemia, unspecified      Benign prostatic hyperplasia without lower urinary tract symptoms      Dorsalgia, unspecified  Care Providers  There are no care providers on record at this time.   Collective Portal  This patient has registered at the [facility] Emergency Department   For more information visit: [email or web address]     PLEASE NOTE:    1.   Any care recommendations and other clinical information are provided as guidelines or for historical purposes only, and providers should exercise their own clinical judgment when providing care.    2.   You may only use this information for purposes of treatment, payment or health care operations activities, and subject to the limitations of applicable Collective Policies.    3.   You should consult directly with the organization that provided a care guideline or other clinical history with any questions about additional information or accuracy or completeness of information provided.    ? 2019 [place], [employer]. -  [email or web address]

## 2018-05-25 NOTE — ED Notes (Signed)
Bed: B23  Expected date:   Expected time:   Means of arrival:   Comments:  440

## 2018-05-25 NOTE — ED Provider Notes (Signed)
[facility] History and Physical Exam     Patient Name: Matthew [name], Matthew [name]  Encounter Date:  05/25/2018  Attending Physician: Elray Mcgregor, MD  Patient DOB:  1941/09/23  MRN:  [id number]  Room:  23/B23    Chief Complaint     Chief Complaint   Patient presents with   . Fall   . Altered Mental Status     History of Presenting Illness     77 y.o. male with h/o syncopal episodes, degenerative lumbar spinal stenosis, spondylolisthesis of lumbar region BIBA p/w sudden onset, resolved fall x2 and AMS occurring earlier today. Pt lives in [place] but came up last week to have hardware removal from his back with Dr. Marjo Bicker at [facility] and has been staying with his daughter post-op. Since 4:15 this AM he had a fall. His daughter heard it and found him unconscious but he quickly woke up and got back in bed. This AM he was alert. He didn't remember the fall, he went to sleep at 2 PM and woke up at 4 PM, went upstairs, and went to sleep again. At 7 PM, his daughter found him altered, hallucinating, and he had another fall as he tried to get up. He thinks he hurt his R knee but no other injury. He is known to drink a lot of EtOH. He has had DT's in the past. He has had no EtOH today but drank it this week. His daughter found a near empty bottle of Missy Sabins. Pt denies this. He also has been taking opioids for pain. It is unclear how many. He also takes sleep meds: possibly Belsomra and/or Ambien.     PMD:  Pcp, [facility], MD    Nursing Notes Review  Nursing Notes were reviewed.     Previous Records Review  Previous records were reviewed to the extent practicable for the current presentation.    Nursing (triage) note reviewed for the following pertinent information:  Pt BIBA for Fall x 2 and altered mental status. R knee paind and LLback pain      Physical Exam     Vital Signs  BP (!) 186/93   Pulse (!) 116   Temp 99 F (37.2 C) (Oral)   Resp (!) 38   SpO2 95%     Review of Vital Signs  The  patient's vital signs and oxygen saturation were reviewed and interpreted by me, Elray Mcgregor, MD.    Physical Exam    Constitutional: No acute distress. Coloration not ashen.   Eyes: No conjunctival discharge. EOMI.   Head, Ears, Nose, Mouth, Throat: Normocephalic. Moist mucous membranes.   Neck: Full range of motion. No obvious neck deformities. No tracheal deviation.   Cardiovascular: Tachycardic in the 130s. Hypertensive in the 200s/120s. No obvious gallops. Normal capillary refill. Strong peripheral pulses.  Respiratory/Chest: Lungs are clear. No obvious chest wall asymmetry. No resp distress.   Gastrointestinal/Abdominal:  Palpable liver. O/w No tenderness to palpation, no rigidity, no guarding. + bowel sounds. No peritoneal signs, benign exam. Soft abdomen. No abdominal distention.   Musculoskeletal: No leg edema. Normal ROM. No focal extremity tenderness.   He can bear weight on his R knee easily with minimal pain and good knee ROM. Equal strength in all 4 extremities and normal sensation  Back: No deformity. No significant scoliosis.   Neurological: No facial droop. Fully alert but only Ox2. Thinks Andi Hence is president.Very tremulous. Slight speech pressure. No hallucinations currently. No acute focal deficits.  Is able to stand with minimal assistance.   Skin: Lower back incision is healing very well and is not tender or red. Warm skin. No acute rash. Not mottled.     Medical Decision Making     Hx & Exam Synthesis, Differential Diagnosis, Plan     2125 - Imbalance. Tremor. Tachycardia and AMS. This is almost certainly from EtOH withdrawal given the classic presentation and the hx. No signs of stroke. Other medication effects are possible too but most likely. Will check labs, U/A, CXR, and give Ativan, fluids, banana bag. Will probably admit for EtOH withdrawal.     ED Course, Monitors, EKG, Critical Care, Splints, Consults, Reevaluation, etc     EKG: S-Tach at a rate of 120 bpm. Tremor artifact o/w  normal    2245 - His HR is in the 120's. Still hypertensive. Still somewhat tremulous though a little less. Will give more Ativan, more fluids, and admit. His w/u is unremarkable.    2248  - D/w Dr. Daun Peacock, hospitalist, who agrees to admit to med tele inpatient.    Return Precautions    na    Past Medical History     Past Medical History:   Diagnosis Date   . Anxiety 2000   . BPH (benign prostatic hypertrophy) 2007    w/ hx of obstruction - s/p suprapubic prostatectomy   . Disorder of prostate 1999   . Fibromyalgia     in remission   . Gastroesophageal reflux disease    . Hiatal hernia 2019   . History of pancreatitis 2007    had microscopic stones - ERCP w/ stent done   . Hyperlipidemia 1995   . Hypertension     controlled   . 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 Pancreatitis 2014   . Spinal stenosis    . Spondylolisthesis    . Syncopal episodes approx 2012    1x episode - had negative cardiac work-up - no episodes sinice       Medications       Current Facility-Administered Medications:   .  sodium chloride 0.9 % bolus 1,000 mL, 1,000 mL, Intravenous, Once, Elray Mcgregor, MD    Current Outpatient Prescriptions:   .  ALPRAZolam (XANAX) 0.5 MG tablet, Take 0.5 mg by mouth nightly as needed, Disp: , Rfl:   .  amlodipine-benazepril (LOTREL) 10-20 MG per capsule, Take 1 capsule of your 5mg -10mg  dose capsule. This was prescribed by your primacy care provider., Disp: , Rfl:   .  cyclobenzaprine (FLEXERIL) 5 MG tablet, Take 1 tablet (5 mg total) by mouth 3 (three) times daily as needed for Muscle spasms, Disp: 35 tablet, Rfl: 0  .  Diphenoxylate-Atropine (LOMOTIL PO), Take by mouth, Disp: , Rfl:   .  DULoxetine (CYMBALTA) 20 MG capsule, Take 20 mg by mouth daily, Disp: , Rfl:   .  HYDROmorphone (DILAUDID) 2 MG tablet, Take 2 mg by mouth every 4 (four) hours as needed for Pain, Disp: , Rfl:   .  Loperamide HCl (IMODIUM PO), Take by mouth, Disp: , Rfl:   .   Morphine Sulfate ER 15 MG Tablet Extended Release Abuse-Deterrent, Take by mouth daily, Disp: , Rfl:   .  naloxone (NARCAN) 4 MG/0.1ML nasal spray, 1 spray intranasally. If pt does not respond or relapses into respiratory depression call 911. Give additional doses every 2-3 min., Disp: 2 each,  Rfl: 0  .  naproxen sodium (ANAPROX) 220 MG tablet, Take 220 mg by mouth 2 (two) times daily with meals, Disp: , Rfl:   .  oxyCODONE-acetaminophen (PERCOCET) 5-325 MG per tablet, Take 1 tablet by mouth every 4 (four) hours as needed for Pain, Disp: 35 tablet, Rfl: 0  .  pramipexole (MIRAPEX) 0.5 MG tablet, Take 0.5 mg by mouth nightly, Disp: , Rfl:   .  traZODone (DESYREL) 50 MG tablet, Take 50 mg by mouth nightly, Disp: , Rfl:     Allergies     No Known Allergies    Medical history, medications, and allergies reviewed.     Past Surgical History     Past Surgical History:   Procedure Laterality Date   . APPENDECTOMY  1952   . CERVICAL FUSION  1997    states has full ROM of neck   . CHOLECYSTECTOMY  1980   . ERCP, STENT  2007   . LAMINECTOMY, POSTERIOR LUMBAR, DECOMP, FUSION, LEVEL 2 N/A 11/10/2015    Procedure: LAMINECTOMY, POSTERIOR LUMBAR, DECOMP, FUSION, LEVEL 2;  Surgeon: Clelia Schaumann, MD;  Location: [facility];  Service: Orthopedics;  Laterality: N/A;  L4-S1 LAMINECTOMY/FUSION   . REMOVAL, POSTERIOR LUMBAR SPINE HARDWARE N/A 05/20/2018    Procedure: REMOVAL POSTERIOR LUMBAR SPINE HARDWARE L4-S1;  Surgeon: Clelia Schaumann, MD;  Location: [facility];  Service: Orthopedics;  Laterality: N/A;  REMOVAL OF HARDWARE LUMBAR SPINE   . SUPRAPUBIC PROSTATECTOMY  2007   . TONSILLECTOMY  as child   . VASECTOMY  1980's       Family History   The family history is not significantly contributory to current presentation.   History reviewed. No pertinent family history.    Social History   Social history is not significantly contributory to the patient's current presentation.   Social History     Social History   . Marital  status: Divorced     Spouse name: N/A   . Number of children: N/A   . Years of education: N/A     Social History Main Topics   . Smoking status: Never Smoker   . Smokeless tobacco: Never Used   . Alcohol use 1.8 oz/week     3 Glasses of wine per week      Comment: socially   . Drug use: No   . Sexual activity: Not on file     Other Topics Concern   . Not on file     Social History Narrative   . No narrative on file       Review of Systems     See HPI for review of systems that is relevant to the current presentation.   All other systems reviewed: negative.     ED Medications Administered     ED Medication Orders     Start Ordered     Status Ordering Provider    05/25/18 2247 05/25/18 2246  LORazepam (ATIVAN) injection 1 mg  Once     Comments:  This order is post-timed automatically by Epic--but please give this now.   Route: Intravenous  Ordered Dose: 1 mg     Last MAR action:  Given Dajour Pierpoint N    05/25/18 2247 05/25/18 2246  sodium chloride 0.9 % bolus 1,000 mL  Once     Route: Intravenous  Ordered Dose: 1,000 mL     Ordered Marva Hendryx N    05/25/18 2124 05/25/18 2123  sodium chloride 0.9 %  1,000 mL with thiamine 100 mg, folic acid 1 mg, m.v.i. adult 10 mL infusion  Once     Route: Intravenous     Last MAR action:  New Bag Mccauley Diehl N    05/25/18 2124 05/25/18 2123  LORazepam (ATIVAN) injection 1 mg  Once     Comments:  This order is post-timed automatically by Epic--but please give this now.   Route: Intravenous  Ordered Dose: 1 mg     Last MAR action:  Given Lonn Im N    05/25/18 2036 05/25/18 2035  sodium chloride 0.9 % bolus 500 mL  Once     Route: Intravenous  Ordered Dose: 500 mL     Last MAR action:  Stopped Yafet Cline N          Orders Placed During This Encounter     Orders Placed This Encounter   Procedures   . XR Chest  AP Portable   . XR Knee 1 Or 2 Views Right   . Basic Metabolic Panel   . CBC and differential   . GFR   . Urinalysis with microscopic   .  Rapid drug screen, urine   . PT/APTT   . Troponin I   . Ethanol (Alcohol) Level   . Salicylate level   . Acetaminophen level   . Hepatic function panel (LFT)   . Lipase   . Magnesium   . Phosphorus   . Creatine Kinase (CK)   . Glucose POC   . Orthostatic Vital signs   . Cardiac monitoring (ED ONLY)   . Vital signs   . Vital signs   . Vital signs   . Contact isolation status   . Glucose Whole Blood - POCT   . ECG 12 lead   . Saline lock IV   . Admit to Inpatient   . [facility] Bed Request (Inpatient)       Diagnostic Study Results     The results of the diagnostic studies below were reviewed by the ED provider:    Labs  Results     Procedure Component Value Units Date/Time    PT/APTT [[id number]]  (Abnormal) Collected:  05/25/18 2057     Updated:  05/25/18 2217     PT 12.1 (L) sec      PT INR 0.9     PT Anticoag. Given Within 48 hrs. None     PTT 29 sec     Rapid drug screen, urine [[id number]]  (Abnormal) Collected:  05/25/18 2131    Specimen:  Urine Updated:  05/25/18 2205     Amphetamine Screen, UR Negative     Barbiturate Screen, UR Negative     Benzodiazepine Screen, UR Negative     Cannabinoid Screen, UR Negative     Cocaine, UR Negative     Opiate Screen, UR Positive (A)     PCP Screen, UR Negative    Troponin I [[id number]] Collected:  05/25/18 2057     Updated:  05/25/18 2151     Troponin I 0.01 ng/mL     Urinalysis with microscopic [[id number]] Collected:  05/25/18 2131    Specimen:  Urine Updated:  05/25/18 2148     Urine Type Clean Catch     Color, UA Straw     Clarity, UA Clear     Specific Gravity UA 1.006     Urine pH 7.0     Leukocyte Esterase, UA Negative     Nitrite, UA  Negative     Protein, UR Negative     Glucose, UA Negative     Ketones UA Negative     Urobilinogen, UA Normal mg/dL      Bilirubin, UA Negative     Blood, UA Negative     RBC, UA 3 - 5 /hpf      WBC, UA 0 - 5 /hpf     Ethanol (Alcohol) Level [[id number]] Collected:  05/25/18 2057     Updated:  05/25/18 2145     Alcohol None Detected  mg/dL     Salicylate level [[id number]]  (Abnormal) Collected:  05/25/18 2057     Updated:  05/25/18 2145     Salicylate Level <5.0 (L) mg/dL     Acetaminophen level [[id number]]  (Abnormal) Collected:  05/25/18 2057     Updated:  05/25/18 2145     Acetaminophen Level <7 (L) ug/mL     Hepatic function panel (LFT) [[id number]]  (Abnormal) Collected:  05/25/18 2057     Updated:  05/25/18 2145     Bilirubin, Total 1.2 mg/dL      Bilirubin, Direct 0.4 mg/dL      Bilirubin, Indirect 0.8 mg/dL      AST (SGOT) 14 U/L      ALT 21 U/L      Alkaline Phosphatase 113 (H) U/L      Protein, Total 6.2 g/dL      Albumin 3.8 g/dL      Globulin 2.4 g/dL      Albumin/Globulin Ratio 1.6    Lipase [[id number]] Collected:  05/25/18 2057     Updated:  05/25/18 2145     Lipase 9 U/L     Magnesium [[id number]] Collected:  05/25/18 2057     Updated:  05/25/18 2145     Magnesium 1.6 mg/dL     Phosphorus [[id number]] Collected:  05/25/18 2057     Updated:  05/25/18 2145     Phosphorus 3.8 mg/dL     Creatine Kinase (CK) [[id number]] Collected:  05/25/18 2057     Updated:  05/25/18 2145     Creatine Kinase (CK) 133 U/L     Basic Metabolic Panel [[id number]]  (Abnormal) Collected:  05/25/18 2057    Specimen:  Blood Updated:  05/25/18 2145     Glucose 104 (H) mg/dL      BUN 16 mg/dL      Creatinine 0.9 mg/dL      Calcium 9.6 mg/dL      Sodium 188 mEq/L      Potassium 4.3 mEq/L      Chloride 100 mEq/L      CO2 28 mEq/L      Anion Gap 12.0    GFR [[id number]] Collected:  05/25/18 2057     Updated:  05/25/18 2145     EGFR >60.0    Glucose Whole Blood - POCT [[id number]] Collected:  05/25/18 2054     Updated:  05/25/18 2141     POCT - Glucose Whole blood 99 mg/dL     CBC and differential [[id number]]  (Abnormal) Collected:  05/25/18 2057    Specimen:  Blood from Blood Updated:  05/25/18 2105     WBC 8.30 x10 3/uL      Hgb 13.4 g/dL      Hematocrit 27.0 %      Platelets 280 x10 3/uL      RBC 4.26 x10 6/uL      MCV 93.2 fL      MCH  31.5 pg      MCHC 33.8 g/dL       RDW 12 %      MPV 9.1 fL      Neutrophils 59.7 %      Lymphocytes Automated 28.3 %      Monocytes 8.3 %      Eosinophils Automated 2.3 %      Basophils Automated 0.4 %      Immature Granulocyte 1.0 %      Nucleated RBC 0.0 /100 WBC      Neutrophils Absolute 4.96 x10 3/uL      Abs Lymph Automated 2.35 x10 3/uL      Abs Mono Automated 0.69 x10 3/uL      Abs Eos Automated 0.19 x10 3/uL      Absolute Baso Automated 0.03 x10 3/uL      Absolute Immature Granulocyte 0.08 (H) x10 3/uL      Absolute NRBC 0.00 x10 3/uL           Radiologic Studies  Radiology Results (24 Hour)     Procedure Component Value Units Date/Time    XR Chest  AP Portable [[id number]] Collected:  05/25/18 2220    Order Status:  Completed Updated:  05/25/18 2227    Narrative:       HISTORY: Tachycardia, altered mental status.    COMPARISON: 11/14/2015.    FINDINGS:  A single frontal portable chest radiograph is provided.    Mild linear and patchy opacity at the right lung base is unchanged and  may represent there is no pulmonary edema. No pleural effusion or  pneumothorax is seen. Atelectasis or scarring. No other focal  consolidation is identified.    The cardiac and mediastinal contours are unchanged.    Cervical spine fusion hardware is noted. Spinal stimulator electrodes  are partially visualized.      Impression:         Right basilar opacity, also seen previously and likely representing  atelectasis and/or scarring. No new acute process is identified.    Carla Drape, MD   05/25/2018 10:23 PM    XR Knee 1 Or 2 Views Right [[id number]] Collected:  05/25/18 2217    Order Status:  Completed Updated:  05/25/18 2224    Narrative:       HISTORY: Pain, fall.    COMPARISON: 11/14/2015.    FINDINGS:  Frontal and crosstable lateral views of the right knee are provided.    No acute displaced fracture is identified. The alignment is normal.  There are moderate tricompartmental degenerative changes of the knee,  including joint space narrowing most pronounced in  the medial  compartment and chondrocalcinosis. There is a small suprapatellar joint  effusion. The visualized soft tissues are otherwise unremarkable.      Impression:         No evidence of acute displaced fracture or dislocation.    Degenerative changes, as above.    Small knee joint effusion.    Carla Drape, MD   05/25/2018 10:20 PM          Scribe and MD Attestations     Rendering Provider: Elray Mcgregor, MD    Diagnosis and Disposition     Clinical Impression  1. 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

## 2018-05-25 NOTE — ED Triage Notes (Signed)
Pt BIBA for Fall x 2 and altered mental status. Per EMS Pt had fist first fall 1 day ago and again tonight PTA. Pt has Hx of back surgery on Tuesday. Daughter reports Pt mental status was noticed around 4:15 yesterday after fall. Pt is currently A&O x 4, admits to R knee pain and LLback pain. Per EMS stroke scale was negative, dexi 118. Pt admits hitting head and left shoulder when he fell but denies LOC

## 2018-05-26 ENCOUNTER — Inpatient Hospital Stay: Payer: Medicare Other

## 2018-05-26 LAB — ECG 12-LEAD
Atrial Rate: 120 {beats}/min
P Axis: 57 degrees
P-R Interval: 164 ms
Q-T Interval: 294 ms
QRS Duration: 72 ms
QTC Calculation (Bezet): 415 ms
R Axis: -10 degrees
T Axis: 45 degrees
Ventricular Rate: 120 {beats}/min

## 2018-05-26 LAB — CBC AND DIFFERENTIAL
Absolute NRBC: 0 10*3/uL (ref 0.00–0.00)
Basophils Absolute Automated: 0.02 10*3/uL (ref 0.00–0.08)
Basophils Automated: 0.3 %
Eosinophils Absolute Automated: 0.18 10*3/uL (ref 0.00–0.44)
Eosinophils Automated: 2.6 %
Hematocrit: 38.1 % (ref 37.6–49.6)
Hgb: 12.9 g/dL (ref 12.5–17.1)
Immature Granulocytes Absolute: 0.06 10*3/uL (ref 0.00–0.07)
Immature Granulocytes: 0.9 %
Lymphocytes Absolute Automated: 1.88 10*3/uL (ref 0.42–3.22)
Lymphocytes Automated: 27.5 %
MCH: 31.7 pg (ref 25.1–33.5)
MCHC: 33.9 g/dL (ref 31.5–35.8)
MCV: 93.6 fL (ref 78.0–96.0)
MPV: 9.8 fL (ref 8.9–12.5)
Monocytes Absolute Automated: 0.78 10*3/uL (ref 0.21–0.85)
Monocytes: 11.4 %
Neutrophils Absolute: 3.91 10*3/uL (ref 1.10–6.33)
Neutrophils: 57.3 %
Nucleated RBC: 0 /100 WBC (ref 0.0–0.0)
Platelets: 235 10*3/uL (ref 142–346)
RBC: 4.07 10*6/uL — ABNORMAL LOW (ref 4.20–5.90)
RDW: 12 % (ref 11–15)
WBC: 6.83 10*3/uL (ref 3.10–9.50)

## 2018-05-26 LAB — BASIC METABOLIC PANEL
Anion Gap: 8 (ref 5.0–15.0)
BUN: 13 mg/dL (ref 9–28)
CO2: 29 mEq/L (ref 22–29)
Calcium: 9.1 mg/dL (ref 7.9–10.2)
Chloride: 104 mEq/L (ref 100–111)
Creatinine: 0.8 mg/dL (ref 0.7–1.3)
Glucose: 102 mg/dL — ABNORMAL HIGH (ref 70–100)
Potassium: 4.2 mEq/L (ref 3.5–5.1)
Sodium: 141 mEq/L (ref 136–145)

## 2018-05-26 LAB — MAGNESIUM: Magnesium: 1.5 mg/dL — ABNORMAL LOW (ref 1.6–2.6)

## 2018-05-26 LAB — GFR: EGFR: >60

## 2018-05-26 LAB — PHOSPHORUS: Phosphorus: 3.2 mg/dL (ref 2.3–4.7)

## 2018-05-26 MED ORDER — ACETAMINOPHEN 325 MG PO TABS
650.00 mg | ORAL_TABLET | ORAL | Status: DC | PRN
Start: 2018-05-26 — End: 2018-05-29

## 2018-05-26 MED ORDER — TRAZODONE HCL 50 MG PO TABS
50.00 mg | ORAL_TABLET | Freq: Every evening | ORAL | Status: DC
Start: 2018-05-26 — End: 2018-05-29
  Administered 2018-05-26 – 2018-05-28 (×3): 50 mg via ORAL
  Filled 2018-05-26 (×3): qty 1

## 2018-05-26 MED ORDER — DEXTROSE-NACL 5-0.45 % IV SOLN
INTRAVENOUS | Status: DC
Start: 2018-05-26 — End: 2018-05-26

## 2018-05-26 MED ORDER — ACETAMINOPHEN 325 MG PO TABS
650.0000 mg | ORAL_TABLET | Freq: Four times a day (QID) | ORAL | Status: DC | PRN
Start: 2018-05-26 — End: 2018-05-26

## 2018-05-26 MED ORDER — CHLORDIAZEPOXIDE HCL 25 MG PO CAPS
25.00 mg | ORAL_CAPSULE | ORAL | Status: DC
Start: 2018-05-28 — End: 2018-05-26

## 2018-05-26 MED ORDER — CARBOXYMETHYLCELLULOSE SODIUM 0.5 % OP SOLN
1.00 [drp] | Freq: Three times a day (TID) | OPHTHALMIC | Status: DC | PRN
Start: 2018-05-26 — End: 2018-05-29
  Administered 2018-05-26: 1 [drp] via OPHTHALMIC
  Filled 2018-05-26: qty 1

## 2018-05-26 MED ORDER — ONDANSETRON 4 MG PO TBDP
4.00 mg | ORAL_TABLET | Freq: Four times a day (QID) | ORAL | Status: DC | PRN
Start: 2018-05-26 — End: 2018-05-29

## 2018-05-26 MED ORDER — DOCUSATE SODIUM 100 MG PO CAPS
100.00 mg | ORAL_CAPSULE | Freq: Two times a day (BID) | ORAL | Status: DC | PRN
Start: 2018-05-26 — End: 2018-05-29

## 2018-05-26 MED ORDER — DULOXETINE HCL 20 MG PO CPEP
20.00 mg | ORAL_CAPSULE | Freq: Every day | ORAL | Status: DC
Start: 2018-05-26 — End: 2018-05-29
  Administered 2018-05-26 – 2018-05-29 (×4): 20 mg via ORAL
  Filled 2018-05-26 (×4): qty 1

## 2018-05-26 MED ORDER — LORAZEPAM 2 MG/ML IJ SOLN
1.00 mg | INTRAMUSCULAR | Status: DC | PRN
Start: 2018-05-26 — End: 2018-05-29
  Administered 2018-05-26 (×3): 2 mg via INTRAVENOUS
  Administered 2018-05-26: 03:00:00 1 mg via INTRAVENOUS
  Filled 2018-05-26 (×4): qty 1

## 2018-05-26 MED ORDER — DEXTROSE-NACL 5-0.9 % IV SOLN
INTRAVENOUS | Status: DC
Start: 2018-05-26 — End: 2018-05-28
  Administered 2018-05-27: 05:00:00 1000 mL via INTRAVENOUS

## 2018-05-26 MED ORDER — MAGNESIUM SULFATE IN D5W 1-5 GM/100ML-% IV SOLN
1.00 g | Freq: Once | INTRAVENOUS | Status: AC
Start: 2018-05-26 — End: 2018-05-26
  Administered 2018-05-26: 15:00:00 1 g via INTRAVENOUS
  Filled 2018-05-26: qty 100

## 2018-05-26 MED ORDER — ENOXAPARIN SODIUM 40 MG/0.4ML SC SOLN
40.00 mg | Freq: Every day | SUBCUTANEOUS | Status: DC
Start: 2018-05-26 — End: 2018-05-29
  Administered 2018-05-26 – 2018-05-28 (×3): 40 mg via SUBCUTANEOUS
  Filled 2018-05-26 (×3): qty 0.4

## 2018-05-26 MED ORDER — THIAMINE (VITAMIN B1) 100 MG PO TABS (WRAP)
100.00 mg | ORAL_TABLET | Freq: Every day | ORAL | Status: DC
Start: 2018-05-26 — End: 2018-05-29
  Administered 2018-05-26 – 2018-05-29 (×4): 100 mg via ORAL
  Filled 2018-05-26 (×4): qty 1

## 2018-05-26 MED ORDER — ACETAMINOPHEN 650 MG RE SUPP
650.00 mg | RECTAL | Status: DC | PRN
Start: 2018-05-26 — End: 2018-05-29

## 2018-05-26 MED ORDER — CLONIDINE HCL 0.1 MG PO TABS
0.10 mg | ORAL_TABLET | Freq: Four times a day (QID) | ORAL | Status: DC | PRN
Start: 2018-05-26 — End: 2018-05-29

## 2018-05-26 MED ORDER — HYDROMORPHONE HCL 2 MG PO TABS
2.00 mg | ORAL_TABLET | ORAL | Status: DC | PRN
Start: 2018-05-26 — End: 2018-05-29
  Administered 2018-05-26 – 2018-05-29 (×12): 2 mg via ORAL
  Filled 2018-05-26 (×13): qty 1

## 2018-05-26 MED ORDER — HYDRALAZINE HCL 20 MG/ML IJ SOLN
10.00 mg | Freq: Four times a day (QID) | INTRAMUSCULAR | Status: DC | PRN
Start: 2018-05-26 — End: 2018-05-29

## 2018-05-26 MED ORDER — MORPHINE SULFATE ER 15 MG PO TBCR
15.00 mg | EXTENDED_RELEASE_TABLET | Freq: Two times a day (BID) | ORAL | Status: DC
Start: 2018-05-26 — End: 2018-05-26
  Administered 2018-05-26 (×2): 15 mg via ORAL
  Filled 2018-05-26 (×2): qty 1

## 2018-05-26 MED ORDER — LISINOPRIL 10 MG PO TABS
ORAL_TABLET | Freq: Every day | ORAL | Status: DC
Start: 2018-05-26 — End: 2018-05-29
  Filled 2018-05-26 (×8): qty 1

## 2018-05-26 MED ORDER — TAB-A-VITE/BETA CAROTENE PO TABS
1.00 | ORAL_TABLET | Freq: Every day | ORAL | Status: DC
Start: 2018-05-26 — End: 2018-05-29
  Administered 2018-05-26 – 2018-05-29 (×4): 1 via ORAL
  Filled 2018-05-26 (×4): qty 1

## 2018-05-26 MED ORDER — ONDANSETRON 4 MG PO TBDP
4.00 mg | ORAL_TABLET | ORAL | Status: DC | PRN
Start: 2018-05-26 — End: 2018-05-26

## 2018-05-26 MED ORDER — FOLIC ACID 1 MG PO TABS
1.00 mg | ORAL_TABLET | Freq: Every day | ORAL | Status: DC
Start: 2018-05-26 — End: 2018-05-29
  Administered 2018-05-26 – 2018-05-29 (×4): 1 mg via ORAL
  Filled 2018-05-26 (×4): qty 1

## 2018-05-26 MED ORDER — CYCLOBENZAPRINE HCL 10 MG PO TABS
5.00 mg | ORAL_TABLET | Freq: Three times a day (TID) | ORAL | Status: DC | PRN
Start: 2018-05-26 — End: 2018-05-29
  Administered 2018-05-26 – 2018-05-28 (×5): 5 mg via ORAL
  Filled 2018-05-26 (×5): qty 1

## 2018-05-26 MED ORDER — NALOXONE HCL 0.4 MG/ML IJ SOLN (WRAP)
0.20 mg | INTRAMUSCULAR | Status: DC | PRN
Start: 2018-05-26 — End: 2018-05-29

## 2018-05-26 MED ORDER — CHLORDIAZEPOXIDE HCL 25 MG PO CAPS
25.00 mg | ORAL_CAPSULE | Freq: Two times a day (BID) | ORAL | Status: DC
Start: 2018-05-26 — End: 2018-05-26
  Administered 2018-05-26 (×2): 25 mg via ORAL
  Filled 2018-05-26 (×2): qty 1

## 2018-05-26 MED ORDER — ONDANSETRON HCL 4 MG/2ML IJ SOLN
4.00 mg | Freq: Four times a day (QID) | INTRAMUSCULAR | Status: DC | PRN
Start: 2018-05-26 — End: 2018-05-29
  Administered 2018-05-27: 23:00:00 4 mg via INTRAVENOUS
  Filled 2018-05-26: qty 2

## 2018-05-26 NOTE — PT Eval Note (Signed)
[facility]  Physical Therapy Evaluation    Patient: Matthew [name] MRN: [id number]   Unit: [facility]    Bed: K932/I712-45     Attention Physicians:  For patients in an observation or outpatient status, Medicare requires the ordering provider to certify that this service is medically necessary.  Please co-sign this evaluation to indicate your agreement.  Thank you.      SUMMARY: Pt live alone. Used to walk ind with the use of a cane, but unable lately due to falls. Would benefit from acute rehab to promote functional mobility and independence and facilitate safety and return to his home and his work      Recommendation  Discharge Recommendation: Acute Rehab  DME Recommended for Discharge: Front wheel walker  PT - Next Visit Recommendation: 05/27/18  PT Frequency: 4-5x/wk    Recommended mode of transportation at discharge:  car    PMP - Progressive Mobility Protocol   PMP Activity: Step 6 - Walks in Room  Distance Walked (ft) (Step 6,7): 10 Feet      Plan:             Risks/Benefits/POC Discussed with Pt/Family: With patient       Treatment/Interventions: Exercise;Gait training;Functional transfer training;Endurance training;Equipment eval/education;Bed mobility;Continued evaluation         Goals  Goal Formulation: With patient  Time for Goal Acheivement: 5 visits  Pt Will Perform Sit To Supine: independent  Pt Will Perform Sit to Stand: independent  Pt Will Transfer Bed/Chair: with rolling walker;independent  Pt Will Ambulate: 51-100 feet;with rolling walker;independent           Interdisciplinary Communication:   Pt was transported downstairs for CT scan.  Updated white communication board in room with patient's current mobility status . Communicated with attending nurse regarding PT out come  Education:   Educated patient  to role of physical therapy, plan of care, transfer training techniques, falls prevention, gait training techniques with R/W, home safety, next appropriate level of care  Patient   demonstrated good understanding.      Discussed goals of therapy with patient and was in agreement with plan.      Evaluation:   Consult received for Matthew [name] for PT evaluation and treatment.  Chart reviewed.  Patient's medical condition is appropriate for Physical Therapy intervention at this time.     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 male admitted on 05/25/2018 with falls, AMS, tremors, ETOH      Patient Active Problem List   Diagnosis   . Degenerative lumbar spinal stenosis   . Lumbar stenosis   . Spondylolisthesis of lumbar region   . Hypertension   . BPH (benign prostatic hypertrophy)   . History of lumbar laminectomy for spinal cord decompression   . Pulmonary embolism, bilateral   . Painful orthopaedic hardware   . Alcohol withdrawal     Past Medical History:   Diagnosis Date   . Anxiety 2000   . BPH (benign prostatic hypertrophy) 2007    w/ hx of obstruction - s/p suprapubic prostatectomy   . Disorder of prostate 1999   . Fibromyalgia     in remission   . Gastroesophageal reflux disease    . Hiatal hernia  2019   . History of pancreatitis 2007    had microscopic stones - ERCP w/ stent done   . Hyperlipidemia 1995   . Hypertension     controlled   . 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 Pancreatitis 2014   . Spinal stenosis    . Spondylolisthesis    . Syncopal episodes approx 2012    1x episode - had negative cardiac work-up - no episodes sinice     Past Surgical History:   Procedure Laterality Date   . APPENDECTOMY  1952   . CERVICAL FUSION  1997    states has full ROM of neck   . CHOLECYSTECTOMY  1980   . ERCP, STENT  2007   . LAMINECTOMY, POSTERIOR LUMBAR, DECOMP, FUSION, LEVEL 2 N/A  11/10/2015    Procedure: LAMINECTOMY, POSTERIOR LUMBAR, DECOMP, FUSION, LEVEL 2;  Surgeon: Clelia Schaumann, MD;  Location: [facility];  Service: Orthopedics;  Laterality: N/A;  L4-S1 LAMINECTOMY/FUSION   . REMOVAL, POSTERIOR LUMBAR SPINE HARDWARE N/A 05/20/2018    Procedure: REMOVAL POSTERIOR LUMBAR SPINE HARDWARE L4-S1;  Surgeon: Clelia Schaumann, MD;  Location: [facility];  Service: Orthopedics;  Laterality: N/A;  REMOVAL OF HARDWARE LUMBAR SPINE   . SUPRAPUBIC PROSTATECTOMY  2007   . TONSILLECTOMY  as child   . VASECTOMY  1980's               Prior Level of Function  Prior level of function: Ambulates with assistive device  Assistive Device: Single point cane  Baseline Activity Level: Community ambulation  Driving: independent  Employment: PT  DME Currently at Home: Single point cane      Home Living Arrangements  Living Arrangements: Alone  Type of Home: House  Home Layout: One level  Bathroom Shower/Tub: Pension scheme manager: Midwife: Grab bars in shower;Grab bars around toilet  Bathroom Accessibility: Accessible  DME Currently at Home: Single point cane        Subjective: Patient is agreeable to participation in the therapy session. Nursing clears patient for therapy.   Pain Assessment  Pain Assessment:  (6-7/10 low back)          Objective:  Patient is in bed with no medical equipment in place.       Cognition/Neuro Status  Arousal/Alertness:  (all faulties WFL)  Behavior: calm;cooperative        Musculoskeletal Examination  Gross ROM  Right Lower Extremity ROM: within functional limits  Left Lower Extremity ROM: within functional limits                                                          Gross Strength  Right Lower Extremity Strength: within functional limits  Left Lower Extremity Strength: within functional limits                Sensation: intact BLEs        Functional Mobility  Rolling: Independent  Supine to Sit: Independent  Scooting to EOB:  Independent  Sit to Supine: Moderate Assist  Sit to Stand: Minimal Assist      Transfers  Bed to Chair:  (with r/W and CG assist)  Berg Balance Scale         AM-PAC Basic Mobility  Turning Over in Bed: None  Sitting Down On/Standing From Armchair: A little  Lying on Back to Sitting on Side of Bed: A little  Assist Moving to/from Bed to Chair: A little  Assist to Walk in Hospital Room: A little          Locomotion  Ambulation: Contact Guard Assist;Dependent;with crutches  Pattern:  (small, equal, slow , reciprocal steps, slihgt knee flexion)  Stair Management:  (N/A at this time)      Participation and Endurance  Participation Effort: excellent  Endurance:  (poor)           Treatment:   PT eval      Assessment:   Assessment: Decreased endurance/activity tolerance;Decreased functional mobility;Gait impairment      Patient History: Personal factors and pre-existing co-morbidities affecting plan of care include chronis illness    Examination reveals impairments in the follow body systems: pain and function as demonstrated by these objective measures: pain scale and AM-PAC    Clinical presentation is evolving due to testing in progress  Plan of care complexity impacted by situation that requires coordination with other healthcare professional(s)        Prognosis: Good;With continued PT status post acute discharge           MD Co-sign needed:          Time Calculation  PT Received On: 05/26/18  Start Time: 1558  Stop Time: 1610  Time Calculation (min): 12 min          Signature:  Barbaraann Boys, PT

## 2018-05-26 NOTE — Progress Notes (Signed)
[facility]  HOSPITALIST  PROGRESS NOTE      Patient: Matthew [name]  Date: 05/26/2018   LOS: 1 Days  Admission Date: 05/25/2018   MRN: [id number]  Attending: Randall An MD     ASSESSMENT/PLAN     Matthew [name] is a 77 y.o. male PMHx of DJD of spine, chronic back pain, lumbar stenosis s/p spinal fusion L4-S1 2016, and subsequent hardware removal 05/21/18, B/L PE, HTN, Alcohol abuse who presented with falls at home and confusion. Patient with alcohol withdrawal syndrome     1. Alcohol withdrawal syndrome. Pt now more sedated. Will stop Librium taper. Ativan IV PRN based on CIWA. Daily MVI, Thiamine and Folate.  Seizure, aspiration and fall precautions. Monitor electrolytes and correct them  2. Elevated BPs. Likely due to withdrawal sx's. Continue management per # 1. Resume home regimen of BP meds. Also Clonidine prn and hydralazine PRN.   3. Falls at home. PT/OT eval, fall precautions. No obvious trauma on exam. Avoid excessive use of sedatives, opioids  4. Chronic back pain s/p prior L4-S1 fusion in 2016, and subsequent hardware removal 05/21/18 due to persistent back pain. Patient follows with spine surgery outpatient. Hold MS Contin due to lethargy but will continue Dilaudid prn. May need to decrease doses or completely wean off given issues with recurrent falls at home. Surgical wound appears intact, PT/OT   5. HTN. Continue home regimen of BP meds  6. Depression, anxiety. Continue home regimen of cymbalta  7. Encephalopathy: Suspect metabolic due to etoh. Will obtain stat CT head to rule out intracranial bleed. If negative will start chemical DVT ppx   8. Hypomagnesemia: Replete IV       Safety Checklist  DVT prophylaxis: Mechanical   Foley: Not present   IVs:  Peripheral IV   PT/OT: Ordered   Daily CBC & or Chem ordered: Yes, due to clinical and lab instability       Patient Lines/Drains/Airways Status    Active PICC Line / CVC Line / PIV Line / Drain / Airway / Intraosseous Line / Epidural Line / ART Line / Line  / Wound / Pressure Ulcer / NG/OG Tube     Name:   Placement date:   Placement time:   Site:   Days:    Peripheral IV 05/26/18 Left Antecubital  05/26/18        Antecubital    less than 1    Peripheral IV 05/26/18 Right Forearm  05/26/18    0213    Forearm    less than 1                MD/RN rounds: yes        Code Status: full code    DISPO: pending medical course     Family Contact: none    Care Plan discussed with nursing, consultants, case manager.       SUBJECTIVE     Matthew [name] states he has no complaints.     MEDICATIONS     Current Facility-Administered Medications   Medication Dose Route Frequency   . DULoxetine  20 mg Oral Daily   . enoxaparin  40 mg Subcutaneous Daily   . folic acid  1 mg Oral Daily   . lisinopril-amLODIPine 10-5 combo dose   Oral Daily   . magnesium sulfate  1 g Intravenous Once   . multivitamin  1 tablet Oral Daily   . thiamine  100 mg Oral Daily   .  traZODone  50 mg Oral QHS       ROS     Remainder of 10 point ROS as above or otherwise negative    PHYSICAL EXAM     Vitals:    05/26/18 1136   BP: (!) 177/96   Pulse: 86   Resp: 19   Temp: 98.2 F (36.8 C)   SpO2: 94%       Temperature: Temp  Min: 98.2 F (36.8 C)  Max: 99 F (37.2 C)  Pulse: Pulse  Min: 86  Max: 122  Respiratory: Resp  Min: 16  Max: 38  Non-Invasive BP: BP  Min: 162/111  Max: 206/127  Pulse Oximetry SpO2  Min: 94 %  Max: 98 %    Intake and Output Summary (Last 24 hours) at Date Time    Intake/Output Summary (Last 24 hours) at 05/26/18 1509  Last data filed at 05/26/18 0250   Gross per 24 hour   Intake              500 ml   Output              450 ml   Net               50 ml       GEN APPEARANCE: Lethargic , oriented to self only   HEENT: PERLA; EOMI; Conjunctiva Clear  NECK: Supple; No bruits  CVS: RRR, S1, S2; No M/G/R  LUNGS: CTAB; No Wheezes; No Rhonchi: No rales  MSK: spine, well healing surgical incision site, no drainage or erythema  ABD: Soft; No TTP; + Normoactive BS  EXT: No edema; Pulses 2+ and  intact  SKIN: No rash or Lesions  NEURO: CN 2-12 intact; No Focal neurological deficits      LABS       Recent Labs  Lab 05/26/18  0716 05/25/18  2057   WBC 6.83 8.30   RBC 4.07* 4.26   Hgb 12.9 13.4   Hematocrit 38.1 39.7   MCV 93.6 93.2   Platelets 235 280         Recent Labs  Lab 05/26/18  0716 05/25/18  2057   Sodium 141 140   Potassium 4.2 4.3   Chloride 104 100   CO2 29 28   BUN 13 16   Creatinine 0.8 0.9   Glucose 102* 104*   Calcium 9.1 9.6   Magnesium 1.5* 1.6         Recent Labs  Lab 05/25/18  2057   ALT 21   AST (SGOT) 14   Bilirubin, Total 1.2   Bilirubin, Direct 0.4   Albumin 3.8   Alkaline Phosphatase 113*         Recent Labs  Lab 05/25/18  2057   Creatine Kinase (CK) 133   Troponin I 0.01         Recent Labs  Lab 05/25/18  2057   PT INR 0.9   PT 12.1*   PTT 29       Microbiology Results     None           RADIOLOGY     Radiological Procedure personally reviewed and concur with radiologist reports unless stated otherwise.    XR Chest  AP Portable   Final Result      Right basilar opacity, also seen previously and likely representing   atelectasis and/or scarring. No new acute process is identified.      Daleen Bo  Olga Millers, MD    05/25/2018 10:23 PM      XR Knee 1 Or 2 Views Right   Final Result      No evidence of acute displaced fracture or dislocation.      Degenerative changes, as above.      Small knee joint effusion.      Carla Drape, MD    05/25/2018 10:20 PM          Signed,  Randall An MD  3:09 PM 05/26/2018

## 2018-05-26 NOTE — UM Notes (Signed)
** This review is compiled from documentation provided by the Treatment Team within the patient's medical record. **     Darlina Rumpf RN, BSN, CCM  Clinical Case Manager - Utilization Review  [facility]  [address]  [place], [place] [address]    NPI: [id number]  Tax ID: [id number]    Phone: [phone] Option 1  FAX: [phone]    Please use fax number [phone] to provide authorization for hospital services or to request additional information.        PATIENT NAME: [name],[name]   DOB: February 13, 1941      --- 05/25/2018 --- ED - 77 y.o. male with h/o syncopal episodes, degenerative lumbar spinal stenosis, spondylolisthesis of lumbar region BIBA p/w sudden onset, resolved fall x2 and AMS occurring earlier today. Pt lives in [place] but came up last week to have hardware removal from his back with Dr. Marjo Bicker at [facility] and has been staying with his daughter post-op. Since 4:15 this AM he had a fall. His daughter heard it and found him unconscious but he quickly woke up and got back in bed. This AM he was alert. He didn't remember the fall, he went to sleep at 2 PM and woke up at 4 PM, went upstairs, and went to sleep again. At 7 PM, his daughter found him altered, hallucinating, and he had another fall as he tried to get up. He thinks he hurt his R knee but no other injury. He is known to drink a lot of EtOH. He has had DT's in the past. He has had no EtOH today but drank it this week. His daughter found a near empty bottle of Missy Sabins. Pt denies this. He also has been taking opioids for pain. It is unclear how many. He also takes sleep meds: possibly Belsomra and/or Ambien.     ED Triage Vitals   Enc Vitals Group      BP 05/25/18 2049 (!) 162/106      Heart Rate 05/25/18 2049 (!) 121      Resp Rate 05/25/18 2049 18      Temp 05/25/18 2253 99 F (37.2 C)      Temp Source 05/25/18 2253 Oral      SpO2 05/25/18 2049 94 %      Weight 05/26/18 0100 61.7 kg (136 lb 1.6 oz)      Height  05/26/18 0100 1.727 m (5\' 8" )     IVF x 1L given    EKG: S-Tach at a rate of 120 bpm. Tremor artifact o/w normal    2125 - Imbalance. Tremor. Tachycardia and AMS. This is almost certainly from EtOH withdrawal given the classic presentation and the hx. No signs of stroke. Other medication effects are possible too but most likely. Will check labs, U/A, CXR, and give Ativan, fluids, banana bag. Will probably admit for EtOH withdrawal.     PT 12.1 (L) sec     Rapid drug screen, urine [[id number]]  (Abnormal) Collected:  05/25/18 2131     Specimen:  Urine Updated:  05/25/18 2205     Amphetamine Screen, UR Negative     Barbiturate Screen, UR Negative     Benzodiazepine Screen, UR Negative     Cannabinoid Screen, UR Negative     Cocaine, UR Negative     Opiate Screen, UR Positive (A)     PCP Screen, UR Negative     CXR - Right basilar opacity, also seen  previously and likely representing  atelectasis and/or scarring. No new acute process is identified.    Right knee - No evidence of acute displaced fracture or dislocation.    ED Medication Orders     Start        05/25/18 2247  LORazepam (ATIVAN) injection 1 mg  Once     Comments:  This order is post-timed automatically by Epic--but please give this now.   Route: Intravenous  Ordered Dose: 1 mg        05/25/18 2247  sodium chloride 0.9 % bolus 1,000 mL  Once     Route: Intravenous  Ordered Dose: 1,000 mL        05/25/18 2124  sodium chloride 0.9 % 1,000 mL with thiamine 100 mg, folic acid 1 mg, m.v.i. adult 10 mL infusion  Once     Route: Intravenous        05/25/18 2124  LORazepam (ATIVAN) injection 1 mg  Once     Comments:  This order is post-timed automatically by Epic--but please give this now.   Route: Intravenous  Ordered Dose: 1 mg        05/25/18 2036  sodium chloride 0.9 % bolus 500 mL  Once     Route: Intravenous  Ordered Dose: 500 mL         Clinical Impression  1. 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 male with a PMHx of DJD of spine, chronic back pain, lumbar stenosis s/p spinal fusion L4-S1 2016, and subsequent hardware removal 05/21/18, B/L PE, HTN, Alcohol abuse who presented with falls at home and confusion. Patient with alcohol withdrawal syndrome    1. Alcohol withdrawal syndrome - Patient tremulous, restless, and apparently was hallucinating at home, per daughter.  He reports last drink was day before yesterday. Will place on CIWA protocol. Daily MVI, Thiamine and Folate. Low dose librium taper. Seizure, aspiration and fall precautions. Monitor electrolytes and correct them as needed.   2. Elevated BPs - Resume home regimen of BP meds. Also Clonidine prn. Alcohol cessation counseling. Patient not interested in Rehab  3. Falls at home. PT/OT eval, fall precautions. Avoid excessive use of sedatives, opioids  4. Chronic back pain -  Patient follows with spine surgery outpatient. Continue home regimen of pain meds- MS Contin and Dilaudid prn. May need to decrease doses or completely wean off given issues with recurrent falls at home.   5. Surgical wound appears intact, no signs of infection. Will get PT/OT eval and wound care RN.  6. Generalized weakness. Per # 3. May benefit from acute rehab or SNF  7. HTN. Continue home regimen of BP meds  8. Depression, anxiety. Continue home regimen of cymbalta    RD consult - Nutrition Recommendation:  Goal: PO intake >70% of meals.   Plan:              1) Continue current diet. Optimize intake during periods of coherence.               2) Agree with vitamin regimen per CIWA protocol.               3) Will attempt to obtain more detailed nutrition hx during subsequent RD visits if pt more coherent or daughter present.     --------05/26/18------  CIWA score - 16, 18    Vitals:   BP 187/105  HR 94  RR 17  T 98.6 F

## 2018-05-26 NOTE — OT Progress Note (Signed)
Occupational Therapy Note    Made 2nd attempt to see pt for OT eval.  Spoke w/ RN.  Pt currently soundly sleeping.  Will re-attempt as able.    Marilynne Drivers, [place] 05/26/2018 1:18 PM

## 2018-05-26 NOTE — Plan of Care (Signed)
Problem: Safety  Goal: Patient will be free from injury during hospitalization  Outcome: Progressing   05/26/18 1838   Goal/Interventions addressed this shift   Patient will be free from injury during hospitalization  Assess patient's risk for falls and implement fall prevention plan of care per policy;Provide and maintain safe environment;Use appropriate transfer methods;Ensure appropriate safety devices are available at the bedside;Include patient/ family/ care giver in decisions related to safety;Hourly rounding;Assess for patients risk for elopement and implement Elopement Risk Plan per policy;Provide alternative method of communication if needed (communication boards, writing)       Comments: Patient A&O x3, in bed with call bell and belongings in reach, bed is in the lowest position.  VSS, sitter at bedside, CIWA 5-6 will continue to monitor.

## 2018-05-26 NOTE — H&P (Signed)
[facility]      Patient: Matthew [name]  Date: 05/25/2018   DOB: 15-Mar-1941  Admission Date: 05/25/2018   MRN: [id number]  Attending: Andi Hence, MD       Chief Complaint   Patient presents with   . Fall   . Altered Mental Status      History Gathered From: Self and ER records    HISTORY AND PHYSICAL     CLAYDEN WITHEM is a 77 y.o. male with a PMHx of DJD of spine, chronic back pain, lumbar stenosis s/p spinal fusion L4-S1 2016, and subsequent hardware removal 05/21/18, B/L PE, HTN, Alcohol abuse who presented with falls at home and confusion. Please note that patient is poor historian and hence hx obtained from ER records and daughter. Patient suffers from chronic back pain, most recently had spinal hardware removed on 6/12 due to persistent back pain. Since discharge from hospital, he has been staying with his daughter and getting PT services. Patient is a heavy drinker, per daughter, and has fallen at least twice at home since release from hospital recently. Daughter found almost empty bottle of Christiane Ha in patient's room-though patient denies having drank this. He reports he drinks half a glass of wine every day or every other day. Last drink was day before yesterday. Has been having tremors, and feeling anxious. He denies any hallucinations though daughter reported patient was having hallucinations. Patient fell at home yesterday morning, daughter found him on the floor. At 7pm today, daughter found patient confused and hallucinating and had another fall while trying to get up. Patient also on opioids for back pain and also takes a sleeping aid-doesn't remember the name. Currently patient c/o back pain which is not new and denies any other sx's      Past Medical History:   Diagnosis Date   . Anxiety 2000   . BPH (benign prostatic hypertrophy) 2007    w/ hx of obstruction - s/p suprapubic prostatectomy   . Disorder of prostate 1999   . Fibromyalgia     in remission   . Gastroesophageal  reflux disease    . Hiatal hernia 2019   . History of pancreatitis 2007    had microscopic stones - ERCP w/ stent done   . Hyperlipidemia 1995   . Hypertension     controlled   . 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 Pancreatitis 2014   . Spinal stenosis    . Spondylolisthesis    . Syncopal episodes approx 2012    1x episode - had negative cardiac work-up - no episodes sinice       Past Surgical History:   Procedure Laterality Date   . APPENDECTOMY  1952   . CERVICAL FUSION  1997    states has full ROM of neck   . CHOLECYSTECTOMY  1980   . ERCP, STENT  2007   . LAMINECTOMY, POSTERIOR LUMBAR, DECOMP, FUSION, LEVEL 2 N/A 11/10/2015    Procedure: LAMINECTOMY, POSTERIOR LUMBAR, DECOMP, FUSION, LEVEL 2;  Surgeon: Clelia Schaumann, MD;  Location: [facility];  Service: Orthopedics;  Laterality: N/A;  L4-S1 LAMINECTOMY/FUSION   . REMOVAL, POSTERIOR LUMBAR SPINE HARDWARE N/A 05/20/2018    Procedure: REMOVAL POSTERIOR LUMBAR SPINE HARDWARE L4-S1;  Surgeon: Clelia Schaumann, MD;  Location: [facility];  Service: Orthopedics;  Laterality: N/A;  REMOVAL OF HARDWARE LUMBAR SPINE   .  SUPRAPUBIC PROSTATECTOMY  2007   . TONSILLECTOMY  as child   . VASECTOMY  1980's       Prior to Admission medications    Medication Sig Start Date End Date Taking? Authorizing Provider   ALPRAZolam (XANAX) 0.5 MG tablet Take 0.5 mg by mouth nightly as needed    [provider]   amlodipine-benazepril (LOTREL) 10-20 MG per capsule Take 1 capsule of your 5mg -10mg  dose capsule. This was prescribed by your primacy care provider. 12/04/15   Diona Foley, MD   cyclobenzaprine (FLEXERIL) 5 MG tablet Take 1 tablet (5 mg total) by mouth 3 (three) times daily as needed for Muscle spasms 05/21/18   Ricard, Gerome Apley, PA   Diphenoxylate-Atropine (LOMOTIL PO) Take by mouth    [provider]   DULoxetine (CYMBALTA) 20 MG capsule Take 20 mg by mouth daily    [provider]    HYDROmorphone (DILAUDID) 2 MG tablet Take 2 mg by mouth every 4 (four) hours as needed for Pain    [provider]   Loperamide HCl (IMODIUM PO) Take by mouth    [provider]   Morphine Sulfate ER 15 MG Tablet Extended Release Abuse-Deterrent Take by mouth daily    [provider]   naloxone (NARCAN) 4 MG/0.1ML nasal spray 1 spray intranasally. If pt does not respond or relapses into respiratory depression call 911. Give additional doses every 2-3 min. 05/21/18   Ricard, Gerome Apley, PA   naproxen sodium (ANAPROX) 220 MG tablet Take 220 mg by mouth 2 (two) times daily with meals    [provider]   oxyCODONE-acetaminophen (PERCOCET) 5-325 MG per tablet Take 1 tablet by mouth every 4 (four) hours as needed for Pain 05/21/18 05/28/18  Ricard, Gerome Apley, PA   pramipexole (MIRAPEX) 0.5 MG tablet Take 0.5 mg by mouth nightly    [provider]   traZODone (DESYREL) 50 MG tablet Take 50 mg by mouth nightly    [provider]       No Known Allergies    CODE STATUS: full code    PRIMARY CARE MD: Pcp, [facility], MD    History reviewed. No pertinent family history.    Social History   Substance Use Topics   . Smoking status: Never Smoker   . Smokeless tobacco: Never Used   . Alcohol use 1.8 oz/week     3 Glasses of wine per week      Comment: socially       REVIEW OF SYSTEMS     Ten point review of systems negative or as per HPI and below endorsements.    PHYSICAL EXAM     Vital Signs (most recent): BP (!) 196/114   Pulse (!) 106   Temp 99 F (37.2 C) (Oral)   Resp 16   Ht 1.727 m (5\' 8" )   Wt 61.7 kg (136 lb 1.6 oz)   SpO2 98%   BMI 20.69 kg/m   Constitutional: No apparent distress. Tremulous, restless, attempts to get OOB.  Patient speaks freely in full sentences.   HEENT: NC/AT, PERRL, no scleral icterus or conjunctival pallor, no nasal discharge, MMM, oropharynx without erythema or exudate  Neck: trachea midline, supple, no cervical or supraclavicular  lymphadenopathy or masses  Cardiovascular: RRR, normal S1 S2, no murmurs, gallops, palpable thrills, no JVD, Non-displaced PMI.  Respiratory: Normal rate. No retractions or increased work of breathing. Clear to auscultation and percussion bilaterally.  Gastrointestinal: +BS, non-distended, soft,  non-tender, no rebound or guarding, no hepatosplenomegaly  Genitourinary: no suprapubic or costovertebral angle tenderness  Musculoskeletal: ROM and motor strength grossly normal. No clubbing, edema, or cyanosis. DP and radial pulses 2+ and symmetric. Back exam reveals a surgical incision which appears intact with no drainage. Surrounding skin with some patches of erythema  Skin: no rashes, jaundice or other lesions  Neurologic: EOMI, CN 2-12 grossly intact. no gross motor or sensory deficits, patellar and bicep DTR 2+ and symmetric, downward plantar reflexes. +Tremors. Generalized weakness  Psychiatric: AAOx3, affect and mood appropriate. The patient is alert, interactive, appropriate.    LABS & IMAGING     Recent Results (from the past 24 hour(s))   Glucose Whole Blood - POCT    Collection Time: 05/25/18  8:54 PM   Result Value Ref Range    POCT - Glucose Whole blood 99 70 - 100 mg/dL   Basic Metabolic Panel    Collection Time: 05/25/18  8:57 PM   Result Value Ref Range    Glucose 104 (H) 70 - 100 mg/dL    BUN 16 9 - 28 mg/dL    Creatinine 0.9 0.7 - 1.3 mg/dL    Calcium 9.6 7.9 - 13.0 mg/dL    Sodium 865 784 - 696 mEq/L    Potassium 4.3 3.5 - 5.1 mEq/L    Chloride 100 100 - 111 mEq/L    CO2 28 22 - 29 mEq/L    Anion Gap 12.0 5.0 - 15.0   CBC and differential    Collection Time: 05/25/18  8:57 PM   Result Value Ref Range    WBC 8.30 3.10 - 9.50 x10 3/uL    Hgb 13.4 12.5 - 17.1 g/dL    Hematocrit 29.5 28.4 - 49.6 %    Platelets 280 142 - 346 x10 3/uL    RBC 4.26 4.20 - 5.90 x10 6/uL    MCV 93.2 78.0 - 96.0 fL    MCH 31.5 25.1 - 33.5 pg    MCHC 33.8 31.5 - 35.8 g/dL    RDW 12 11 - 15 %    MPV 9.1 8.9 - 12.5 fL    Neutrophils  59.7 None %    Lymphocytes Automated 28.3 None %    Monocytes 8.3 None %    Eosinophils Automated 2.3 None %    Basophils Automated 0.4 None %    Immature Granulocyte 1.0 None %    Nucleated RBC 0.0 0.0 - 0.0 /100 WBC    Neutrophils Absolute 4.96 1.10 - 6.33 x10 3/uL    Abs Lymph Automated 2.35 0.42 - 3.22 x10 3/uL    Abs Mono Automated 0.69 0.21 - 0.85 x10 3/uL    Abs Eos Automated 0.19 0.00 - 0.44 x10 3/uL    Absolute Baso Automated 0.03 0.00 - 0.08 x10 3/uL    Absolute Immature Granulocyte 0.08 (H) 0.00 - 0.07 x10 3/uL    Absolute NRBC 0.00 0.00 - 0.00 x10 3/uL   GFR    Collection Time: 05/25/18  8:57 PM   Result Value Ref Range    EGFR >60.0    PT/APTT    Collection Time: 05/25/18  8:57 PM   Result Value Ref Range    PT 12.1 (L) 12.6 - 15.0 sec    PT INR 0.9 0.9 - 1.1    PT Anticoag. Given Within 48 hrs. None     PTT 29 23 - 37 sec   Troponin I    Collection Time: 05/25/18  8:57 PM   Result Value Ref Range    Troponin I 0.01 0.00 - 0.09 ng/mL   Ethanol (Alcohol) Level    Collection Time: 05/25/18  8:57 PM   Result Value Ref Range    Alcohol None Detected None Detected mg/dL   Salicylate level    Collection Time: 05/25/18  8:57 PM   Result Value Ref Range    Salicylate Level <5.0 (L) 15.0 - 30.0 mg/dL   Acetaminophen level    Collection Time: 05/25/18  8:57 PM   Result Value Ref Range    Acetaminophen Level <7 (L) 10 - 30 ug/mL   Hepatic function panel (LFT)    Collection Time: 05/25/18  8:57 PM   Result Value Ref Range    Bilirubin, Total 1.2 0.2 - 1.2 mg/dL    Bilirubin, Direct 0.4 0.0 - 0.5 mg/dL    Bilirubin, Indirect 0.8 0.2 - 1.0 mg/dL    AST (SGOT) 14 5 - 34 U/L    ALT 21 0 - 55 U/L    Alkaline Phosphatase 113 (H) 38 - 106 U/L    Protein, Total 6.2 6.0 - 8.3 g/dL    Albumin 3.8 3.5 - 5.0 g/dL    Globulin 2.4 2.0 - 3.6 g/dL    Albumin/Globulin Ratio 1.6 0.9 - 2.2   Lipase    Collection Time: 05/25/18  8:57 PM   Result Value Ref Range    Lipase 9 8 - 78 U/L   Magnesium    Collection Time: 05/25/18  8:57 PM    Result Value Ref Range    Magnesium 1.6 1.6 - 2.6 mg/dL   Phosphorus    Collection Time: 05/25/18  8:57 PM   Result Value Ref Range    Phosphorus 3.8 2.3 - 4.7 mg/dL   Creatine Kinase (CK)    Collection Time: 05/25/18  8:57 PM   Result Value Ref Range    Creatine Kinase (CK) 133 47 - 267 U/L   Urinalysis with microscopic    Collection Time: 05/25/18  9:31 PM   Result Value Ref Range    Urine Type Clean Catch     Color, UA Straw Clear - Yellow    Clarity, UA Clear Clear - Hazy    Specific Gravity UA 1.006 1.001 - 1.035    Urine pH 7.0 5.0 - 8.0    Leukocyte Esterase, UA Negative Negative    Nitrite, UA Negative Negative    Protein, UR Negative Negative    Glucose, UA Negative Negative    Ketones UA Negative Negative    Urobilinogen, UA Normal 0.2 - 2.0 mg/dL    Bilirubin, UA Negative Negative    Blood, UA Negative Negative    RBC, UA 3 - 5 0 - 5 /hpf    WBC, UA 0 - 5 0 - 5 /hpf   Rapid drug screen, urine    Collection Time: 05/25/18  9:31 PM   Result Value Ref Range    Amphetamine Screen, UR Negative Negative    Barbiturate Screen, UR Negative Negative    Benzodiazepine Screen, UR Negative Negative    Cannabinoid Screen, UR Negative Negative    Cocaine, UR Negative Negative    Opiate Screen, UR Positive (A) Negative    PCP Screen, UR Negative Negative       IMAGING (images personally reviewed and concur with radiologist unless otherwise stated below):  XR Chest  AP Portable   Final Result      Right basilar opacity,  also seen previously and likely representing   atelectasis and/or scarring. No new acute process is identified.      Carla Drape, MD    05/25/2018 10:23 PM      XR Knee 1 Or 2 Views Right   Final Result      No evidence of acute displaced fracture or dislocation.      Degenerative changes, as above.      Small knee joint effusion.      Carla Drape, MD    05/25/2018 10:20 PM          Markers:    Recent Labs  Lab 05/25/18  2057   Creatine Kinase (CK) 133   Troponin I 0.01       EMERGENCY DEPARTMENT  COURSE:  Orders Placed This Encounter   Procedures   . XR Chest  AP Portable   . XR Knee 1 Or 2 Views Right   . Basic Metabolic Panel   . CBC and differential   . GFR   . Urinalysis with microscopic   . Rapid drug screen, urine   . PT/APTT   . Troponin I   . Ethanol (Alcohol) Level   . Salicylate level   . Acetaminophen level   . Hepatic function panel (LFT)   . Lipase   . Magnesium   . Phosphorus   . Creatine Kinase (CK)   . Basic Metabolic Panel   . CBC with differential   . GFR   . Phosphorus   . Magnesium   . Diet low sodium 2 GM NA   . Glucose POC   . Orthostatic Vital signs   . Cardiac monitoring (ED ONLY)   . Notify Admitting Attending ( Change in Condition)   . Notify Physician (Vital Signs)   . Notify Physician (Lab Results)   . Vital Signs Q4HR   . Bed rest   . Pulse Oximetry   . Progressive Mobility Protocol   . Notify physician   . I/O   . Height   . Weight   . Skin assessment   . Place sequential compression device   . Maintain sequential compression device   . Education: Activity   . Education: Disease Process & Condition   . Education: Pain Management   . Education: Falls Risk   . Vital signs   . Telemetry 24 Hour Protocol   . Pain Assessment   . Pulse Oximetry   . Notify physician (specify)   . CIWA LORazepam Nursing Communication   . Education: Alcohol Intake   . Full Code   . Wound,Continence eval and treat Reason for consult? Pressure Injury   . Inpatient consult to Nutrition   . Cats Consult   . Contact isolation status   . OT eval and treat   . PT EVALUATE AND TREAT   . Glucose Whole Blood - POCT   . ECG 12 lead   . Saline lock IV   . Admit to Inpatient   . Einar Gip ED Bed Request (Inpatient)   . Fall precautions   . Fall precautions   . Aspiration precautions   . Skin and pressure ulcer precautions   . Seizure precautions       ASSESSMENT & PLAN     CHAZE HRUSKA is a 77 y.o. male with a PMHx of DJD of spine, chronic back pain, lumbar stenosis s/p spinal fusion L4-S1 2016, and subsequent  hardware removal 05/21/18, B/L PE, HTN, Alcohol abuse  who presented with falls at home and confusion. Patient with alcohol withdrawal syndrome    1. Alcohol withdrawal syndrome. Patient tremulous, restless, and apparently was hallucinating at home, per daughter. Also fell at least twice at home. Patient denies heavy drinking, but daughter reports heavy drinking and found almost empty bottle of Christiane Ha in patient's room. Patient reports he only drinks a glass or half a glass of wine daily. He reports last drink was day before yesterday. Will place on CIWA protocol. Daily MVI, Thiamine and Folate. Low dose librium taper. Seizure, aspiration and fall precautions. Monitor electrolytes and correct them  2. Elevated BPs. Likely due to withdrawal sx's. Continue management per # 1. Resume home regimen of BP meds. Also Clonidine prn. Alcohol cessation counseling. Patient not interested in Rehab  3. Falls at home. PT/OT eval, fall precautions. No obvious trauma on exam. Avoid excessive use of sedatives, opioids  4. Chronic back pain s/p prior L4-S1 fusion in 2016, and subsequent hardware removal 05/21/18 due to persistent back pain. Patient follows with spine surgery outpatient. Continue home regimen of pain meds- MS Contin and Dilaudid prn. May need to decrease doses or completely wean off given issues with recurrent falls at home. Surgical wound appears intact, no signs of infection. Will get PT/OT eval and wound care RN  5. Generalized weakness. Per # 3. May benefit from acute rehab or SNF  6. HTN. Continue home regimen of BP meds  7. Depression, anxiety. Continue home regimen of cymbalta      Safety Checklist  DVT prophylaxis:  CHEST guideline (See page [id number]) Chemical   Foley: Not present   IVs:  Peripheral IV   PT/OT: Ordered   Daily CBC & or Chem ordered:  SHM/ABIM guidelines (see #5) Yes, due to clinical and lab instability     Anticipated medical stability for discharge: TBD      Signed,  Andi Hence,  MD  05/26/2018 1:37 AM

## 2018-05-26 NOTE — OT Progress Note (Signed)
Occupational Therapy Note    Orders received, chart reviewed.  Pt w/ R knee pain and LL back pain after falls at home.  Spoke w/ MD.  Pt cleared to be seen by therapy at this time.  Pt's x-ray R knee indicated no fx.  Pt s/p hardware removal 05/21/18.  No plans for imaging of spine.  Spoke w/ RN.  Pt not appropriate for therapy at this time due to increased confusion and alcohol withdrawal.  RN requesting to hold therapy and re-attempt this afternoon.      Marilynne Drivers, [place] 05/26/2018 9:14 AM

## 2018-05-26 NOTE — Consults (Signed)
Wound Ostomy Continence Consultation    Date Time: 05/26/18 12:26 PM  Patient Name: Matthew [name], Matthew [name]  Consulting Service: WOCN  Reason for Consult: Pressure Injury    Assessment & Plan   Assessment   Pt s/p lumbar surgery for hardware removal on 05/21/18, removed old gauze/tape dressing over the incision, incision appears WNL, no erythema noted over the incision, edges are approximated/closed with surgical glue, no visible sutures.  No drainage/exudate from the incision.  There is some dependent ecchymosis at the inferior aspect of the incision.  Sacralcoccygeal skin intact without redness.  Bilateral heel skin intact without redness.  Heels elevated from bed.      Plan:  Applied sacral foam dressing, would not apply anything to spinal incision.  Continue with routine repositioning and offloading heels from bed.      Objective Findings   Specialty Bed:  [facility] appropriate  Wound Assessment:  na           History of Presenting Illness   HPI:   This is a 77 y.o. male with underlying ETOH abuse admitted with frequent falls    Past Medical and Surgical History     Past Medical History:   Diagnosis Date   . Anxiety 2000   . BPH (benign prostatic hypertrophy) 2007    w/ hx of obstruction - s/p suprapubic prostatectomy   . Disorder of prostate 1999   . Fibromyalgia     in remission   . Gastroesophageal reflux disease    . Hiatal hernia 2019   . History of pancreatitis 2007    had microscopic stones - ERCP w/ stent done   . Hyperlipidemia 1995   . Hypertension     controlled   . 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 Pancreatitis 2014   . Spinal stenosis    . Spondylolisthesis    . Syncopal episodes approx 2012    1x episode - had negative cardiac work-up - no episodes sinice       Past Surgical History:   Procedure Laterality Date   . APPENDECTOMY  1952   . CERVICAL FUSION  1997    states has full ROM of neck   . CHOLECYSTECTOMY  1980   . ERCP, STENT  2007   .  LAMINECTOMY, POSTERIOR LUMBAR, DECOMP, FUSION, LEVEL 2 N/A 11/10/2015    Procedure: LAMINECTOMY, POSTERIOR LUMBAR, DECOMP, FUSION, LEVEL 2;  Surgeon: Clelia Schaumann, MD;  Location: [facility];  Service: Orthopedics;  Laterality: N/A;  L4-S1 LAMINECTOMY/FUSION   . REMOVAL, POSTERIOR LUMBAR SPINE HARDWARE N/A 05/20/2018    Procedure: REMOVAL POSTERIOR LUMBAR SPINE HARDWARE L4-S1;  Surgeon: Clelia Schaumann, MD;  Location: [facility];  Service: Orthopedics;  Laterality: N/A;  REMOVAL OF HARDWARE LUMBAR SPINE   . SUPRAPUBIC PROSTATECTOMY  2007   . TONSILLECTOMY  as child   . VASECTOMY  1980's       Family History   History reviewed. No pertinent family history.    Lab   Significant Lab Values:    Recent Labs      05/26/18   0716  05/25/18   2057   WBC  6.83  8.30   RBC  4.07*  4.26   Hgb  12.9  13.4   Hematocrit  38.1  39.7   Sodium  141  140   Potassium  4.2  4.3   Chloride  104  100   CO2  29  28   BUN  13  16   Creatinine  0.8  0.9   Calcium  9.1  9.6   Albumin   --   3.8   EGFR  >60.0  >60.0   Glucose  102*  104*       Kim Sherry Rogus RN, BSN, [employer], [employer]  [facility] [employer]  [phone]

## 2018-05-26 NOTE — Progress Notes (Signed)
Unable to collect orthostatic VS due to patient instability when getting up from bed

## 2018-05-26 NOTE — OT Eval Note (Signed)
[facility]   Occupational Therapy Evaluation     Patient: Matthew [name]      MRN#: [id number]   Unit: [facility]  Bed: V409/W119-14      Attention Physicians:  For patients in an observation or outpatient status, Medicare requires the ordering provider to certify that this service is medically necessary.  Please co-sign this evaluation to indicate your agreement.  Thank you.    Time Calculation  OT Received On: 05/26/18  Start Time: 1545  Stop Time: 1557  Time Calculation (min): 12 min    Consult received for Matthew [name] for OT Evaluation and Treatment.  Patient's medical condition is appropriate for Occupational therapy intervention at this time.      OT Recommendations  Discharge Recommendation: Acute Rehab  DME Recommended for Discharge: Other (Comment) (TBD by next facility)      Evaluation: Pt lives alone in 1 level home.  Prior to hospital admission, pt was independent w/ ADLs and used a [facility] for functional mobility.  During eval, pt requiring CGA for lower body dressing (able to doff/don socks using modified tailor sit) and toileting, min A for functional transfers, and CGA for functional mobility using FWW to/from bathroom.  Recommend acute rehab placement to increase pt's independence w/ ADLs, stability, and safety.     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 male admitted on 05/25/2018 with falls and AMS.     Patient Active Problem List   Diagnosis   . Degenerative lumbar spinal stenosis   . Lumbar stenosis   . Spondylolisthesis of lumbar region   . Hypertension   . BPH (benign prostatic hypertrophy)   . History of lumbar laminectomy for spinal cord decompression   .  Pulmonary embolism, bilateral   . Painful orthopaedic hardware   . Alcohol withdrawal      Past Medical History:   Diagnosis Date   . Anxiety 2000   . BPH (benign prostatic hypertrophy) 2007    w/ hx of obstruction - s/p suprapubic prostatectomy   . Disorder of prostate 1999   . Fibromyalgia     in remission   . Gastroesophageal reflux disease    . Hiatal hernia 2019   . History of pancreatitis 2007    had microscopic stones - ERCP w/ stent done   . Hyperlipidemia 1995   . Hypertension     controlled   . 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 Pancreatitis 2014   . Spinal stenosis    . Spondylolisthesis    . Syncopal episodes approx 2012    1x episode - had negative cardiac work-up - no episodes sinice     Past Surgical History:   Procedure Laterality Date   . APPENDECTOMY  1952   . CERVICAL FUSION  1997    states has full ROM of neck   . CHOLECYSTECTOMY  1980   . ERCP, STENT  2007   . LAMINECTOMY, POSTERIOR LUMBAR, DECOMP, FUSION, LEVEL 2 N/A 11/10/2015    Procedure: LAMINECTOMY, POSTERIOR LUMBAR, DECOMP, FUSION, LEVEL 2;  Surgeon: Clelia Schaumann, MD;  Location: [facility];  Service: Orthopedics;  Laterality: N/A;  L4-S1 LAMINECTOMY/FUSION   . REMOVAL, POSTERIOR LUMBAR SPINE HARDWARE N/A 05/20/2018    Procedure: REMOVAL POSTERIOR LUMBAR SPINE HARDWARE L4-S1;  Surgeon: Clelia Schaumann, MD;  Location: [facility];  Service: Orthopedics;  Laterality: N/A;  REMOVAL OF HARDWARE LUMBAR SPINE   . SUPRAPUBIC PROSTATECTOMY  2007   . TONSILLECTOMY  as child   . VASECTOMY  1980's         Social History:  Prior Level of Function  Prior level of function: Independent with ADLs;Ambulates with assistive device  Assistive Device: Single point cane  Baseline Activity Level: Community ambulation  Driving: independent  Dressing - Upper Body: independent  Dressing - Lower Body: independent  Cooking: Yes  Feeding: independent  Bathing: independent  Grooming:  independent  Toileting: independent  Employment: PT  DME Currently at Home: Single point cane  Home Living Arrangements  Living Arrangements: Alone  Type of Home: House  Home Layout: One level;Performs ADL's on one level;Able to live on main level with bedroom/bathroom;Other (No STE)  Bathroom Shower/Tub: Pension scheme manager: Standard  Bathroom Equipment: Grab bars in shower;Grab bars around toilet;Shower chair;Hand-held shower  Bathroom Accessibility: Accessible  DME Currently at Home: Single point cane    Subjective:   Patient is agreeable to participation in the therapy session. Nursing clears patient for therapy.      Pain Assessment  Pain Assessment: Numeric Scale (0-10)  Pain Score: 6-moderate pain  POSS Score: Awake and Alert  Pain Location: Back  Pain Orientation: Lower    Objective:       Patient is being transported to CT scan.     Cognitive Status and Neuro Exam:  Cognition/Neuro Status  Arousal/Alertness: Appropriate responses to stimuli  Attention Span: Appears intact  Orientation Level: Oriented X4  Memory: Appears intact  Following Commands: independent  Safety Awareness: minimal verbal instruction  Insights: Fully aware of deficits  Problem Solving: Able to problem solve independently  Behavior: calm;cooperative;attentive  Motor Planning: intact  Coordination: intact  Hand Dominance: right handed        Sensory  Auditory: intact  Tactile - Light Touch: intact  Visual Acuity: intact      Musculoskeletal Examination  Gross ROM  Right Upper Extremity ROM: within functional limits  Left Upper Extremity ROM: within functional limits  Gross Strength  Right Upper Extremity Strength: 4/5  Left Upper Extremity Strength: 4/5    Sensory/Oculomotor Examination  Sensory  Auditory: intact  Tactile - Light Touch: intact  Visual Acuity: intact    Activities of Daily Living  Self-care and Home Management  Grooming: Stand by Assist  LB Dressing: Contact Guard Assist  Toileting: Contact Guard  Assist  Functional Transfers: Minimal Assist;toilet transfer;other (Comment) (w/ grab bar)    Functional Mobility:  Mobility and Transfers  Supine to Sit: Independent  Sit to Stand: Minimal Assist  Functional Mobility/Ambulation: Risk analyst (w/ FWW)  PMP - Progressive Mobility Protocol   PMP Activity: Step 6 - Walks in Room  Distance Walked (ft) (Step 6,7): 10 Feet     Balance  Balance  Static Sitting Balance: Independent  Dyanamic Sitting Balance: Supervision  Dynamic Standing Balance: Contact Guard Assist    Participation and Activity Tolerance  Participation Effort: good  Endurance: Tolerates 10 - 20 min exercise with multiple rests      Assessment:   Patient History - Expanded chart review required including reading through labs, imaging, and  vitals.     Examination - Examination reveals impairments in the following body systems: Assessment: balance deficits;decreased independence with ADLs;decreased independence with IADLs;decreased endurance/activity tolerance.      Clinical Decision Making - There are few comorbidities or other factors that affect plan of care requiring modification of task including recent falls, alcohol withdrawal syndrome, lives alone, and depression/anxiety.        Prognosis: Good;With continued OT s/p acute discharge    Interdisciplinary Communication   Patient is being transferred to CT scan. Communicated with RN via FTF regarding patient's status.      Plan:   Treatment Interventions: ADL retraining;Functional transfer training;Endurance training;Patient/Family training;Equipment eval/education;Neuro muscular reeducation;Compensatory technique education    OT Frequency Recommended: 4-5x/wk      Education:   Educated patient to role of occupational therapy, plan of care, home safety, next appropriate level of care, safety with mobility and ADLs, hand placement for safe transfers, and position of FWW while completing ADLs. Demonstrated good understanding. Also discussed goals  of therapy and are in agreement with plan.          Goals:      Goals  Goal Formulation: Patient  Time For Goal Achievement: by time of discharge  Goals: Select goal  Patient will dress lower body: Modified Independent;with AE;without AE;5 visits  Patient will toilet: Independent;5 visits  Pt will perform functional transfers: Modified Independent;with rolling walker;5 visits  Pt will transfer bed to toilet: Supervision;with rolling walker;5 visits        Signature: Marilynne Drivers, [place] 05/26/2018 4:45 PM

## 2018-05-26 NOTE — Plan of Care (Signed)
Problem: Moderate/High Fall Risk Score >5  Goal: Patient will remain free of falls  Outcome: Progressing   05/26/18 0053   OTHER   High (Greater than 13) LOW-Fall Interventions Appropriate for Low Fall Risk;LOW-Anticoagulation education for injury risk;MOD-Initiate Yellow "Fall Risk" magnet communication tool;MOD-Use of chair-pad alarm when appropriate;MOD-Consider a move closer to Nurses Station;MOD-Remain with patient during toileting;MOD-Re-orient confused patients;MOD-Utilize diversion activities;MOD-Include family in multidisciplinary POC discussions;MOD-Place Fall Risk level on whiteboard in room;HIGH-Activate bed/chair exit alarm where available;HIGH-Apply yellow "Fall Risk" arm band  (PSA unavaliable)   Patient impulsive, hallucinating, had 2 falls today per report. Sitter not available at this time per charge. Bed alarm on, door and window open to nurses station for supervision    Problem: Safety  Goal: Patient will be free from injury during hospitalization  Outcome: Progressing  Patient in the bed, wheels locked, 3/4 side rails up. Call bell in reach, bed alarm on. On telemetry monitor.

## 2018-05-26 NOTE — Consults (Signed)
Nutrition Assessment    Matthew [name] 77 y.o. male   MRN: [id number]      Reason for Assessment: MD Consult - "malnutrition, alcohol abuse"    Nutrition Recommendation:  Goal: PO intake >70% of meals.   Plan:   1) Continue current diet. Optimize intake during periods of coherence.    2) Agree with vitamin regimen per CIWA protocol.    3) Will attempt to obtain more detailed nutrition hx during subsequent RD visits if pt more coherent or daughter present.     Matthew [name], RD  Clinical Dietitian  Ext. [phone]  ______________________________________________________________________      Assessment Data:  Summary:   77 y/o M p/w falls at home and confusion. PMH significant for DJD of spine, chronic back pain, lumbar stenosis s/p spinal transfusion (2016) and subsequent hardware removal (05/21/2018), bilateral PE, HTN, etoh abuse, depression, anxiety. Pt is a poor historian. Has been staying with daughter since recent hardware removal. Pt is a heavy drinker and has had multiple falls at home since d/c. +Tremors, anxiousness. Daughter reports pt having hallucinations. Dx with alcohol withdrawal syndrome. Pt not interested in rehab, denies heavy drinking. Surgical wound reportedly intact.     Pt seen at bedside. Sitter present, but no other visitors in room. Per sitter, daughter has not been by today but might be coming later this afternoon. Pt incoherent and not able to appropriately answer any questions at this time. No wasting appreciated. No food at bedside- per sitter, pt has not been coherent enough to give order preferences. Encouraged sitter to order standard tray for now. Based on known information/data, pt does not qualify for malnutrition dx at this time.       Weight Monitoring Weight Weight Method   11/18/2015 71.895 kg Actual   12/02/2015 68.04 kg    05/06/2018 61.236 kg Stated   05/20/2018 59.9 kg    05/26/2018 61.735 kg Bed Scale   Unable to assess for recent wt loss.       Adm dx:  Alcohol withdrawal  Patient  Active Problem List   Diagnosis   . Degenerative lumbar spinal stenosis   . Lumbar stenosis   . Spondylolisthesis of lumbar region   . Hypertension   . BPH (benign prostatic hypertrophy)   . History of lumbar laminectomy for spinal cord decompression   . Pulmonary embolism, bilateral   . Painful orthopaedic hardware   . Alcohol withdrawal       PMH:  has a past medical history of Anxiety (2000); BPH (benign prostatic hypertrophy) (2007); Disorder of prostate (1999); Fibromyalgia; Gastroesophageal reflux disease; Hiatal hernia (2019); History of pancreatitis (2007); Hyperlipidemia (1995); Hypertension; Malignant neoplasm of skin; Pancreatitis (2014); Spinal stenosis; Spondylolisthesis; and Syncopal episodes (approx 2012).      Recent Labs  Lab 05/26/18  0716 05/25/18  2057   Sodium 141 140   Potassium 4.2 4.3   Chloride 104 100   CO2 29 28   BUN 13 16   Creatinine 0.8 0.9   Glucose 102* 104*   Calcium 9.1 9.6   Magnesium 1.5* 1.6   Phosphorus 3.2 3.8   EGFR >60.0 >60.0   WBC 6.83 8.30   Hematocrit 38.1 39.7   Hgb 12.9 13.4   Lipase  --  9   Results for Matthew [name] (MRN [id number]) as of 05/26/2018 11:09   Ref. Range 05/25/2018 20:57   AST Whole Blood Latest Ref Range: 5 - 34 U/L 14   ALT, Whole Blood  Latest Ref Range: 0 - 55 U/L 21   Alkaline Phosphatase Latest Ref Range: 38 - 106 U/L 113 (H)   Albumin Latest Ref Range: 3.5 - 5.0 g/dL 3.8   Protein, Total Latest Ref Range: 6.0 - 8.3 g/dL 6.2   Globulin Latest Ref Range: 2.0 - 3.6 g/dL 2.4   Albumin/Globulin Ratio Latest Ref Range: 0.9 - 2.2  1.6   Bilirubin, Total Latest Ref Range: 0.2 - 1.2 mg/dL 1.2   Bilirubin, Direct Latest Ref Range: 0.0 - 0.5 mg/dL 0.4   Bilirubin, Indirect Latest Ref Range: 0.2 - 1.0 mg/dL 0.8           Current Facility-Administered Medications   Medication Dose Route Frequency   . chlordiazePOXIDE  25 mg Oral Q12H SCH    Followed by   . [START ON 05/28/2018] chlordiazePOXIDE  25 mg Oral Q24H SCH   . DULoxetine  20 mg Oral Daily   . enoxaparin   40 mg Subcutaneous Daily   . folic acid  1 mg Oral Daily   . lisinopril-amLODIPine 10-5 combo dose   Oral Daily   . morphine  15 mg Oral Q12H SCH   . multivitamin  1 tablet Oral Daily   . thiamine  100 mg Oral Daily   . traZODone  50 mg Oral QHS     . dextrose 5 % and 0.9% NaCl 75 mL/hr at 05/26/18 0152   PRN given in past 48 hours: Flexeril, Dilaudid, Ativan    Social History:  Heavy drinker. Has been staying with adult daughter since recent surgery.          Diet History: UTO                                                                              Current Diet Order      Diet low sodium 2 GM NA    Food intake: no PO intake since admission 6/16    No Known Allergies    Nutrition Focused Physical Exam:  Head - no wasting appreciated  General - pt thin, but no significant wasting appreciated  Edema - none  Skin - spinal incision not observed but documented as intact  GI function - +BM today    Learning Needs: pt not appropriate for education at present    Anthropometrics  Height: 172.7 cm (5\' 8" )  Weight: 61.7 kg (136 lb 1.6 oz)  Weight Change: 0  IBW/kg (Calculated) Male: 70.02 kg  IBW/kg (Calculated) Male: 63.62 kg  BMI (calculated): 20.7      Nutrition Diagnosis:   Will defer nutrition dx until more information/data obtained.     Intervention:  Goal: PO intake >70% of meals.   Plan:   1) Continue current diet. Optimize intake during periods of coherence.    2) Agree with vitamin regimen per CIWA protocol.    3) Will attempt to obtain more detailed nutrition hx during subsequent RD visits if pt more coherent or daughter present.     Monitoring/Evaluation:   1. PO intake  2. Weights  3. GI symptoms      Matthew [name], RD

## 2018-05-27 MED ORDER — FAMOTIDINE 20 MG PO TABS
20.00 mg | ORAL_TABLET | Freq: Every day | ORAL | Status: DC
Start: 2018-05-27 — End: 2018-05-29
  Administered 2018-05-27 – 2018-05-29 (×3): 20 mg via ORAL
  Filled 2018-05-27 (×4): qty 1

## 2018-05-27 MED ORDER — FAMOTIDINE 10 MG/ML IV SOLN (WRAP)
20.00 mg | Freq: Every day | INTRAVENOUS | Status: DC
Start: 2018-05-27 — End: 2018-05-29
  Filled 2018-05-27: qty 2

## 2018-05-27 NOTE — Progress Notes (Signed)
[facility]  HOSPITALIST  PROGRESS NOTE      Patient: Matthew [name]  Date: 05/27/2018   LOS: 2 Days  Admission Date: 05/25/2018   MRN: [id number]  Attending: Randall An MD     ASSESSMENT/PLAN     Matthew [name] is a 77 y.o. male PMHx of DJD of spine, chronic back pain, lumbar stenosis s/p spinal fusion L4-S1 2016, and subsequent hardware removal 05/21/18, B/L PE, HTN, Alcohol abuse who presented with falls at home and confusion. Patient with alcohol withdrawal syndrome     1. Alcohol withdrawal syndrome. CIWA, Ativan IV PRN. Daily MVI, Thiamine and Folate.  Seizure, aspiration and fall precautions. Monitor electrolytes and correct them  2. HTN Crisis: Likely due to withdrawal sx's. Continue management per # 1. Cont home regimen of BP meds.  3. Falls at home. PT/OT eval recommend acute rehab, fall precautions. CT head possible NPH. Neuro consulted. Pt unable to have MRI due to spinal stimulator.   4. Chronic back pain s/p prior L4-S1 fusion in 2016, and subsequent hardware removal 05/21/18 due to persistent back pain. Patient follows with spine surgery outpatient. Holding MS Contin due to lethargy but will continue Dilaudid prn.   5. HTN. Continue home regimen of BP meds  6. Depression, anxiety. Continue home regimen of cymbalta  7. Encephalopathy: Suspect metabolic due to etoh. Now improved. Head CT possible NPH. Neuro consulting.   8. Hypomagnesemia: Monitor and replete PRN       Safety Checklist  DVT prophylaxis: Mechanical   Foley: Not present   IVs:  Peripheral IV   PT/OT: Ordered   Daily CBC & or Chem ordered: Yes, due to clinical and lab instability       Patient Lines/Drains/Airways Status    Active PICC Line / CVC Line / PIV Line / Drain / Airway / Intraosseous Line / Epidural Line / ART Line / Line / Wound / Pressure Ulcer / NG/OG Tube     Name:   Placement date:   Placement time:   Site:   Days:    Peripheral IV 05/26/18 Left Antecubital  05/26/18        Antecubital    less than 1    Peripheral IV 05/26/18  Right Forearm  05/26/18    0213    Forearm    less than 1                MD/RN rounds: yes        Code Status: full code    DISPO: pending medical course     Family Contact: none    Care Plan discussed with nursing, consultants, case manager.       SUBJECTIVE     Matthew [name] states he has no complaints. No overnight events.     MEDICATIONS     Current Facility-Administered Medications   Medication Dose Route Frequency   . DULoxetine  20 mg Oral Daily   . enoxaparin  40 mg Subcutaneous Daily   . famotidine  20 mg Oral Daily    Or   . famotidine  20 mg Intravenous Daily   . folic acid  1 mg Oral Daily   . lisinopril-amLODIPine 10-5 combo dose   Oral Daily   . multivitamin  1 tablet Oral Daily   . thiamine  100 mg Oral Daily   . traZODone  50 mg Oral QHS       ROS     Remainder of 10 point ROS  as above or otherwise negative    PHYSICAL EXAM     Vitals:    05/27/18 1144   BP: 107/68   Pulse: 95   Resp: 16   Temp: 98.8 F (37.1 C)   SpO2: 96%       Temperature: Temp  Min: 97.9 F (36.6 C)  Max: 98.8 F (37.1 C)  Pulse: Pulse  Min: 86  Max: 110  Respiratory: Resp  Min: 16  Max: 20  Non-Invasive BP: BP  Min: 95/58  Max: 165/89  Pulse Oximetry SpO2  Min: 94 %  Max: 100 %    Intake and Output Summary (Last 24 hours) at Date Time  No intake or output data in the 24 hours ending 05/27/18 1528    GEN APPEARANCE: A&OX 3   HEENT: PERLA; EOMI; Conjunctiva Clear  NECK: Supple; No bruits  CVS: RRR, S1, S2; No M/G/R  LUNGS: CTAB; No Wheezes; No Rhonchi: No rales  MSK: spine, well healing surgical incision site, no drainage or erythema  ABD: Soft; No TTP; + Normoactive BS  EXT: No edema; Pulses 2+ and intact  SKIN: No rash or Lesions  NEURO: CN 2-12 intact; No Focal neurological deficits      LABS       Recent Labs  Lab 05/26/18  0716 05/25/18  2057   WBC 6.83 8.30   RBC 4.07* 4.26   Hgb 12.9 13.4   Hematocrit 38.1 39.7   MCV 93.6 93.2   Platelets 235 280         Recent Labs  Lab 05/26/18  0716 05/25/18  2057   Sodium 141 140    Potassium 4.2 4.3   Chloride 104 100   CO2 29 28   BUN 13 16   Creatinine 0.8 0.9   Glucose 102* 104*   Calcium 9.1 9.6   Magnesium 1.5* 1.6         Recent Labs  Lab 05/25/18  2057   ALT 21   AST (SGOT) 14   Bilirubin, Total 1.2   Bilirubin, Direct 0.4   Albumin 3.8   Alkaline Phosphatase 113*         Recent Labs  Lab 05/25/18  2057   Creatine Kinase (CK) 133   Troponin I 0.01         Recent Labs  Lab 05/25/18  2057   PT INR 0.9   PT 12.1*   PTT 29       Microbiology Results     None           RADIOLOGY     Radiological Procedure personally reviewed and concur with radiologist reports unless stated otherwise.    CT Head WO Contrast   Final Result    No acute intracranial hemorrhage or mass effect identified.   Cerebral volume loss. Ventricular prominence as above.            The following dose reduction techniques were utilized: automated   exposure control and/or adjustment of the mA and/or kV according to   patient size, and the use of iterative reconstruction technique.      Melody Haver, MD    05/26/2018 4:32 PM      XR Chest  AP Portable   Final Result      Right basilar opacity, also seen previously and likely representing   atelectasis and/or scarring. No new acute process is identified.      Carla Drape, MD  05/25/2018 10:23 PM      XR Knee 1 Or 2 Views Right   Final Result      No evidence of acute displaced fracture or dislocation.      Degenerative changes, as above.      Small knee joint effusion.      Carla Drape, MD    05/25/2018 10:20 PM      MRI Brain WO Contrast    (Results Pending)       Signed,  Randall An MD  3:28 PM 05/27/2018

## 2018-05-27 NOTE — PT Progress Note (Signed)
Physical Therapy Note  [facility]  Physical Therapy Treatment    Patient:  Matthew [name] MRN#:  [id number]  Unit:  [facility] Room/Bed:  V409/W119-14      Recommendation  Discharge Recommendation: Acute Rehab  DME Recommended for Discharge:  (states he owns 2 rolling walkers with brakes)  PT Frequency: 4-5x/wk    Recommended mode of transportation at discharge:   Car with family    PMP - Progressive Mobility Protocol   PMP Activity: Step 7 - Walks out of Room  Distance Walked (ft) (Step 6,7): 45 Feet    Assessment:   Improved tolerance and motivation to perform all functional activities this session.  CGA required to maintain safety during ambulation with FWW due to reported back spasms and assist with FWW management.  Sitters present throughout treatment. Patient calm appropriate and fully participated in skilled treatment.  Resting HR 100s; 84 in sinus rhythm  while ambulating.  Assessment: Decreased safety/judgement during functional mobility;Decreased endurance/activity tolerance;Decreased functional mobility;Gait impairment.  Prognosis: Good;With continued PT status post acute discharge      Interdisciplinary Communication:   Patient up in bed with sitter present and with alarm activated; call bell within reach. Updated white communication board in room with patient's current mobility status: assist x 1 with FWW.  Communicated with nurse regarding functional progress    Education:   Further education provided to patient on safety with mobility and ADLs, home safety, falls prevention.  Demonstrated good understanding with one step commands as provided.  Will benefit from further training to focus on functional bed mobility transfers balance and gait training to maximize safe mobility upon discharge    Plan:   Goals  Pt Will Perform Sit To Supine: independent;Partly met  Pt Will Perform Sit to Stand: independent;Not met  Pt Will Transfer Bed/Chair: with rolling walker;independent;Not met  Pt Will  Ambulate: 51-100 feet;with rolling walker;independent;Not met  Plan  Risks/Benefits/POC Discussed with Pt/Family: With patient  Treatment/Interventions: Functional transfer training;Gait training;Bed mobility  PT Frequency: 4-5x/wk    Continue plan of care.    ------------------------------------------------------------------------------------------------------------------    Time of treatment:   Start Time: 1434 Stop Time: 1452  Time Calculation (min): 18 min  PT Received On: 05/27/18      Precautions  Weight Bearing Status: no restrictions  Other Precautions: falls, monitor CIWA      Patient's medical condition is appropriate for Physical Therapy intervention at this time.   LPTA reviewed physical therapist's evaluation prior to treatment session.    Subjective: Patient is agreeable to participation in the therapy session.      Pain Assessment  Pain Assessment:  (c/o back spasms during amb and at end of treatment; RN aware)          Objective:  Patient is in bed with telemetry and peripheral IV in place.            Cognition/Neuro Status  Arousal/Alertness: Appropriate responses to stimuli  Attention Span: Attends to task with redirection  Orientation Level: Oriented X4  Memory: Appears intact  Following Commands: Follows one step commands without difficulty  Safety Awareness: minimal verbal instruction  Insights: Decreased awareness of deficits;Educated in safety awareness  Problem Solving: supervision  Behavior: calm;cooperative  Motor Planning: intact  Coordination: intact    Functional Mobility  Supine to Sit: Supervision  Scooting to EOB: Supervision;Increased Time;Increased Effort  Sit to Supine: Minimal Assist  Sit to Stand: Contact Guard Assist (used leverage of B LEs against bed  frame)  Stand to Sit: Stand by Assist    Transfers  Bed to Chair: Contact Guard Assist    Locomotion  Ambulation: Contact Guard Assist;with front-wheeled walker  Pattern: R foot decreased clearance;L foot decreased  clearance;decreased cadence;decreased step length;Narrow BOS (slow antalgic steps)       Signature: Marrian Salvage, PTA

## 2018-05-27 NOTE — Plan of Care (Signed)
Problem: Safety  Goal: Patient will be free from injury during hospitalization  Outcome: Progressing   05/27/18 2347   Goal/Interventions addressed this shift   Patient will be free from injury during hospitalization  Assess patient's risk for falls and implement fall prevention plan of care per policy;Use appropriate transfer methods;Provide and maintain safe environment;Assess for patients risk for elopement and implement Elopement Risk Plan per policy;Ensure appropriate safety devices are available at the bedside;Include patient/ family/ care giver in decisions related to safety;Hourly rounding;Provide alternative method of communication if needed (communication boards, writing)       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 Pt resting in bed, safety precautions in place, seizure precautions in place. Low Ciwa score noted this shift, IV hydration continued. Vital signs within normal limits, will cont to monitor.

## 2018-05-27 NOTE — Plan of Care (Signed)
Problem: Safety  Goal: Patient will be free from injury during hospitalization  Outcome: Progressing   05/27/18 0408   Goal/Interventions addressed this shift   Patient will be free from injury during hospitalization  Assess patient's risk for falls and implement fall prevention plan of care per policy;Provide and maintain safe environment;Use appropriate transfer methods;Ensure appropriate safety devices are available at the bedside;Include patient/ family/ care giver in decisions related to safety;Assess for patients risk for elopement and implement Elopement Risk Plan per policy;Provide alternative method of communication if needed (communication boards, writing);Hourly rounding   Pt AOx4, VSS. CIWA protocol in place, safety CNA at bedside. Dilaudid po given as ordered prn for pain. Pt assisted with T&P. Mepilex to lumbar spine. Bed alarm, side rails 3/4, pt belongings are within reach, safety is maintained. Will continue to monitor.

## 2018-05-27 NOTE — Progress Notes (Addendum)
Met with pt at bedside and confirmed demographics. Pt reports his PCP is a "Dr. Rafael Bihari" near [place], [place].  He lives alone in a ground level home.  He denies having a current Proofreader. Prior to most recent back surgery, he sometimes needed help with ADLs and reports siblings, neighbor and friend provided help as needed.  He ambulated with a cane or walker.  He had food delivered and had friend, neighbor or sibling take him to appointments if he could not drive himself.  He has not had recent rehab but has had more than one episode of physical rehab in the past.  He is agreeable to AR at [place] but would prefer to go somewhere close to [place], like [facility].  He reports his plan would be to fly back to [place] ([place] and have someone pick him up and take him to rehab near [place].  DME in home: cane, walker    Conception Chancy, MSSW  Case Manager - [address] [facility]  [phone])

## 2018-05-27 NOTE — OT Progress Note (Signed)
Occupational Therapy Note    [facility]  Occupational Therapy Treatment     Patient: Matthew [name]     MRN#: [id number]   Unit: [facility]  Bed: V409/W119-14    Time Calculation  OT Received On: 05/27/18  Start Time: 1120  Stop Time: 1144  Time Calculation (min): 24 min      Assessment:   After today's treatment patient able to complete functional mobility w/ min to CGA to [facility] for toileting needs, utilized BSC due to urgency. Pt able to toilet w/ CGA and verbal cues for safety techniques and weight shifting in order to complete hygiene in sitting. Pt min impulsive w/ transfer but easily redirectable. Benefits from B UE support for ease/independence w/ sit to stand transfers. Pt reporting back pain impacting further functional mobility at this time.   Assessment: balance deficits;decreased independence with ADLs;decreased independence with IADLs;decreased endurance/activity tolerance;decreased safety awareness, resulting in continued difficulty with ADLs/functional mobility.  Prognosis: Good;With continued OT s/p acute discharge    Plan:      OT Plan  Risks/Benefits/POC Discussed with Pt/Family: With patient  Treatment Interventions: ADL retraining;Functional transfer training;Endurance training;Patient/Family training;Equipment eval/education;Neuro muscular reeducation;Compensatory technique education  Discharge Recommendation: Acute Rehab  DME Recommended for Discharge:  (tbd at rehab)  OT Frequency Recommended: 4-5x/wk       ADL Goals  Patient will dress lower body: Modified Independent;with AE;without AE;5 visits;Not met (CGA; cues for safety EOB)  Patient will toilet: Independent;5 visits;Not met (CGA )  Mobility and Transfer Goals  Pt will perform functional transfers: Modified Independent;with rolling walker;5 visits;Not met (CGA to min A for safety)  Pt will transfer bed to toilet: Supervision;with rolling walker;5 visits;Not met (CGA to min A for [facility] transfer)                       Continue  plan of care.       DME Recommended for Discharge:  (tbd at rehab)    Education:   Further education provided to patient/caregiver on safety with mobility and ADLs.  Demonstrated good understanding.     Interdisciplinary Communication:   Patient in bed/alarm set, PSA present and call bell within reach. Communicated with RN, via FTF regarding pt status.    Treatment:    Treatment # 2    Precautions and Contraindications:    Precautions  Weight Bearing Status: no restrictions  Other Precautions: falls, monitor CIWA    Subjective:  .       Patient's medical condition is appropriate for Occupational Therapy intervention at this time.  Patient is agreeable to participation in the therapy session. Nursing clears patient for therapy.  Pain Assessment  Pain Assessment: Numeric Scale (0-10)  Pain Score: 5-moderate pain  POSS Score: Awake and Alert  Pain Location: Back  Pain Orientation: Lower  Pain Frequency: Continuous;Increases with movement  Effect of Pain on Daily Activities: mild  Pain Intervention(s): Repositioned    Objective:  Patient is in bed with telemetry and peripheral IV in place.    Cognition/Neuro Status  Arousal/Alertness: Appropriate responses to stimuli  Attention Span: Attends to task with redirection  Orientation Level: Oriented X4  Memory: Appears intact  Following Commands: Follows one step commands without difficulty  Safety Awareness: minimal verbal instruction  Insights: Decreased awareness of deficits;Educated in safety awareness  Problem Solving: supervision  Behavior: calm;cooperative  Motor Planning: intact  Coordination: intact    Functional Mobility  Sit to Stand Transfers: Minimal  Assist (for safety techniques)  Stand to Sit Transfers: Minimal Assist  Bed to Bedside Commode Transfers: Contact Guard Assist;Minimal Assist  Treatment Activities: Pt required use of BSC due to urgency, pt able to transfer to [facility] w/ min to CGA w/ verbal and tactile cues for safe hand placement, sequencing movement  w/ BSC transfer. Pt able to take steps along EOB w/ CGA , and then reposition self in bed independently for comfort and pain management (pt reporting lower back pain).      Self-care and Home Management  LB Dressing: Contact Guard Assist;sitting;edge of bed;Don/doff R sock;Don/doff L sock  Toileting: Contact Guard Assist  Functional Transfers: Minimal Assist;commode transfer (due to urgency)  Treatment Activities: Pt able to complete toileting routine w/ CGA and verbal/tactile cues for safety techniques and weight shifting in order to perform hygiene. Pt able to dress LB seated EOB (don/doff socks) again w/ cues for safety w/ dynamic sitting balance necessary LB ADLs.         Signature: Janyth Pupa, OT

## 2018-05-27 NOTE — Plan of Care (Signed)
Problem: Safety  Goal: Patient will be free from injury during hospitalization  Outcome: Progressing   05/27/18 1735   Goal/Interventions addressed this shift   Patient will be free from injury during hospitalization  Assess patient's risk for falls and implement fall prevention plan of care per policy;Provide and maintain safe environment;Use appropriate transfer methods;Ensure appropriate safety devices are available at the bedside;Include patient/ family/ care giver in decisions related to safety;Hourly rounding;Assess for patients risk for elopement and implement Elopement Risk Plan per policy;Provide alternative method of communication if needed (communication boards, writing)       Problem: Pain  Goal: Pain at adequate level as identified by patient  Outcome: Progressing   05/27/18 1735   Goal/Interventions addressed this shift   Pain at adequate level as identified by patient Identify patient comfort function goal;Assess for risk of opioid induced respiratory depression, including snoring/sleep apnea. Alert healthcare team of risk factors identified.;Assess pain on admission, during daily assessment and/or before any "as needed" intervention(s);Reassess pain within 30-60 minutes of any procedure/intervention, per Pain Assessment, Intervention, Reassessment (AIR) Cycle;Evaluate patient's satisfaction with pain management progress;Evaluate if patient comfort function goal is met;Offer non-pharmacological pain management interventions;Consult/collaborate with Pain Service;Consult/collaborate with Physical Therapy, Occupational Therapy, and/or Speech Therapy;Include patient/patient care companion in decisions related to pain management as needed       Comments: Patient A&O x4, in bed with call bell and belongings in reach, bed is in the lowest position.  VSS, sitter at bedside, CIWA 2-3, will continue to monitor.

## 2018-05-27 NOTE — Progress Notes (Signed)
Met with pt's daughter outside the room. She expressed concern about his reported support at home. She is hopeful he will recover to the point of independence after AR. She has concerns about his sobriety but understands those needs will have to be addressed after physical rehab--provided that pt is open to that.     She and her sister would prefer that pt complete rehab locally at [facility]. She says DCP from [facility] would be return to her sister's (his other daughter's) home.    Referrals sent to [facility] and [facility].    Conception Chancy, MSSW  Case Manager - [facility] 4[facility]  [phone])

## 2018-05-28 LAB — BASIC METABOLIC PANEL
Anion Gap: 8 (ref 5.0–15.0)
BUN: 4 mg/dL — ABNORMAL LOW (ref 9–28)
CO2: 25 mEq/L (ref 22–29)
Calcium: 9.2 mg/dL (ref 7.9–10.2)
Chloride: 108 mEq/L (ref 100–111)
Creatinine: 0.8 mg/dL (ref 0.7–1.3)
Glucose: 150 mg/dL — ABNORMAL HIGH (ref 70–100)
Potassium: 3.8 mEq/L (ref 3.5–5.1)
Sodium: 141 mEq/L (ref 136–145)

## 2018-05-28 LAB — MAGNESIUM: Magnesium: 1.4 mg/dL — ABNORMAL LOW (ref 1.6–2.6)

## 2018-05-28 LAB — GFR: EGFR: >60

## 2018-05-28 MED ORDER — MORPHINE SULFATE ER 15 MG PO TBCR
15.00 mg | EXTENDED_RELEASE_TABLET | Freq: Two times a day (BID) | ORAL | Status: DC
Start: 2018-05-28 — End: 2018-05-29
  Administered 2018-05-28 – 2018-05-29 (×3): 15 mg via ORAL
  Filled 2018-05-28 (×3): qty 1

## 2018-05-28 MED ORDER — MAGNESIUM OXIDE 400 MG TABS (WRAP)
400.00 mg | ORAL_TABLET | Freq: Two times a day (BID) | ORAL | Status: DC
Start: 2018-05-28 — End: 2018-05-29
  Administered 2018-05-28 – 2018-05-29 (×2): 400 mg via ORAL
  Filled 2018-05-28 (×2): qty 1

## 2018-05-28 NOTE — Progress Notes (Signed)
[facility]  HOSPITALIST  PROGRESS NOTE      Patient: Matthew [name]  Date: 05/28/2018   LOS: 3 Days  Admission Date: 05/25/2018   MRN: [id number]  Attending: Randall An MD     ASSESSMENT/PLAN     Matthew [name] is a 77 y.o. male PMHx of DJD of spine, chronic back pain, lumbar stenosis s/p spinal fusion L4-S1 2016, and subsequent hardware removal 05/21/18, B/L PE, HTN, Alcohol abuse who presented with falls at home and confusion. Patient with alcohol withdrawal syndrome     1. Alcohol withdrawal syndrome. CIWA, Ativan IV PRN. Daily MVI, Thiamine and Folate.  Seizure, aspiration and fall precautions. Monitor electrolytes and correct them  2. HTN Crisis: Likely due to withdrawal sx's. Continue management per # 1. Cont home regimen of BP meds.  3. Falls at home. PT/OT eval recommend acute rehab, fall precautions. CT head possible NPH. Neuro consul appreciated. Pt clinically not felt to have NPH. Pt unable to have MRI due to spinal stimulator.   4. Chronic back pain s/p prior L4-S1 fusion in 2016, and subsequent hardware removal 05/21/18 due to persistent back pain. Patient follows with spine surgery outpatient. Restart MS Contin as mental status improved. Dilaudid prn.   5. HTN. Continue home regimen of BP meds  6. Depression, anxiety. Continue home regimen of cymbalta  7. Encephalopathy: Suspect metabolic due to etoh. Now improved.   8. Hypomagnesemia: Po mag ox BID ordered       Safety Checklist  DVT prophylaxis: Mechanical   Foley: Not present   IVs:  Peripheral IV   PT/OT: Ordered   Daily CBC & or Chem ordered: Yes, due to clinical and lab instability       Patient Lines/Drains/Airways Status    Active PICC Line / CVC Line / PIV Line / Drain / Airway / Intraosseous Line / Epidural Line / ART Line / Line / Wound / Pressure Ulcer / NG/OG Tube     Name:   Placement date:   Placement time:   Site:   Days:    Peripheral IV 05/26/18 Left Antecubital  05/26/18        Antecubital    less than 1    Peripheral IV 05/26/18 Right  Forearm  05/26/18    0213    Forearm    less than 1                MD/RN rounds: yes        Code Status: full code    DISPO:  AR tomorrow, 1:1 sitter removed     Family Contact: none    Care Plan discussed with nursing, consultants, case Production designer, theatre/television/film.       SUBJECTIVE     Matthew [name] states he has no complaints. No overnight events.     MEDICATIONS     Current Facility-Administered Medications   Medication Dose Route Frequency   . DULoxetine  20 mg Oral Daily   . enoxaparin  40 mg Subcutaneous Daily   . famotidine  20 mg Oral Daily    Or   . famotidine  20 mg Intravenous Daily   . folic acid  1 mg Oral Daily   . lisinopril-amLODIPine 10-5 combo dose   Oral Daily   . multivitamin  1 tablet Oral Daily   . thiamine  100 mg Oral Daily   . traZODone  50 mg Oral QHS       ROS     Remainder  of 10 point ROS as above or otherwise negative    PHYSICAL EXAM     Vitals:    05/28/18 1105   BP: 111/69   Pulse: 99   Resp: 16   Temp: 98.2 F (36.8 C)   SpO2: 97%       Temperature: Temp  Min: 98.2 F (36.8 C)  Max: 99.3 F (37.4 C)  Pulse: Pulse  Min: 94  Max: 99  Respiratory: Resp  Min: 16  Max: 20  Non-Invasive BP: BP  Min: 111/69  Max: 156/85  Pulse Oximetry SpO2  Min: 94 %  Max: 99 %    Intake and Output Summary (Last 24 hours) at Date Time    Intake/Output Summary (Last 24 hours) at 05/28/18 1336  Last data filed at 05/28/18 0600   Gross per 24 hour   Intake              210 ml   Output                0 ml   Net              210 ml       GEN APPEARANCE: A&OX 3   HEENT: PERLA; EOMI; Conjunctiva Clear  NECK: Supple; No bruits  CVS: RRR, S1, S2; No M/G/R  LUNGS: CTAB; No Wheezes; No Rhonchi: No rales  MSK: spine, well healing surgical incision site, no drainage or erythema  ABD: Soft; No TTP; + Normoactive BS  EXT: No edema; Pulses 2+ and intact  SKIN: No rash or Lesions  NEURO: CN 2-12 intact; No Focal neurological deficits      LABS       Recent Labs  Lab 05/26/18  0716 05/25/18  2057   WBC 6.83 8.30   RBC 4.07* 4.26   Hgb  12.9 13.4   Hematocrit 38.1 39.7   MCV 93.6 93.2   Platelets 235 280         Recent Labs  Lab 05/28/18  0801 05/26/18  0716 05/25/18  2057   Sodium 141 141 140   Potassium 3.8 4.2 4.3   Chloride 108 104 100   CO2 25 29 28    BUN 4* 13 16   Creatinine 0.8 0.8 0.9   Glucose 150* 102* 104*   Calcium 9.2 9.1 9.6   Magnesium 1.4* 1.5* 1.6         Recent Labs  Lab 05/25/18  2057   ALT 21   AST (SGOT) 14   Bilirubin, Total 1.2   Bilirubin, Direct 0.4   Albumin 3.8   Alkaline Phosphatase 113*         Recent Labs  Lab 05/25/18  2057   Creatine Kinase (CK) 133   Troponin I 0.01         Recent Labs  Lab 05/25/18  2057   PT INR 0.9   PT 12.1*   PTT 29       Microbiology Results     None           RADIOLOGY     Radiological Procedure personally reviewed and concur with radiologist reports unless stated otherwise.    CT Head WO Contrast   Final Result    No acute intracranial hemorrhage or mass effect identified.   Cerebral volume loss. Ventricular prominence as above.            The following dose reduction techniques were utilized: automated   exposure control  and/or adjustment of the mA and/or kV according to   patient size, and the use of iterative reconstruction technique.      Melody Haver, MD    05/26/2018 4:32 PM      XR Chest  AP Portable   Final Result      Right basilar opacity, also seen previously and likely representing   atelectasis and/or scarring. No new acute process is identified.      Carla Drape, MD    05/25/2018 10:23 PM      XR Knee 1 Or 2 Views Right   Final Result      No evidence of acute displaced fracture or dislocation.      Degenerative changes, as above.      Small knee joint effusion.      Carla Drape, MD    05/25/2018 10:20 PM      MRI Brain WO Contrast    (Results Pending)       Signed,  Randall An MD  1:36 PM 05/28/2018

## 2018-05-28 NOTE — OT Progress Note (Signed)
Occupational Therapy Note    Patient declines OT treatment session at this time 2' 7/10 pain in lumbar spine and just received lunch tray.  Pt not due for pain medication until 3:30pm.  Eudelia Bunch, OT 05/28/2018

## 2018-05-28 NOTE — Progress Notes (Signed)
PROGRESS NOTE    Date Time: 05/28/18 5:50 PM  Patient Name: Matthew [name]  Attending Physician: Doylene Bode, MD    Assessment:   Stable.    Plan:   Per Dr Marjo Bicker, acute rehab is not advised.  The Pt is instead a more suitable candidate for short term stay at a nursing facility if unable to discharge home.  Subjective:   Pt has presented for recurrent falls and AMS.  On 05/28/2018 was notified of the patient's admission. Discharge plan includes SNIF placement if needed.  Medications:     Current Facility-Administered Medications   Medication Dose Route Frequency   . DULoxetine  20 mg Oral Daily   . enoxaparin  40 mg Subcutaneous Daily   . famotidine  20 mg Oral Daily    Or   . famotidine  20 mg Intravenous Daily   . folic acid  1 mg Oral Daily   . lisinopril-amLODIPine 10-5 combo dose   Oral Daily   . magnesium oxide  400 mg Oral BID   . morphine  15 mg Oral Q12H SCH   . multivitamin  1 tablet Oral Daily   . thiamine  100 mg Oral Daily   . traZODone  50 mg Oral QHS       Physical Exam:     Vitals:    05/28/18 1615   BP: 135/75   Pulse: 96   Resp: 16   Temp: 98.1 F (36.7 C)   SpO2: 98%         Intake/Output Summary (Last 24 hours) at 05/28/18 1750  Last data filed at 05/28/18 1427   Gross per 24 hour   Intake              210 ml   Output              150 ml   Net               60 ml       Drains:          Labs:     Lab Results   Component Value Date    WBC 6.83 05/26/2018    HGB 12.9 05/26/2018    HCT 38.1 05/26/2018    MCV 93.6 05/26/2018    PLT 235 05/26/2018               Signed by: Kinlaw Redo, PA-C  Date/Time: 05/28/18 5:50 PM

## 2018-05-28 NOTE — Plan of Care (Signed)
Problem: Safety  Goal: Patient will be free from injury during hospitalization  Outcome: Progressing   05/28/18 2348   Goal/Interventions addressed this shift   Patient will be free from injury during hospitalization  Provide and maintain safe environment;Use appropriate transfer methods;Assess patient's risk for falls and implement fall prevention plan of care per policy;Include patient/ family/ care giver in decisions related to safety;Hourly rounding;Ensure appropriate safety devices are available at the bedside;Assess for patients risk for elopement and implement Elopement Risk Plan per policy;Provide alternative method of communication if needed (communication boards, writing)       Problem: Pain  Goal: Pain at adequate level as identified by patient  Outcome: Progressing   05/28/18 2348   Goal/Interventions addressed this shift   Pain at adequate level as identified by patient Identify patient comfort function goal;Assess for risk of opioid induced respiratory depression, including snoring/sleep apnea. Alert healthcare team of risk factors identified.;Assess pain on admission, during daily assessment and/or before any "as needed" intervention(s);Reassess pain within 30-60 minutes of any procedure/intervention, per Pain Assessment, Intervention, Reassessment (AIR) Cycle;Evaluate if patient comfort function goal is met;Evaluate patient's satisfaction with pain management progress;Offer non-pharmacological pain management interventions;Consult/collaborate with Pain Service;Include patient/patient care companion in decisions related to pain management as needed       Problem: Addiction to alcohol or opioids or other substances AS EVIDENCED BY:  Goal: Patient's recovery goal in his/her own words:  Outcome: Not Progressing   05/28/18 2348   Goal/Interventions addressed this shift   Patient's recovery goal in his/her own words:  Assist patient to sign acknowledgement of treatment plan participation       Comments:  Patient resting in bed, vital signs within normal limits. PRN and scheduled pain medication given with positive effect. Pt denies needs at this time. Safety precautions in place, hourly rounding continued.

## 2018-05-28 NOTE — Progress Notes (Signed)
Pt is an [facility] patient. Met with him at bedside to provide SNF list. Encouraged him to have his daughters help select a SNF for [place].    Conception Chancy, MSSW  Case Manager - [facility]  [phone])

## 2018-05-28 NOTE — Plan of Care (Signed)
Problem: Pain  Goal: Pain at adequate level as identified by patient  Discontinued sitter this AM.  Patient is alert and oriented, not impulsive.     05/28/18 1244   Goal/Interventions addressed this shift   Pain at adequate level as identified by patient Identify patient comfort function goal;Assess for risk of opioid induced respiratory depression, including snoring/sleep apnea. Alert healthcare team of risk factors identified.;Assess pain on admission, during daily assessment and/or before any "as needed" intervention(s);Reassess pain within 30-60 minutes of any procedure/intervention, per Pain Assessment, Intervention, Reassessment (AIR) Cycle;Evaluate patient's satisfaction with pain management progress;Evaluate if patient comfort function goal is met;Consult/collaborate with Physical Therapy, Occupational Therapy, and/or Speech Therapy;Consult/collaborate with Pain Service;Offer non-pharmacological pain management interventions;Include patient/patient care companion in decisions related to pain management as needed         Bed in lowest position with bed alarm activated, 3/4 guard rails raised.  Call bell and phone within reach.

## 2018-05-28 NOTE — Progress Notes (Signed)
NEUROLOGY PROGRESS NOTE    Date Time: 05/28/18 9:06 AM  Patient Name: Matthew [name]  Attending Physician: Shawnee Knapp, MD    BRIEF HISTORY:   Matthew [name] is a 77 y.o. male with a PMHx of DJD of spine, chronic back pain, lumbar stenosis s/p spinal fusion L4-S1 2016, and subsequent hardware removal 05/21/18, B/L PE, HTN, Alcohol abuse who presented with falls at home and confusion. Please note that patient is poor historian and hence hx obtained from ER records and daughter. Patient suffers from chronic back pain, most recently had spinal hardware removed on 6/12 due to persistent back pain. Since discharge from hospital, he has been staying with his daughter and getting PT services. Patient is a heavy drinker, per daughter, and has fallen at least twice at home since release from hospital recently. Daughter found almost empty bottle of Christiane Ha in patient's room-though patient denies having drank this. He reports he drinks half a glass of wine every day or every other day. Last drink was day before yesterday. Has been having tremors, and feeling anxious. He denies any hallucinations though daughter reported patient was having hallucinations. Patient fell at home yesterday morning, daughter found him on the floor. At 7pm today, daughter found patient confused and hallucinating and had another fall while trying to get up. Patient also on opioids for back pain and also takes a sleeping aid-doesn't remember the name. Currently patient c/o back pain which is not new and denies any other sx's    Chief Complaints:   Mentation improved  Back pain    Interval History/24 hour events:   Ambulating to BR  Using walker  MS better    Review Of Systems:  No headaches, dizziness, confusion, + focal weakness or numbness in leg  No deafness, tinnitus, blurred vision, or vision loss, No double vision or droopy lids  Sleep good, no anxiety or depression, memory preserved  No speech or swallowing problems  Balance fair, +  Falls  + back or neck pain  No bowel or bladder incontinence  No fever or chills        Physical Exam:     Vitals:    05/28/18 0815   BP: 156/85   Pulse: 94   Resp: 16   Temp: 98.4 F (36.9 C)   SpO2: 98%       Intake and Output Summary (Last 24 hours) at Date Time    Intake/Output Summary (Last 24 hours) at 05/28/18 0906  Last data filed at 05/28/18 0600   Gross per 24 hour   Intake              210 ml   Output                0 ml   Net              210 ml       General: No apparent distress, comfortable appearing, vital signs noted above  Head: Atraumatic and normocephalic  Eyes: Anicteric sclerae, moist conjunctiva, PERRLA   ENT: Oropharynx clear; no erythema or exudates;   Neck: supple; FROM; no mass   Extremities: No clubbing, cyanosis or edema  CV: Regular rate and rhythm, no murmurs  Lungs: clear  Skin: No rash, lesions, ulcers   Neurological Examination  Mental Status:   Alert, oriented, to time, place, person and situation   Clear speech, normal language, including fluency, naming,     Normal short term and remote  memory.  Adequate knowledge.   Affect appropriate   CN 2-12    EOM full, PERL, Normal fields and fundi, Face symmetric, tongue midline with normal bulk and no fasicualtions  Hearing, facial sensations and Gag reflex intact   Motor system: Normal tone, bulk, no involuntary movements.     Power 4/5 and  Symmetric in arms and legs.  No cog wheeling, tremors, or bradykinesia.   Sensory system: Normal touch, pin prick vibration in all 4 extremities; no sensory level   Reflexes:  Deep tendon reflexes symmetric, Negative Babinski  Cerebellar:  Normal   Gait: Walker  Spine: Reduced ROM  Neurovascular: No bruits.  Signs of meningeal irritation: None        Other pertinent positive findings:       Meds:     Current Facility-Administered Medications   Medication Dose Route Frequency   . DULoxetine  20 mg Oral Daily   . enoxaparin  40 mg Subcutaneous Daily   . famotidine  20 mg Oral Daily    Or   .  famotidine  20 mg Intravenous Daily   . folic acid  1 mg Oral Daily   . lisinopril-amLODIPine 10-5 combo dose   Oral Daily   . multivitamin  1 tablet Oral Daily   . thiamine  100 mg Oral Daily   . traZODone  50 mg Oral QHS       Labs:     Results     Procedure Component Value Units Date/Time    Basic Metabolic Panel [[id number]]  (Abnormal) Collected:  05/28/18 0801    Specimen:  Blood Updated:  05/28/18 0843     Glucose 150 (H) mg/dL      BUN 4 (L) mg/dL      Creatinine 0.8 mg/dL      Calcium 9.2 mg/dL      Sodium 409 mEq/L      Potassium 3.8 mEq/L      Chloride 108 mEq/L      CO2 25 mEq/L      Anion Gap 8.0    Magnesium [[id number]]  (Abnormal) Collected:  05/28/18 0801    Specimen:  Blood Updated:  05/28/18 0843     Magnesium 1.4 (L) mg/dL     GFR [[id number]] Collected:  05/28/18 0801     Updated:  05/28/18 0843     EGFR >60.0            Imaging personally reviewed, including:     Radiology Results (24 Hour)     ** No results found for the last 24 hours. **            Assessment:     Patient Active Problem List   Diagnosis   . Degenerative lumbar spinal stenosis   . Lumbar stenosis   . Spondylolisthesis of lumbar region   . Hypertension   . BPH (benign prostatic hypertrophy)   . History of lumbar laminectomy for spinal cord decompression   . Pulmonary embolism, bilateral   . Painful orthopaedic hardware   . Alcohol withdrawal     Delirium improved  H/O fall  Recent L spine surgery     Plan/Recommendations:   General measures:  Diet/activity/Precuations: Per routine from admitting orders  Symptomatic measures: as needed  Diagnostics:Reviewed  Medications:Noted  Consultation:Noted  Physical/Occupational/Speech Therapy:As needed  Patient/Family Education/Counseling:Provided        Anticipated discharge disposition and date: Unknown     Case discussed with: Nursing/Attending  Signed by: Beckie Salts, MD

## 2018-05-28 NOTE — Progress Notes (Addendum)
Pt's sitter was discontinued this morning (documentation at 1249).      He chose the following SNFs (in no particular order): [facility], [facility], [facility] and [facility].  Referrals sent.    Addendum: Spoke with daughter Ian Malkin to discuss DCP. She will likely want to pick pt up for [facility] at 1300 tomorrow and will need to leave right away.  However she wanted to try to get [facility] to approve pt to go to [facility].  She will call [facility] to discuss the bundle and his rehab options.    Conception Chancy, MSSW  Case Manager - [facility]  [phone])

## 2018-05-28 NOTE — PT Progress Note (Signed)
Physical Therapy Note    [facility]  Physical Therapy Treatment    Patient:  Matthew [name] MRN#:  [id number]  Unit:  [facility] Room/Bed:  B147/W295-62      Recommendation  Discharge Recommendation:  (concur with SNF as plan documented in CM note)  PT - Next Visit Recommendation: 05/29/18  PT Frequency: 4-5x/wk    Recommended mode of transportation at discharge:   car    PMP - Progressive Mobility Protocol   PMP Activity: Step 6 - Walks in Room  Distance Walked (ft) (Step 6,7): 100 Feet    Assessment:   After today's treatment patient able to ambulate in room steadily with rolling walker.  Pt continues to need verbal cueing for safety with bed mobility and transfers.  Noted new plan for d/c to SNF; PT in agreement.           Interdisciplinary Communication:   Patient up in bed with alarm activated; call bell within reach. Updated white communication board in room with patient's current mobility status.  Communicated with nsg, via verbal communciation regarding pt activity.    Education:   Further education provided to patient on  HEP, safety with mobility and ADLs, spine precautions, falls prevention.  Demonstrated good understanding.  Will benefit from further training to focus on safe mobility, strength & endurance.      Plan:   Goals  Goal Formulation: With patient  Time for Goal Acheivement: 5 visits  Pt Will Perform Sit To Supine: independent;Partly met  Pt Will Perform Sit to Stand: independent  Pt Will Transfer Bed/Chair: with rolling walker;independent  Pt Will Ambulate: 51-100 feet;with rolling walker;independent  Plan  Risks/Benefits/POC Discussed with Pt/Family: With patient  Patient Goal: get stronger  Treatment/Interventions: Functional transfer training;Gait training;Bed mobility  PT Frequency: 4-5x/wk    Continue plan of care.    ------------------------------------------------------------------------------------------------------------------    Time of treatment:   Start Time: 1110  Stop Time: 1138  Time Calculation (min): 28 min  PT Received On: 05/28/18      Treatment #        Precautions  Weight Bearing Status: no restrictions  Other Precautions: falls, monitor CIWA          Patient's medical condition is appropriate for Physical Therapy intervention at this time.     Subjective:   Patient is agreeable to participation in the therapy session.  Patient Goal: get stronger   Pain Assessment  Pain Assessment:  (8/10 pain in back)          Objective:  Patient is in bed with telemetry and peripheral IV in place.                                                               Cognition/Neuro Status  Arousal/Alertness: Appropriate responses to stimuli  Attention Span: Appears intact  Following Commands: Follows all commands and directions without difficulty    Functional Mobility  Rolling: Supervision (verbal cues for proper technique)  Supine to Sit: Supervision (verbal cues for technique with logroll follow through)  Sit to Supine: Supervision (verbal cues for technique)  Sit to Stand: Supervision;with instruction for hand placement to increase safety  Stand to Sit: Supervision         Locomotion  Ambulation: Stand  by Assist;with front-wheeled walker  Pattern:  (steady, no LOB, cautious pacing)    Therapeutic Exercise  Hip Flexion: 15 (standing BLE AROM)  Knee AROM : 15 (BLE AROM LAQ)  Ankle Pumps: 15 (BLE AROM)                  Signature: Kari Baars, MPT

## 2018-05-29 MED ORDER — MAGNESIUM OXIDE 400 MG TABS (WRAP)
400.00 mg | ORAL_TABLET | Freq: Two times a day (BID) | ORAL | 0 refills | Status: AC
Start: 2018-05-29 — End: ?

## 2018-05-29 MED ORDER — DSS 100 MG PO CAPS
100.00 mg | ORAL_CAPSULE | Freq: Two times a day (BID) | ORAL | 0 refills | Status: AC | PRN
Start: 2018-05-29 — End: ?

## 2018-05-29 MED ORDER — HYDROMORPHONE HCL 2 MG PO TABS
2.00 mg | ORAL_TABLET | ORAL | 0 refills | Status: AC | PRN
Start: 2018-05-29 — End: ?

## 2018-05-29 MED ORDER — THIAMINE HCL 100 MG PO TABS
100.00 mg | ORAL_TABLET | Freq: Every day | ORAL | 0 refills | Status: AC
Start: 2018-05-29 — End: ?

## 2018-05-29 MED ORDER — FOLIC ACID 1 MG PO TABS
1.00 mg | ORAL_TABLET | Freq: Every day | ORAL | 0 refills | Status: AC
Start: 2018-05-29 — End: ?

## 2018-05-29 MED ORDER — MORPHINE SULFATE ER 15 MG PO TBEA
15.00 mg | EXTENDED_RELEASE_TABLET | Freq: Every day | ORAL | 0 refills | Status: AC
Start: 2018-05-29 — End: ?

## 2018-05-29 MED ORDER — TAB-A-VITE/BETA CAROTENE PO TABS
1.00 | ORAL_TABLET | Freq: Every day | ORAL | 0 refills | Status: AC
Start: 2018-05-29 — End: ?

## 2018-05-29 NOTE — Discharge Instr - AVS First Page (Signed)
Reason for your Hospital Admission:  Alcohol Withdrawal      Instructions for after your discharge:  Advised to refrain from drinking any alcohol on discharge. Please follow up with Dr. Marjo Bicker as scheduled after discharge.

## 2018-05-29 NOTE — Discharge Summary (Signed)
[facility]      Patient: Matthew [name]  Admission Date: 05/25/2018   DOB: 1941-03-04  Discharge Date: 05/29/2018    MRN: [id number]  Discharge Attending: Randall An MD   Referring Physician: Bobby Rumpf, MD  PCP: Bobby Rumpf, MD       DISCHARGE SUMMARY     Discharge Information   Admission Diagnosis:   Etoh withdrawal     Discharge Diagnosis:   Etoh Withdrawal   Encephalopathy  HTN Crisis  Fall   Chronic back pain s/p prior L4-S1 fusion in 2016, and subsequent hardware removal 05/21/18  Hypomagnesemia   Depression & Anxiety      Admission Condition: fair  Discharge Condition: good  Functional Status: Patient is not independent with mobility/ambulation, transfers, ADL's, IADL's.    Discharge Medications:     Medication List      START taking these medications    docusate sodium 100 MG capsule  Commonly known as:  COLACE  Take 1 capsule (100 mg total) by mouth 2 (two) times daily as needed for Constipation     folic acid 1 MG tablet  Commonly known as:  FOLVITE  Take 1 tablet (1 mg total) by mouth daily     magnesium oxide 400 MG tablet  Commonly known as:  MAG-OX  Take 1 tablet (400 mg total) by mouth 2 (two) times daily     multivitamin Tabs  Take 1 tablet by mouth daily     thiamine 100 MG tablet  Commonly known as:  B-1  Take 1 tablet (100 mg total) by mouth daily        CHANGE how you take these medications    Morphine Sulfate ER 15 MG Tbea  Take 15 mg by mouth daily  What changed:  how much to take        CONTINUE taking these medications    amlodipine-benazepril 10-20 MG per capsule  Commonly known as:  LOTREL  Take 1 capsule of your 5mg -10mg  dose capsule. This was prescribed by your primacy care provider.     cyclobenzaprine 5 MG tablet  Commonly known as:  FLEXERIL  Take 1 tablet (5 mg total) by mouth 3 (three) times daily as needed for Muscle spasms     DULoxetine 20 MG capsule  Commonly known as:  CYMBALTA     HYDROmorphone 2 MG tablet  Commonly known as:  DILAUDID  Take  1 tablet (2 mg total) by mouth every 4 (four) hours as needed for Pain     IMODIUM PO     naproxen sodium 220 MG tablet  Commonly known as:  ANAPROX     pramipexole 0.5 MG tablet  Commonly known as:  MIRAPEX     traZODone 50 MG tablet  Commonly known as:  DESYREL        STOP taking these medications    ALPRAZolam 0.5 MG tablet  Commonly known as:  XANAX     LOMOTIL PO     naloxone 4 MG/0.1ML nasal spray  Commonly known as:  NARCAN     oxyCODONE-acetaminophen 5-325 MG per tablet  Commonly known as:  PERCOCET           Where to Get Your Medications      You can get these medications from any pharmacy    Bring a paper prescription for each of these medications   docusate sodium 100 MG capsule   folic acid 1 MG tablet   HYDROmorphone 2 MG  tablet   magnesium oxide 400 MG tablet   Morphine Sulfate ER 15 MG Tbea   multivitamin Tabs   thiamine 100 MG tablet          Patient Lines/Drains/Airways Status    Active PICC Line / CVC Line / PIV Line / Drain / Airway / Intraosseous Line / Epidural Line / ART Line / Line / Wound / Pressure Ulcer / NG/OG Tube     Name:   Placement date:   Placement time:   Site:   Days:    Peripheral IV 05/26/18 Left Antecubital  05/26/18        Antecubital    3    Peripheral IV 05/26/18 Right Forearm  05/26/18    0213    Forearm    3                   Hospital Course   Presentation History   Matthew [name] is a 77 y.o. male with a PMHx of DJD of spine, chronic back pain, lumbar stenosis s/p spinal fusion L4-S1 2016, and subsequent hardware removal 05/21/18, B/L PE, HTN, Alcohol abuse who presented with falls at home and confusion. Please note that patient is poor historian and hence hx obtained from ER records and daughter. Patient suffers from chronic back pain, most recently had spinal hardware removed on 6/12 due to persistent back pain. Since discharge from hospital, he has been staying with his daughter and getting PT services. Patient is a heavy drinker, per daughter, and has fallen at  least twice at home since release from hospital recently. Daughter found almost empty bottle of Christiane Ha in patient's room-though patient denies having drank this. He reports he drinks half a glass of wine every day or every other day. Last drink was day before yesterday. Has been having tremors, and feeling anxious. He denies any hallucinations though daughter reported patient was having hallucinations. Patient fell at home yesterday morning, daughter found him on the floor. At 7pm today, daughter found patient confused and hallucinating and had another fall while trying to get up. Patient also on opioids for back pain and also takes a sleeping aid-doesn't remember the name. Currently patient c/o back pain which is not new and denies any other sx's    Hospital Course (4 Days)     Etoh Withdrawal   - Treated w/IV ativan and Librium  - Multivitamin, Thiamine, Folic Acid   - pt denies Etoh abuse, however daughter reports etoh abuse    Encephalopathy   - Toxic 2' etoh use, resolved  - CT head showed possible NPH. Clinically pt does not have NPH. Appreciate Neuro consult. Unable to perform MRI due to spinal stimulator.     HTN Crisis  - 2' etoh withdrawal. Back to baseline   - Continue home medication    Fall   - 2' suspected etoh abuse    Chronic back pain s/p prior L4-S1 fusion in 2016, and subsequent hardware removal 05/21/18  - Follow up with Dr. Marjo Bicker as regularly scheduled  - Continue MS Contin and Dilaudid PO PRN, regimen observed in hospital with normal mentation.     Hypomagnesemia    - 2' etoh abuse, mag ox 400mg  BID on discharge    Depression & Anxiety  -Cymbalta     Procedures/Imaging:   CT Head WO Contrast   Final Result    No acute intracranial hemorrhage or mass effect identified.   Cerebral volume loss. Ventricular prominence as  above.            The following dose reduction techniques were utilized: automated   exposure control and/or adjustment of the mA and/or kV according to   patient size, and  the use of iterative reconstruction technique.      Melody Haver, MD    05/26/2018 4:32 PM      XR Chest  AP Portable   Final Result      Right basilar opacity, also seen previously and likely representing   atelectasis and/or scarring. No new acute process is identified.      Carla Drape, MD    05/25/2018 10:23 PM      XR Knee 1 Or 2 Views Right   Final Result      No evidence of acute displaced fracture or dislocation.      Degenerative changes, as above.      Small knee joint effusion.      Carla Drape, MD    05/25/2018 10:20 PM      MRI Brain WO Contrast    (Results Pending)       Treatment Team:   Attending Provider: Doylene Bode, MD  Consulting Physician: Shelby Mattocks, MD  Consulting Physician: Shawnee Knapp, MD       Best Practices   Was the patient admitted with either a CHF Exacerbation or Pneumonia? No     Progress Note/Physical Exam at Discharge     Subjective: No complaints.     Vitals:    05/28/18 1936 05/28/18 2334 05/29/18 0423 05/29/18 0754   BP: 146/76 135/74 127/75 123/72   Pulse: 75 76 82 79   Resp: 18 18 18 16    Temp: 98.5 F (36.9 C) 98.6 F (37 C) 97.9 F (36.6 C) 98.4 F (36.9 C)   TempSrc: Oral Oral Oral Oral   SpO2: 97% 97% 97% 98%   Weight:       Height:           General: NAD, AAOx3  HEENT: perrla, eomi, sclera anicteric, OP: Clear, MMM  Neck: supple, FROM, no LAD  Cardiovascular: RRR, no m/r/g  Lungs: CTAB, no w/r/r  Abdomen: soft, +BS, NT/ND, no masses, no g/r  Extremities: no C/C/E  Skin: Vertical well healed incision along lower back , no drainage   Neuro: CN 2-12 intact; No Focal neurological deficits       Diagnostics     Labs/Studies Pending at Discharge: No    Last Labs     Recent Labs  Lab 05/26/18  0716 05/25/18  2057   WBC 6.83 8.30   RBC 4.07* 4.26   Hgb 12.9 13.4   Hematocrit 38.1 39.7   MCV 93.6 93.2   Platelets 235 280         Recent Labs  Lab 05/28/18  0801 05/26/18  0716 05/25/18  2057   Sodium 141 141 140   Potassium 3.8 4.2 4.3   Chloride 108 104 100    CO2 25 29 28    BUN 4* 13 16   Creatinine 0.8 0.8 0.9   Glucose 150* 102* 104*   Calcium 9.2 9.1 9.6   Magnesium 1.4* 1.5* 1.6       Microbiology Results     None           Patient Instructions   Discharge Diet: regular diet  Discharge Activity:  activity as tolerated    Follow Up Appointment:  Follow-up Information  Clelia Schaumann, MD. Schedule an appointment as soon as possible for a visit in 1 week(s).    Specialty:  Orthopaedic Surgery  Why:  Follow up for recent back surgery   Contact information:  [address]  [address]  [place] [address]  [phone]                    Time spent examining patient, discussing with patient/family regarding hospital course, chart review, reconciling medications and discharge planning: 45 minutes.    Signed,  Randall An, MD  9:27 AM 05/29/2018

## 2018-05-29 NOTE — Progress Notes (Signed)
NEUROLOGY PROGRESS NOTE    Date Time: 05/29/18 4:03 PM  Patient Name: Matthew [name]  Attending Physician: Shawnee Knapp, MD    BRIEF HISTORY:   Matthew [name] is a 77 y.o. male with a PMHx of DJD of spine, chronic back pain, lumbar stenosis s/p spinal fusion L4-S1 2016, and subsequent hardware removal 05/21/18, B/L PE, HTN, Alcohol abuse who presented with falls at home and confusion. Please note that patient is poor historian and hence hx obtained from ER records and daughter. Patient suffers from chronic back pain, most recently had spinal hardware removed on 6/12 due to persistent back pain. Since discharge from hospital, he has been staying with his daughter and getting PT services. Patient is a heavy drinker, per daughter, and has fallen at least twice at home since release from hospital recently. Daughter found almost empty bottle of Christiane Ha in patient's room-though patient denies having drank this. He reports he drinks half a glass of wine every day or every other day. Last drink was day before yesterday. Has been having tremors, and feeling anxious. He denies any hallucinations though daughter reported patient was having hallucinations. Patient fell at home yesterday morning, daughter found him on the floor. At 7pm today, daughter found patient confused and hallucinating and had another fall while trying to get up. Patient also on opioids for back pain and also takes a sleeping aid-doesn't remember the name. Currently patient c/o back pain which is not new and denies any other sx's    Chief Complaints:   Mentation improved  Back pain    Interval History/24 hour events:   Ambulating to BR  Using walker  MS better    Review Of Systems:  No headaches, dizziness, confusion, + focal weakness or numbness in leg  No deafness, tinnitus, blurred vision, or vision loss, No double vision or droopy lids  Sleep good, no anxiety or depression, memory preserved  No speech or swallowing problems  Balance fair, +  Falls  + back or neck pain  No bowel or bladder incontinence  No fever or chills        Physical Exam:     Vitals:    05/29/18 1128   BP: 106/66   Pulse: 96   Resp: 16   Temp: 98.2 F (36.8 C)   SpO2: 99%       Intake and Output Summary (Last 24 hours) at Date Time    Intake/Output Summary (Last 24 hours) at 05/29/18 1603  Last data filed at 05/28/18 2355   Gross per 24 hour   Intake                0 ml   Output              700 ml   Net             -700 ml       General: No apparent distress, comfortable appearing, vital signs noted above  Head: Atraumatic and normocephalic  Eyes: Anicteric sclerae, moist conjunctiva, PERRLA   ENT: Oropharynx clear; no erythema or exudates;   Neck: supple; FROM; no mass   Extremities: No clubbing, cyanosis or edema  CV: Regular rate and rhythm, no murmurs  Lungs: clear  Skin: No rash, lesions, ulcers   Neurological Examination  Mental Status:   Alert, oriented, to time, place, person and situation   Clear speech, normal language, including fluency, naming,     Normal short term and remote memory.  Adequate knowledge.   Affect appropriate   CN 2-12    EOM full, PERL, Normal fields and fundi, Face symmetric, tongue midline with normal bulk and no fasicualtions  Hearing, facial sensations and Gag reflex intact   Motor system: Normal tone, bulk, no involuntary movements.     Power 4/5 and  Symmetric in arms and legs.  No cog wheeling, tremors, or bradykinesia.   Sensory system: Normal touch, pin prick vibration in all 4 extremities; no sensory level   Reflexes:  Deep tendon reflexes symmetric, Negative Babinski  Cerebellar:  Normal   Gait: Walker  Spine: Reduced ROM  Neurovascular: No bruits.  Signs of meningeal irritation: None        Other pertinent positive findings:       Meds:     Current Facility-Administered Medications   Medication Dose Route Frequency   . DULoxetine  20 mg Oral Daily   . enoxaparin  40 mg Subcutaneous Daily   . famotidine  20 mg Oral Daily    Or   .  famotidine  20 mg Intravenous Daily   . folic acid  1 mg Oral Daily   . lisinopril-amLODIPine 10-5 combo dose   Oral Daily   . magnesium oxide  400 mg Oral BID   . morphine  15 mg Oral Q12H SCH   . multivitamin  1 tablet Oral Daily   . thiamine  100 mg Oral Daily   . traZODone  50 mg Oral QHS       Labs:     Results     ** No results found for the last 24 hours. **            Imaging personally reviewed, including:     Radiology Results (24 Hour)     ** No results found for the last 24 hours. **            Assessment:     Patient Active Problem List   Diagnosis   . Degenerative lumbar spinal stenosis   . Lumbar stenosis   . Spondylolisthesis of lumbar region   . Hypertension   . BPH (benign prostatic hypertrophy)   . History of lumbar laminectomy for spinal cord decompression   . Pulmonary embolism, bilateral   . Painful orthopaedic hardware   . Alcohol withdrawal     Delirium improved  H/O fall  Recent L spine surgery     Plan/Recommendations:   General measures:  Diet/activity/Precuations: Per routine from admitting orders  Symptomatic measures: as needed  Diagnostics:Reviewed  Medications:Noted  Consultation:Noted  Physical/Occupational/Speech Therapy:As needed  Patient/Family Education/Counseling:Provided        Anticipated discharge disposition and date: Unknown     Case discussed with: Nursing/Attending        Signed by: Shawnee Knapp, MD

## 2018-05-29 NOTE — PT Progress Note (Signed)
Physical Therapy Note    [facility]  Physical Therapy Attempt Note    Patient:  Matthew [name] MRN#:  [id number]  Unit:  [facility] Room/Bed: J191/Y782-95      Attempted to see pt this am.  Pt declines PT 2/2 reported groin pain.  Nursing aware.  Noted likely plan for d/c today.  Will continue to follow pending d/c.    Kari Baars, MPT

## 2018-05-29 NOTE — Hospital Course (Signed)
Relevant Summary for Hospital Course:

## 2018-05-29 NOTE — Progress Notes (Signed)
[facility] SNF agreed to accept pt today.  Met with pt and daughter (along with [facility] Director of Admissions Belenda Cruise) at bedside to finalize plan.  [facility] paperwork faxed to [facility]. Daughter will transport.    Rm 232  Report [phone]  Fax [phone]    Conception Chancy, MSSW  Case Manager - [facility] 4C  [phone])

## 2018-05-29 NOTE — Consults (Signed)
Service Date: 05/27/2018     Patient Type: I     CONSULTING PHYSICIAN: Shawnee Knapp MD     REFERRING PHYSICIAN: Doylene Bode MD     TIME OF CONSULTATION:  10:15 a.m.     REASON FOR CONSULTATION:  Altered mental status.     HISTORY OF PRESENT ILLNESS:  The patient is a 77 year old man with a history of hypertension, alcohol  abuse, osteoarthritis, lumbar spondylosis, spinal stenosis status post  fusion at L4 to S1 in 2016.  subsequent hardware removal on May 21, 2018, history of hypertension, bilateral PE, who comes into the  hospital with a fall at home and confusion.  The patient's main complaint  is that of back pain radiating down his right leg.  He has chronic pain.   He refers to pain, which is since his surgery, but most likely pain is  chronic.  Per family, he is a heavy drinker.  He was noted to have altered  mental status.  Neurology is asked to evaluate.  According to nursing  staff, his mentation fluctuates.  According to the attending physician, the  patient's mentation was worse yesterday and he was having hallucinations  upon admission.  Currently, the patient denies any headaches, confusion,  slurred speech, focal weakness.  His head CT is negative.  The patient  cannot have MRI due to spinal stimulator.     PAST MEDICAL HISTORY:  Hypertension, hyperlipidemia, osteoarthritis, BPH, anxiety, fibromyalgia,  alcohol abuse, history of pancreatitis, spinal stenosis, spondylitis,  history of syncopal episodes in the past.     PAST SURGICAL HISTORY:  Appendectomy, cervical fusion in 1997, cholecystectomy, ERCP and a stent  placement, lumbar laminectomy and fusion in December 2016, removal of  hardware and surgery less than a week ago, prostatectomy, tonsillectomy,  vasectomy.     FAMILY HISTORY:  Noncontributory.     SOCIAL HISTORY:  Nonsmoker, heavy drinker.  Lives by himself.     CURRENT MEDICATIONS:  See profile, includes extended release morphine, Cymbalta, Flexeril,  Lovenox, Pepcid, Folvite,  amlodipine, thiamine, multivitamins, trazodone.     ALLERGIES:  None known.     REVIEW OF SYSTEMS:  A 10-point review of systems is negative other than covered above.   Positive for back pain.  No bleeding or thrombosis.  Positive anxiety.   History of deep venous thrombosis, history of PE, back surgery, chronic  pain syndrome.  The patient denies any history of heart attacks, cancer,  strokes or seizures.     PHYSICAL EXAMINATION:  VITAL SIGNS:  Temperature 98.2, pulse 96, respirations 16, blood pressure  98/60, oxygen saturation 95%.  GENERAL:  The patient is a pleasant, cooperative, elderly man lying in bed  in no acute distress.  HEAD:  No external signs of trauma.  NECK:  Decreased range of motion.  ENT:  Unremarkable.  SKIN:  Warm, dry.  EXTREMITIES:  Without clubbing, cyanosis or edema.  Peripheral pulses  intact.  NEUROLOGIC:  Shows the patient to be awake, alert, oriented to time, place,  person, but not exactly to situation.  Speech is clear.  Language is  normal.  Extraocular movements full, face symmetric, tongue midline.   Patient is able to move all 4 extremities against gravity.  Negative  Babinski.  Sensation intact to touch and tickle.  HEART:  Sounds normal, no carotid bruits, no vertebral artery bruits, no  signs of meningeal irritation.     LABORATORY DATA:  Hemoglobin 12.9, hematocrit 38.1; white cell count  6.8; platelet count  235,000.  Sodium 141, potassium 4.2, chloride 104, bicarbonate 29, BUN 13,  creatinine 0.8, random glucose 102.  INR 0.9, PTT 29.  UA negative.       IMAGING DATA:  CT scan of the brain negative for any acute intracranial process, atrophy  and ventricular prominence noted.     IMPRESSION:  A 77 year old with history of hypertension, dyslipidemia, benign prostatic  hypertrophy, anxiety, spondylotic disease, chronic pain, comes in with  confusion and fall at home, recent surgery on May 21, 2018 to have spinal  hardware removed.  The patient's confusion appears to be  improving.  He was  also having hallucinations earlier.  Suspect symptoms to be alcohol  withdrawal syndrome.  No evidence to support stroke.  Head CT is negative.   Atrophy is age-related.     RECOMMENDATIONS:  Physical and occupational therapy evaluation.  Symptomatic treatment of  pain.  Counseling in regards to alcohol use.  DVT and aspiration  precautions.  Continue general medical and supportive care.  Serial  neurological exam.     Diagnosis, recommendation and plan of care discussed with the patient.   Care coordinated with the nursing staff, attending/referring physician.   Plan is to follow patient clinically.  Additional recommendation will  depend upon clinical course.           D:  05/27/2018 22:18 PM by Dr. Shawnee Knapp, MD ([id number])  T:  05/29/2018 03:15 AM by NTS      Everlean Cherry: [id number]) (Doc ID: [id number])

## 2018-05-29 NOTE — Progress Notes (Signed)
[facility] SNFs have agreed to accept pt.      Called pt's daughter Ian Malkin to f/u about DCP. Left voicemail.    Spoke to pt at bedside. He indicated [facility] and [facility] is 2nd choice.    Conception Chancy, MSSW  Case Manager - [facility] 4C  [phone])

## 2018-05-29 NOTE — OT Progress Note (Signed)
Occupational Therapy Note    Attempted to see pt for OT tx.  Spoke w/ RN.  RN currently getting discharge paperwork ready for pt.  Pt scheduled to d/c to SNF.    Marilynne Drivers, [place] 05/29/2018 2:43 PM

## 2018-05-29 NOTE — Plan of Care (Signed)
Problem: Safety  Goal: Patient will be free from injury during hospitalization   05/29/18 1440   Goal/Interventions addressed this shift   Patient will be free from injury during hospitalization  Assess patient's risk for falls and implement fall prevention plan of care per policy;Provide and maintain safe environment;Use appropriate transfer methods;Ensure appropriate safety devices are available at the bedside;Hourly rounding;Include patient/ family/ care giver in decisions related to safety;Assess for patients risk for elopement and implement Elopement Risk Plan per policy     Bed in lowest position with 3/4 guard rails raised, bed alarm activated.  Call bell and phone within reach.

## 2018-05-29 NOTE — Progress Notes (Addendum)
RN called receiving facility to give report.  Staff member Harege explained to RN that there had been a mistake with room placement, and admissions needs to find a new room for patient.  Harege says that admissions will call RN back.  RN not able to give report at this time.    Addendum 1640, report given to Darin Engels.     PIV and telemetry removed from patient.  All belongings returned to patient.  AVS reviewed with patient and patient's daughter.  Signature obtained and placed in chart. Discharge packet taken with patient and daughter, including prescriptions. Patient transported to daughter's car via wheelchair without incident.

## 2018-05-30 NOTE — Retrospective Coding Query (Signed)
PHYSICIAN'S DOCUMENTATION                                                                      REQUEST                                                                         Date of Request:  05/30/2018  Type of Request:  DOCUMENTATION CLARIFICATION                                         Patient Name: Matthew [name], Matthew [name]  Account #: [id number]  MR #: [id number]  Discharge Date: 05/21/2018      Dear Dr.  Edman Circle Halima Fogal   [id number]     Medical Record Reflects    Patient admitted for removal of painful hardware after fusion.      A nutritional consult was ordered.  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  Patient also had BMI of 19.5.  Ensure with meals was started.         Request to Physician    Please review the Nutrition Consult/Malnutrition Assessment and clarify if you agree with their nutritional diagnosis or if there is another diagnosis that more accurately describes the patient's condition.    PHYSICIAN RESPONSE:  Agreed              Coder Benjamine Mola  Date 05/30/2018

## 2019-05-27 LAB — VITAMIN B1, WHOLE BLOOD: Whole Blood Vitamin B1: 194.9 nmol/L (ref 66.5–200.0)

## 2019-05-27 LAB — CBC AND DIFFERENTIAL
Baso(Absolute): 0 10*3/uL (ref 0.0–0.2)
Baso(Absolute): 0.1 10*3/uL (ref 0.0–0.2)
Basos: 0 %
Basos: 0 %
Eos: 0 %
Eos: 1 %
Eosinophils Absolute: 0 10*3/uL (ref 0.0–0.4)
Eosinophils Absolute: 0.1 10*3/uL (ref 0.0–0.4)
Hematocrit: 41 % (ref 37.5–51.0)
Hematocrit: 45.1 % (ref 37.5–51.0)
Hemoglobin: 13.6 g/dL (ref 13.0–17.7)
Hemoglobin: 14.8 g/dL (ref 13.0–17.7)
Immature Granulocytes Absolute: 0 10*3/uL (ref 0.0–0.1)
Immature Granulocytes Absolute: 0.1 10*3/uL (ref 0.0–0.1)
Immature Granulocytes: 0 %
Immature Granulocytes: 1 %
Lymphocytes Absolute: 1.8 10*3/uL (ref 0.7–3.1)
Lymphocytes Absolute: 3.5 10*3/uL — ABNORMAL HIGH (ref 0.7–3.1)
Lymphocytes: 16 %
Lymphocytes: 26 %
MCH: 31.6 pg (ref 26.6–33.0)
MCH: 31.2 pg (ref 26.6–33.0)
MCHC: 33.2 g/dL (ref 31.5–35.7)
MCHC: 32.8 g/dL (ref 31.5–35.7)
MCV: 95 fL (ref 79–97)
MCV: 95 fL (ref 79–97)
Monocytes Absolute: 0.5 10*3/uL (ref 0.1–0.9)
Monocytes Absolute: 1.1 10*3/uL — ABNORMAL HIGH (ref 0.1–0.9)
Monocytes: 4 %
Monocytes: 8 %
Neutrophils Absolute: 9.1 10*3/uL — ABNORMAL HIGH (ref 1.4–7.0)
Neutrophils Absolute: 8.6 10*3/uL — ABNORMAL HIGH (ref 1.4–7.0)
Neutrophils: 80 %
Neutrophils: 64 %
Platelets: 245 10*3/uL (ref 150–379)
Platelets: 408 10*3/uL — ABNORMAL HIGH (ref 150–379)
RBC: 4.31 x10E6/uL (ref 4.14–5.80)
RBC: 4.74 x10E6/uL (ref 4.14–5.80)
RDW: 13.6 % (ref 12.3–15.4)
RDW: 13.7 % (ref 12.3–15.4)
WBC: 11.5 10*3/uL — ABNORMAL HIGH (ref 3.4–10.8)
WBC: 13.4 10*3/uL — ABNORMAL HIGH (ref 3.4–10.8)

## 2019-05-27 LAB — COMPREHENSIVE METABOLIC PANEL
ALT: 15 IU/L (ref 0–44)
AST (SGOT): 11 IU/L (ref 0–40)
Albumin/Globulin Ratio: 1.8 (ref 1.2–2.2)
Albumin: 4.3 g/dL (ref 3.5–4.8)
Alkaline Phosphatase: 94 IU/L (ref 39–117)
BUN / Creatinine Ratio: 21 (ref 10–24)
BUN: 19 mg/dL (ref 8–27)
Bilirubin, Total: 1 mg/dL (ref 0.0–1.2)
CO2: 27 mmol/L (ref 20–29)
Calcium: 10.3 mg/dL — ABNORMAL HIGH (ref 8.6–10.2)
Chloride: 99 mmol/L (ref 96–106)
Creatinine: 0.92 mg/dL (ref 0.76–1.27)
EGFR: 80 mL/min/{1.73_m2} (ref >59)
EGFR: 92 mL/min/{1.73_m2} (ref >59)
Globulin, Total: 2.4 g/dL (ref 1.5–4.5)
Glucose: 108 mg/dL — ABNORMAL HIGH (ref 65–99)
Potassium: 5.3 mmol/L — ABNORMAL HIGH (ref 3.5–5.2)
Protein, Total: 6.7 g/dL (ref 6.0–8.5)
Sodium: 141 mmol/L (ref 134–144)

## 2019-05-27 LAB — PSA TOTAL (REFLEX TO FREE): Prostate Specific Antigen, Total: 8.8 ng/mL — ABNORMAL HIGH (ref 0.0–4.0)

## 2019-05-27 LAB — VITAMIN B12+FOLATE
Folate: >20 ng/mL (ref >3.0)
Vitamin B-12: 526 pg/mL (ref 232–1245)

## 2019-05-27 LAB — URINE CULTURE: Result 1:: NO GROWTH

## 2019-05-27 LAB — HEMOGLOBIN A1C: Hemoglobin A1C: 5.7 % — ABNORMAL HIGH (ref 4.8–5.6)

## 2019-05-27 LAB — REFLEX - %FPSA
PSA, Free Pct: 16.4 %
PSA, Free: 1.44 ng/mL

## 2019-05-27 LAB — LIPID PANEL, WITHOUT TOTAL CHOLESTEROL/HDL RATIO, SERUM
Cholesterol: 153 mg/dL (ref 100–199)
HDL: 52 mg/dL (ref >39)
LDL Calculated: 83 mg/dL (ref 0–99)
Triglycerides: 92 mg/dL (ref 0–149)
VLDL Calculated: 18 mg/dL (ref 5–40)

## 2019-05-27 LAB — TSH: TSH: 1.23 u[IU]/mL (ref 0.450–4.500)

## 2019-12-09 IMAGING — CT CT L SPINE W/O CM
3 of 8 series · 11 of 33 positions shown, 13 images · non-contrast
Comparison: 03/05/2017 lumbar myelogram

CLINICAL DATA: Right leg pain.  Low back pain for 2 years.

EXAM:
CT LUMBAR SPINE WITHOUT CONTRAST
TECHNIQUE: Multidetector CT imaging of the lumbar spine was performed without
intravenous contrast administration. Multiplanar CT image
reconstructions were also generated.

[Series 2: l spine soft · axial · 0.32mm/px · z∈[+783,+930]mm · 3 of 87 slices shown, 4 images]
[im 25/87  soft-tissue]
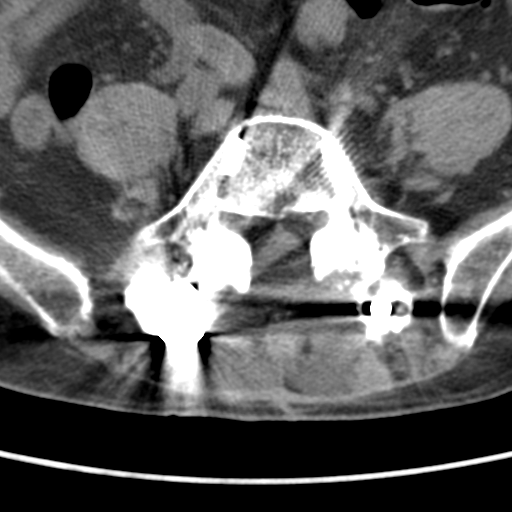
[im 25/87  bone]
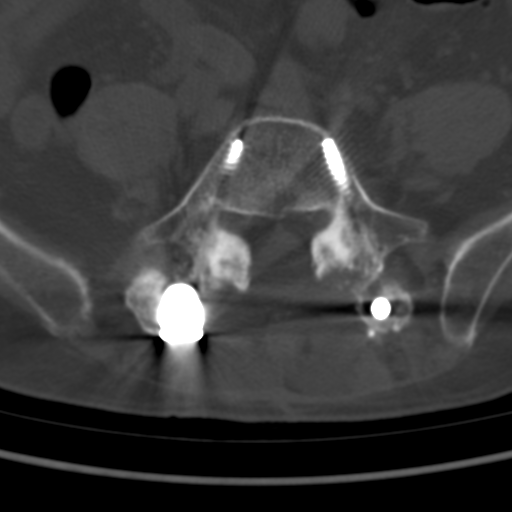
[im 50/87  bone]
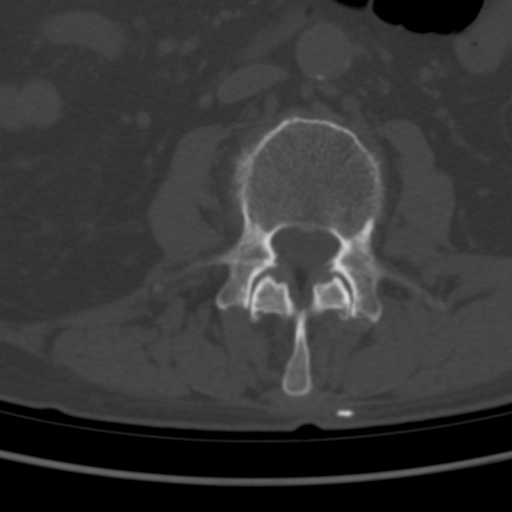
[im 74/87  bone]
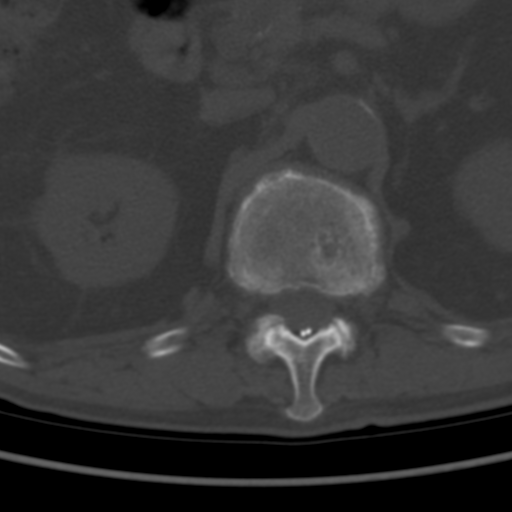

[Series 6: bone cor · coronal · 0.31mm/px · 3 of 47 slices shown]
[im 10/47  bone]
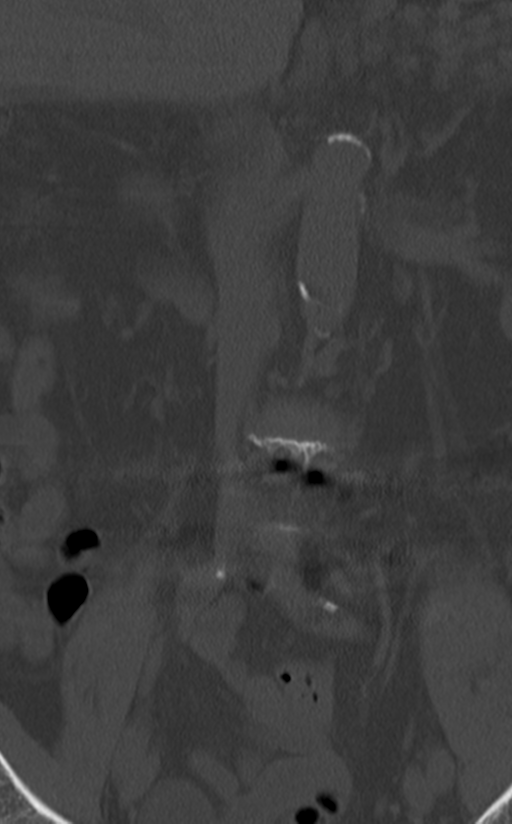
[im 19/47  bone]
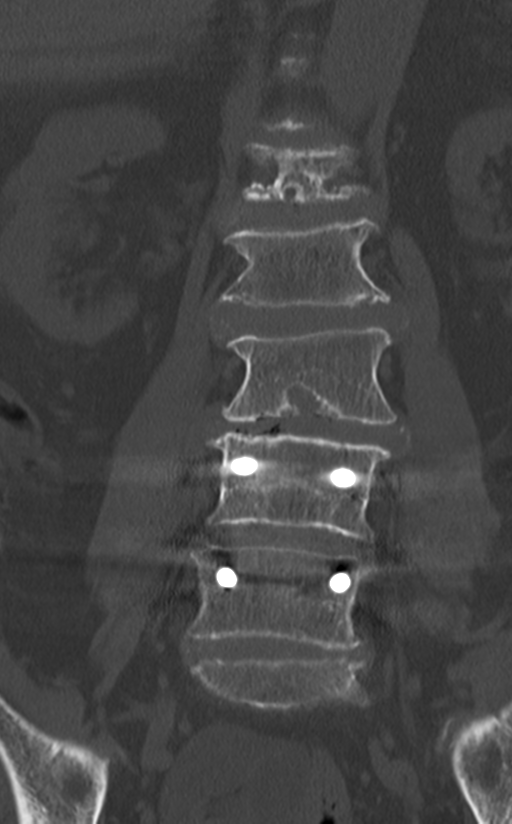
[im 28/47  bone]
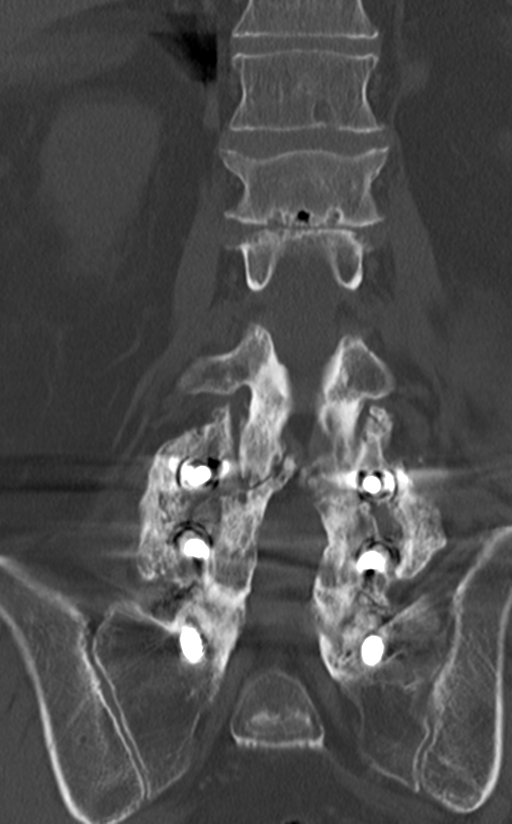

[Series 7: sag bone · sagittal · 0.30mm/px · 5 of 55 slices shown, 6 images]
[im 19/55  bone]
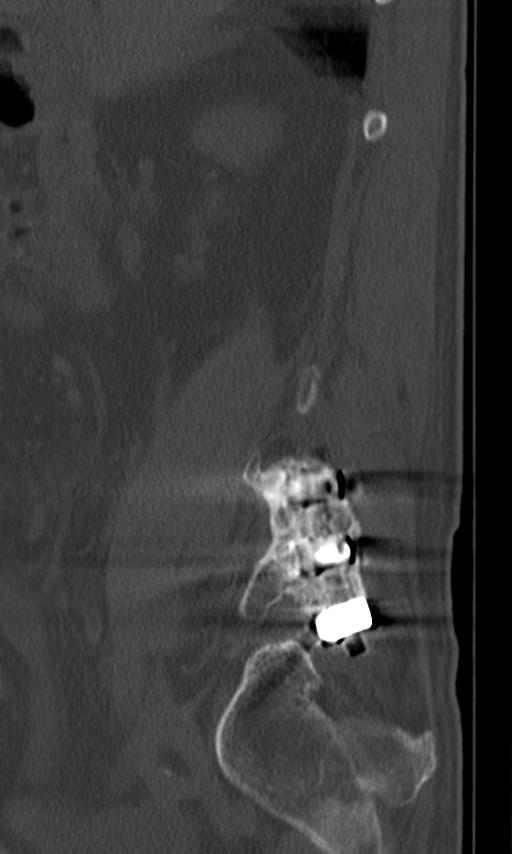
[im 23/55  bone]
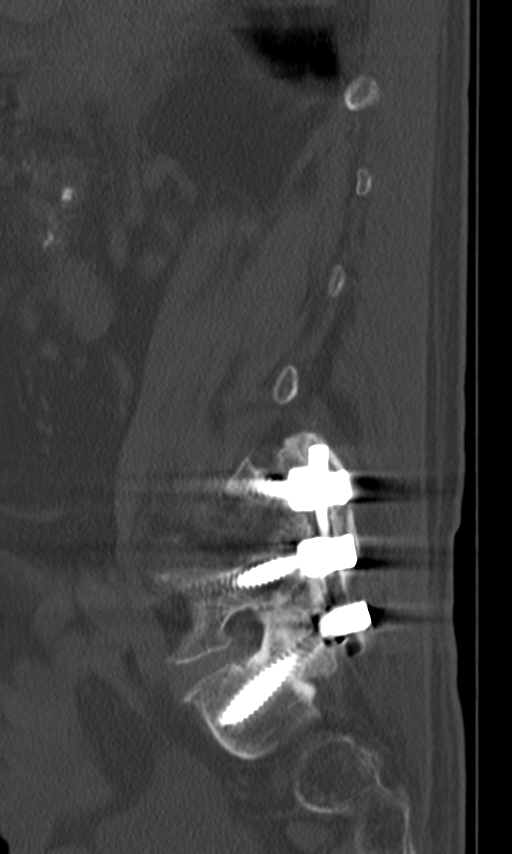
[im 28/55  soft-tissue]
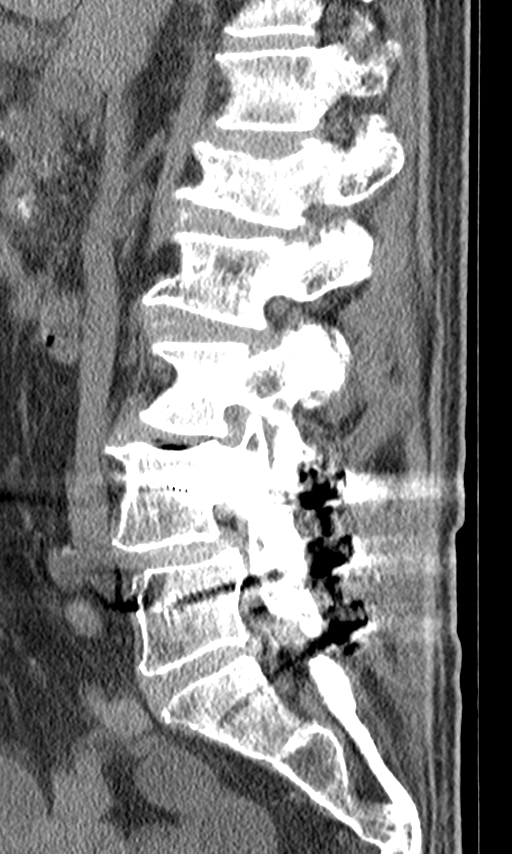
[im 28/55  bone]
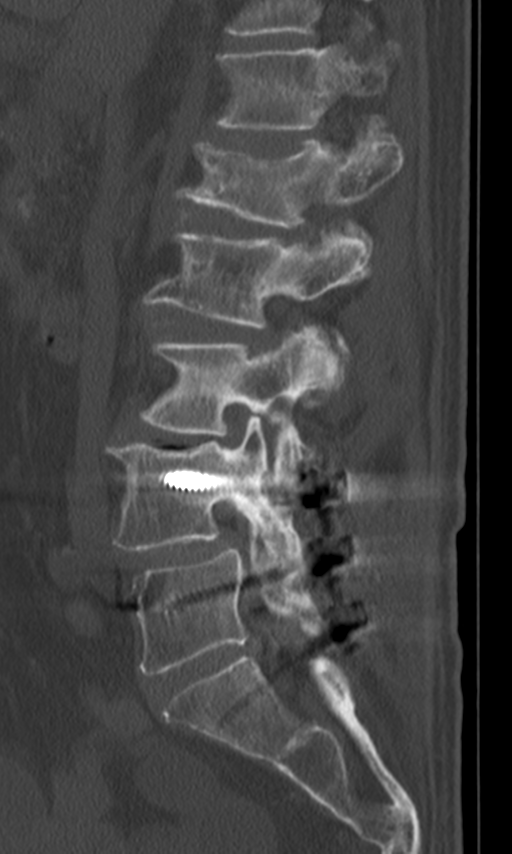
[im 32/55  bone]
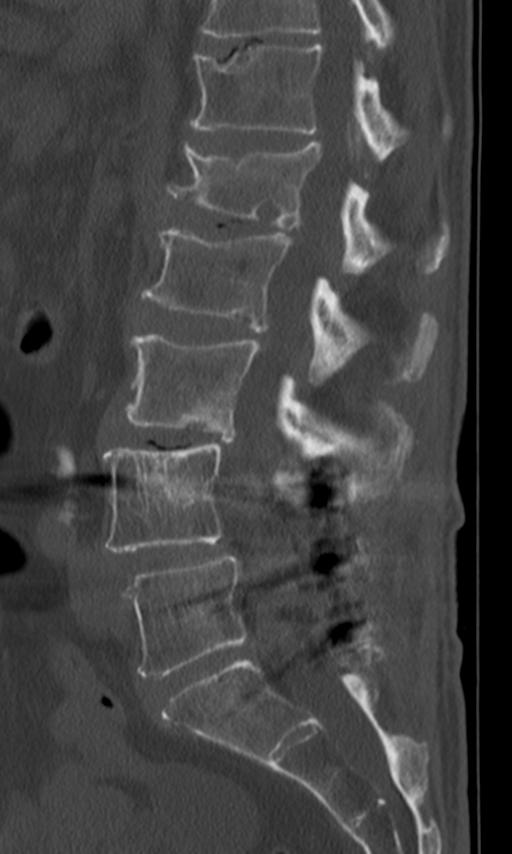
[im 37/55  bone]
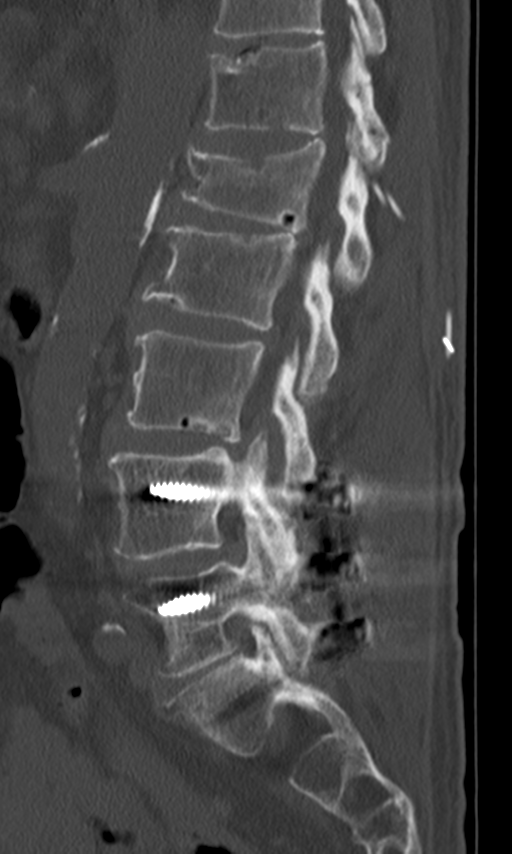

[11 of 33 positions shown; findings below may reference images not displayed]

FINDINGS: Segmentation: Standard

Alignment: Mild levoscoliosis from asymmetric height loss. Slight
retrolisthesis at L3-4. Fused anterolisthesis at L4-5.

Vertebrae: L4-5 and L5-S1 posterolateral fusion. Solid arthrodesis
at L4-5. Suspect solid arthrodesis at L5-S1 as well. There is mild
lucency around the S1 pedicle screws that is nonprogressive.

Remote L1 compression fracture. Remote L2 superior endplate
fracture. No evidence of acute fracture, discitis, or aggressive
bone lesion.

Paraspinal and other soft tissues: Dorsal column stimulator entering
the dorsal canal at the T12-L1 interspinous space.

Atherosclerotic calcifications. Partially visualized pancreatic
calcification and atrophy with main duct dilatation to 7 mm. These
changes were also seen on a outside 3758 lumbar MRI.

Disc levels:

T12- L1: Mild degenerative facet spurring. Disc narrowing and mild
endplate ridging. Patent canal and foramina.

L1-L2: Disc narrowing with vacuum phenomenon and disc fissure.
Chronic endplate degeneration. Mild disc bulging and ridging. Mild
facet hypertrophy. Right more than left moderate foraminal stenosis.
Mild spinal stenosis.

L2-L3: Disc narrowing and endplate ridging with circumferential disc
bulging. Mild spinal stenosis. Noncompressive bilateral foraminal
narrowing. Facet spurring is mild.

L3-L4: Advanced facet degeneration with retrolisthesis. The disc is
narrowed and bulging with progressive endplate degeneration.
Moderate to advanced spinal stenosis. Disc bulge causes advanced
right foraminal impingement and moderate left foraminal narrowing

L4-L5: Posterior-lateral fusion with solid arthrodesis.
Decompressive laminectomy. No evidence of residual impingement.

L5-S1:Posterior-lateral fusion hardware as above. No residual
impingement.
IMPRESSION: 1. Advanced degenerative disease with mild levoscoliosis and L3-4
retrolisthesis.
2. L3-4 progressive adjacent segment degeneration when compared to
myelogram in 3758. Disc bulge causes notably advanced right
foraminal impingement in this patient with right radiculopathy.
Moderate to advanced spinal stenosis. Moderate left foraminal
narrowing.
3. L2-3 mild spinal stenosis.
4. L1-2 moderate bilateral foraminal impingement.
5. L4-5 posterior-lateral solid arthrodesis.
6. Uncertain status of L5-S1 posterior-lateral arthrodesis, but no
hardware failure.

## 2020-09-11 ENCOUNTER — Emergency Department: Payer: Medicare Other

## 2020-09-11 ENCOUNTER — Observation Stay
Admission: EM | Admit: 2020-09-11 | Discharge: 2020-09-12 | Disposition: A | Discharge status: Home / Self Care | Payer: Medicare Other | Attending: Hospitalist | Admitting: Hospitalist

## 2020-09-11 DIAGNOSIS — K219 Gastro-esophageal reflux disease without esophagitis: Secondary | ICD-10-CM | POA: Insufficient documentation

## 2020-09-11 DIAGNOSIS — F1020 Alcohol dependence, uncomplicated: Secondary | ICD-10-CM | POA: Insufficient documentation

## 2020-09-11 DIAGNOSIS — F1190 Opioid use, unspecified, uncomplicated: Secondary | ICD-10-CM | POA: Insufficient documentation

## 2020-09-11 DIAGNOSIS — G8929 Other chronic pain: Secondary | ICD-10-CM | POA: Insufficient documentation

## 2020-09-11 DIAGNOSIS — I10 Essential (primary) hypertension: Secondary | ICD-10-CM | POA: Insufficient documentation

## 2020-09-11 DIAGNOSIS — E871 Hypo-osmolality and hyponatremia: Secondary | ICD-10-CM | POA: Insufficient documentation

## 2020-09-11 DIAGNOSIS — M797 Fibromyalgia: Secondary | ICD-10-CM | POA: Insufficient documentation

## 2020-09-11 DIAGNOSIS — W19XXXA Unspecified fall, initial encounter: Secondary | ICD-10-CM | POA: Insufficient documentation

## 2020-09-11 DIAGNOSIS — M4316 Spondylolisthesis, lumbar region: Secondary | ICD-10-CM | POA: Insufficient documentation

## 2020-09-11 DIAGNOSIS — Z981 Arthrodesis status: Secondary | ICD-10-CM | POA: Insufficient documentation

## 2020-09-11 DIAGNOSIS — N400 Enlarged prostate without lower urinary tract symptoms: Secondary | ICD-10-CM | POA: Insufficient documentation

## 2020-09-11 DIAGNOSIS — R079 Chest pain, unspecified: Secondary | ICD-10-CM

## 2020-09-11 DIAGNOSIS — E785 Hyperlipidemia, unspecified: Secondary | ICD-10-CM | POA: Insufficient documentation

## 2020-09-11 DIAGNOSIS — M48061 Spinal stenosis, lumbar region without neurogenic claudication: Secondary | ICD-10-CM | POA: Insufficient documentation

## 2020-09-11 DIAGNOSIS — E876 Hypokalemia: Secondary | ICD-10-CM | POA: Insufficient documentation

## 2020-09-11 DIAGNOSIS — S0083XA Contusion of other part of head, initial encounter: Secondary | ICD-10-CM | POA: Insufficient documentation

## 2020-09-11 DIAGNOSIS — M47816 Spondylosis without myelopathy or radiculopathy, lumbar region: Secondary | ICD-10-CM | POA: Insufficient documentation

## 2020-09-11 DIAGNOSIS — D649 Anemia, unspecified: Secondary | ICD-10-CM | POA: Insufficient documentation

## 2020-09-11 DIAGNOSIS — Z9114 Patient's other noncompliance with medication regimen: Secondary | ICD-10-CM | POA: Insufficient documentation

## 2020-09-11 DIAGNOSIS — I248 Other forms of acute ischemic heart disease: Secondary | ICD-10-CM | POA: Insufficient documentation

## 2020-09-11 DIAGNOSIS — R55 Syncope and collapse: Principal | ICD-10-CM | POA: Insufficient documentation

## 2020-09-11 DIAGNOSIS — Z86711 Personal history of pulmonary embolism: Secondary | ICD-10-CM | POA: Insufficient documentation

## 2020-09-11 DIAGNOSIS — R42 Dizziness and giddiness: Secondary | ICD-10-CM | POA: Insufficient documentation

## 2020-09-11 DIAGNOSIS — R7989 Other specified abnormal findings of blood chemistry: Secondary | ICD-10-CM | POA: Insufficient documentation

## 2020-09-11 LAB — CBC AND DIFFERENTIAL
Absolute NRBC: 0 10*3/uL (ref 0.00–0.00)
Basophils Absolute Automated: 0.01 10*3/uL (ref 0.00–0.08)
Basophils Automated: 0.1 %
Eosinophils Absolute Automated: 0.03 10*3/uL (ref 0.00–0.44)
Eosinophils Automated: 0.4 %
Hematocrit: 30.9 % — ABNORMAL LOW (ref 37.6–49.6)
Hgb: 10.5 g/dL — ABNORMAL LOW (ref 12.5–17.1)
Immature Granulocytes Absolute: 0.07 10*3/uL (ref 0.00–0.07)
Immature Granulocytes: 0.9 %
Lymphocytes Absolute Automated: 1.27 10*3/uL (ref 0.42–3.22)
Lymphocytes Automated: 17 %
MCH: 32.1 pg (ref 25.1–33.5)
MCHC: 34 g/dL (ref 31.5–35.8)
MCV: 94.5 fL (ref 78.0–96.0)
MPV: 9.3 fL (ref 8.9–12.5)
Monocytes Absolute Automated: 0.46 10*3/uL (ref 0.21–0.85)
Monocytes: 6.2 %
Neutrophils Absolute: 5.61 10*3/uL (ref 1.10–6.33)
Neutrophils: 75.4 %
Nucleated RBC: 0 /100 WBC (ref 0.0–0.0)
Platelets: 221 10*3/uL (ref 142–346)
RBC: 3.27 10*6/uL — ABNORMAL LOW (ref 4.20–5.90)
RDW: 14 % (ref 11–15)
WBC: 7.45 10*3/uL (ref 3.10–9.50)

## 2020-09-11 LAB — COMPREHENSIVE METABOLIC PANEL
ALT: 13 U/L (ref 0–55)
AST (SGOT): 7 U/L (ref 5–34)
Albumin/Globulin Ratio: 1.3 (ref 0.9–2.2)
Albumin: 2.6 g/dL — ABNORMAL LOW (ref 3.5–5.0)
Alkaline Phosphatase: 77 U/L (ref 38–106)
Anion Gap: 8 (ref 5.0–15.0)
BUN: 16 mg/dL (ref 9.0–28.0)
Bilirubin, Total: 0.4 mg/dL (ref 0.2–1.2)
CO2: 18 mEq/L — ABNORMAL LOW (ref 22–29)
Calcium: 7.9 mg/dL (ref 7.9–10.2)
Chloride: 109 mEq/L (ref 100–111)
Creatinine: 1.1 mg/dL (ref 0.7–1.3)
Globulin: 2 g/dL (ref 2.0–3.6)
Glucose: 190 mg/dL — ABNORMAL HIGH (ref 70–100)
Potassium: 3.2 mEq/L — ABNORMAL LOW (ref 3.5–5.1)
Protein, Total: 4.6 g/dL — ABNORMAL LOW (ref 6.0–8.3)
Sodium: 135 mEq/L — ABNORMAL LOW (ref 136–145)

## 2020-09-11 LAB — ETHANOL: Alcohol: NOT DETECTED mg/dL

## 2020-09-11 LAB — TROPONIN I
Troponin I: 0.07 ng/mL — ABNORMAL HIGH (ref 0.00–0.05)
Troponin I: 0.04 ng/mL (ref 0.00–0.05)

## 2020-09-11 LAB — GFR: EGFR: >60

## 2020-09-11 MED ORDER — THIAMINE (VITAMIN B1) 100 MG PO TABS (WRAP)
100.0000 mg | ORAL_TABLET | Freq: Every day | ORAL | Status: DC
Start: 2020-09-12 — End: 2020-09-12
  Administered 2020-09-12: 08:00:00 100 mg via ORAL
  Filled 2020-09-11: qty 1

## 2020-09-11 MED ORDER — ACETAMINOPHEN 650 MG RE SUPP
650.0000 mg | Freq: Four times a day (QID) | RECTAL | Status: DC | PRN
Start: 2020-09-11 — End: 2020-09-12

## 2020-09-11 MED ORDER — METHADONE HCL 5 MG PO TABS
5.0000 mg | ORAL_TABLET | Freq: Three times a day (TID) | ORAL | Status: DC
Start: 2020-09-12 — End: 2020-09-12
  Administered 2020-09-12 (×2): 5 mg via ORAL
  Filled 2020-09-11 (×2): qty 1

## 2020-09-11 MED ORDER — DEXTROSE 50 % IV SOLN
12.5000 g | INTRAVENOUS | Status: DC | PRN
Start: 2020-09-11 — End: 2020-09-12

## 2020-09-11 MED ORDER — ONDANSETRON 4 MG PO TBDP
4.0000 mg | ORAL_TABLET | Freq: Four times a day (QID) | ORAL | Status: DC | PRN
Start: 2020-09-11 — End: 2020-09-12

## 2020-09-11 MED ORDER — ACETAMINOPHEN 325 MG PO TABS
650.0000 mg | ORAL_TABLET | Freq: Four times a day (QID) | ORAL | Status: DC | PRN
Start: 2020-09-11 — End: 2020-09-12
  Administered 2020-09-12: 650 mg via ORAL
  Filled 2020-09-11: qty 2

## 2020-09-11 MED ORDER — GLUCOSE 40 % PO GEL
15.0000 g | ORAL | Status: DC | PRN
Start: 2020-09-11 — End: 2020-09-12

## 2020-09-11 MED ORDER — ONDANSETRON HCL 4 MG/2ML IJ SOLN
4.0000 mg | Freq: Four times a day (QID) | INTRAMUSCULAR | Status: DC | PRN
Start: 2020-09-11 — End: 2020-09-12

## 2020-09-11 MED ORDER — LISINOPRIL 10 MG PO TABS
ORAL_TABLET | Freq: Every day | ORAL | Status: DC
Start: 2020-09-12 — End: 2020-09-12

## 2020-09-11 MED ORDER — GLUCAGON 1 MG IJ SOLR (WRAP)
1.0000 mg | INTRAMUSCULAR | Status: DC | PRN
Start: 2020-09-11 — End: 2020-09-12

## 2020-09-11 MED ORDER — MORPHINE SULFATE 4 MG/ML IJ/IV SOLN (WRAP)
4.0000 mg | Freq: Once | Status: AC
Start: 2020-09-11 — End: 2020-09-11
  Administered 2020-09-11: 20:00:00 4 mg via INTRAVENOUS
  Filled 2020-09-11: qty 1

## 2020-09-11 MED ORDER — NALOXONE HCL 0.4 MG/ML IJ SOLN (WRAP)
0.2000 mg | INTRAMUSCULAR | Status: DC | PRN
Start: 2020-09-11 — End: 2020-09-12

## 2020-09-11 MED ORDER — MELATONIN 3 MG PO TABS
6.0000 mg | ORAL_TABLET | Freq: Every evening | ORAL | Status: DC | PRN
Start: 2020-09-11 — End: 2020-09-12

## 2020-09-11 MED ORDER — FOLIC ACID 1 MG PO TABS
1.0000 mg | ORAL_TABLET | Freq: Every day | ORAL | Status: DC
Start: 2020-09-12 — End: 2020-09-12
  Administered 2020-09-12: 08:00:00 1 mg via ORAL
  Filled 2020-09-11: qty 1

## 2020-09-11 MED ORDER — HEPARIN SODIUM (PORCINE) 5000 UNIT/ML IJ SOLN
5000.0000 [IU] | Freq: Two times a day (BID) | INTRAMUSCULAR | Status: DC
Start: 2020-09-11 — End: 2020-09-12
  Administered 2020-09-12 (×2): 5000 [IU] via SUBCUTANEOUS
  Filled 2020-09-11 (×2): qty 1

## 2020-09-11 MED ORDER — GABAPENTIN 300 MG PO CAPS
300.0000 mg | ORAL_CAPSULE | Freq: Three times a day (TID) | ORAL | Status: DC
Start: 2020-09-12 — End: 2020-09-12
  Administered 2020-09-12 (×2): 300 mg via ORAL
  Filled 2020-09-11 (×2): qty 1

## 2020-09-11 MED ORDER — MORPHINE SULFATE 4 MG/ML IJ/IV SOLN (WRAP)
4.0000 mg | Freq: Once | Status: AC
Start: 2020-09-11 — End: 2020-09-11
  Administered 2020-09-11: 23:00:00 4 mg via INTRAVENOUS
  Filled 2020-09-11: qty 1

## 2020-09-11 MED ORDER — ATORVASTATIN CALCIUM 10 MG PO TABS
10.0000 mg | ORAL_TABLET | Freq: Every day | ORAL | Status: DC
Start: 2020-09-12 — End: 2020-09-12
  Administered 2020-09-12: 08:00:00 10 mg via ORAL
  Filled 2020-09-11: qty 1

## 2020-09-11 MED ORDER — TRAZODONE HCL 50 MG PO TABS
50.0000 mg | ORAL_TABLET | Freq: Every evening | ORAL | Status: DC
Start: 2020-09-12 — End: 2020-09-12

## 2020-09-11 MED ORDER — LIDOCAINE 5 % EX PTCH
1.0000 | MEDICATED_PATCH | CUTANEOUS | Status: DC
Start: 2020-09-12 — End: 2020-09-12
  Administered 2020-09-12: 01:00:00 1 via TRANSDERMAL
  Filled 2020-09-11: qty 1

## 2020-09-11 NOTE — ED Notes (Signed)
Bed: S C3  Expected date:   Expected time:   Means of arrival:   Comments:  RTE from TB 3 ?

## 2020-09-11 NOTE — ED Notes (Signed)
Bed: S 8  Expected date:   Expected time:   Means of arrival:   Comments:  C3 here

## 2020-09-11 NOTE — ED Provider Notes (Signed)
[name] EMERGENCY DEPARTMENT H&P                                             ATTENDING SUPERVISORY NOTE       ATTENDING NOTE      The patient was seen and examined by the resident or APP, and the plan of care was discussed with me. I agree with the plan as it was presented to me.  I have reviewed and agree with the final ED diagnosis.    I spoke to and examined the patient as well: Yes  I was present during key portions of any procedures performed: N/A  Interpreter used N/A  Nursing notes reviewed Yes    Matthew [name] is a 79 y.o. male w/ PMHx HLD, HTN here with moderate persistent sharp, non-radiating, pleuritic L sided rib pain s/p fall earlier this evening. Pt got dizzy and fell. Endorses LOC lasting for a couple seconds. He reports feeling fine before the incident.     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     Positive and negative ROS elements as per HPI.  All other systems reviewed and negative.    Selected physical exam findings: Hematoma over left eyebrow.  C-collar in place with no C-spine tenderness.  Tenderness over the left ribs in the anterior wall axillary line.  MDM: Patient with no evidence of acute trauma however does have syncope of unknown etiology.  Also with history of alcohol withdrawal.  We will ask medicine to admit as knobs patient.    O2 sat-                   saturation: 100 %; Oxygen use: room air; Interpretation: Normal    Radiology -             interpreted by me with the following observations: CXR - no acute infiltrate  EKG -                     interpreted by me: normal sinus at 65 w/ PACs. Low voltage. L axis deviation. Artifact at baseline but no obvious ischemic changes.   Monitor -                 interpreted by me: normal sinus at 73.         ED Course as of Sep 15 1856   Wynelle Link Sep 11, 2020   2245 Tried to call to update daughter on patient's admission, no answer    [JD]      ED Course User  Index  [JD] Corky Downs, DO             VISIT INFORMATION    Medications Given in the ED:    .     ED Medication Orders (From admission, onward)    Start Ordered     Status Ordering Provider    09/11/20 1956 09/11/20 1955  morphine injection 4 mg  Once     Route: Intravenous  Ordered Dose: 4 mg     Last MAR action: Given DANYLUK, JILL E            Procedures:      Procedures            PAST HISTORY  Primary Care Provider: Pcp, None, MD        PMH/PSH:    .     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      Social/Family History:      He reports that he has never smoked. He has never used smokeless tobacco. He reports current alcohol use of about 3.0 standard drinks of alcohol per week. He reports that he does not use drugs.    History reviewed. No pertinent family history.      Listed Medications on Arrival:    .     Previous Medications    AMLODIPINE-BENAZEPRIL (LOTREL) 10-20 MG PER CAPSULE    Take 1 capsule of your 5mg -10mg  dose  capsule. This was prescribed by your primacy care provider.    CYCLOBENZAPRINE (FLEXERIL) 5 MG TABLET    Take 1 tablet (5 mg total) by mouth 3 (three) times daily as needed for Muscle spasms    DOCUSATE SODIUM (COLACE) 100 MG CAPSULE    Take 1 capsule (100 mg total) by mouth 2 (two) times daily as needed for Constipation    DULOXETINE (CYMBALTA) 20 MG CAPSULE    Take 20 mg by mouth daily    FOLIC ACID (FOLVITE) 1 MG TABLET    Take 1 tablet (1 mg total) by mouth daily    HYDROMORPHONE (DILAUDID) 2 MG TABLET    Take 1 tablet (2 mg total) by mouth every 4 (four) hours as needed for Pain    LOPERAMIDE HCL (IMODIUM PO)    Take by mouth    MAGNESIUM OXIDE (MAG-OX) 400 MG TABLET    Take 1 tablet (400 mg total) by mouth 2 (two) times daily    MORPHINE SULFATE ER 15 MG TABLET EXTENDED RELEASE ABUSE-DETERRENT    Take 15 mg by mouth daily    MULTIVITAMIN (MULTIVITAMIN) TAB    Take 1 tablet by mouth daily    NAPROXEN SODIUM (ANAPROX) 220 MG TABLET    Take 220 mg by mouth 2 (two) times daily with meals    PRAMIPEXOLE (MIRAPEX) 0.5 MG TABLET    Take 0.5 mg by mouth nightly    THIAMINE (B-1) 100 MG TABLET    Take 1 tablet (100 mg total) by mouth daily    TRAZODONE (DESYREL) 50 MG TABLET    Take 50 mg by mouth nightly  Allergies: He has No Known Allergies.            RESULTS        Lab Results:      Results     Procedure Component Value Units Date/Time    GFR [[id number]] Collected: 09/11/20 2007     Updated: 09/11/20 2037     EGFR >60.0    Comprehensive metabolic panel [[id number]]  (Abnormal) Collected: 09/11/20 2007    Specimen: Blood Updated: 09/11/20 2037     Glucose 190 mg/dL      BUN 78.2 mg/dL      Creatinine 1.1 mg/dL      Sodium 956 mEq/L      Potassium 3.2 mEq/L      Chloride 109 mEq/L      CO2 18 mEq/L      Calcium 7.9 mg/dL      Protein, Total 4.6 g/dL      Albumin 2.6 g/dL      AST (SGOT) 7 U/L      ALT 13 U/L      Alkaline Phosphatase 77 U/L      Bilirubin, Total 0.4 mg/dL      Globulin 2.0 g/dL       Albumin/Globulin Ratio 1.3     Anion Gap 8.0    Troponin I [[id number]]  (Abnormal) Collected: 09/11/20 2007    Specimen: Blood Updated: 09/11/20 2037     Troponin I 0.07 ng/mL     CBC and differential [[id number]]  (Abnormal) Collected: 09/11/20 2007    Specimen: Blood Updated: 09/11/20 2025     WBC 7.45 x10 3/uL      Hgb 10.5 g/dL      Hematocrit 41.3 %      Platelets 221 x10 3/uL      RBC 3.27 x10 6/uL      MCV 94.5 fL      MCH 32.1 pg      MCHC 34.0 g/dL      RDW 14 %      MPV 9.3 fL      Neutrophils 75.4 %      Lymphocytes Automated 17.0 %      Monocytes 6.2 %      Eosinophils Automated 0.4 %      Basophils Automated 0.1 %      Immature Granulocytes 0.9 %      Nucleated RBC 0.0 /100 WBC      Neutrophils Absolute 5.61 x10 3/uL      Lymphocytes Absolute Automated 1.27 x10 3/uL      Monocytes Absolute Automated 0.46 x10 3/uL      Eosinophils Absolute Automated 0.03 x10 3/uL      Basophils Absolute Automated 0.01 x10 3/uL      Immature Granulocytes Absolute 0.07 x10 3/uL      Absolute NRBC 0.00 x10 3/uL               Radiology Results:      Ribs Right with PA Chest   Final Result      1.  No evidence of displaced rib fracture.      2.  Postsurgical and degenerative changes of the spine, incompletely   evaluated on this exam.      Carla Drape, MD    09/11/2020 9:51 PM      XR Ribs Left 2 Views   Final Result      1.  No evidence of displaced rib fracture.      2.  Postsurgical and degenerative changes of the spine, incompletely   evaluated on this exam.      Carla Drape, MD    09/11/2020 9:51 PM      CT Head WO Contrast    (Results Pending)   XR Chest 2 Views    (Results Pending)   Lumbar Spine AP and Lateral    (Results Pending)               Scribe Attestation:      I was acting as a scribe for Juliane Poot, MD on Briel,Tywaun DAVID  Mahlon Gammon    I am the first provider for this patient and I personally performed the services documented. Mahlon Gammon is scribing for me on [employer]. This note  accurately reflects work and decisions made by me.  Juliane Poot, MD      *This note was generated by the Epic EMR system/ Dragon speech recognition and may contain inherent errors or omissions not intended by the user. Grammatical errors, random word insertions, deletions, pronoun errors and incomplete sentences are occasional consequences of this technology due to software limitations. Not all errors are caught or corrected. If there are questions or concerns about the content of this note or information contained within the body of this dictation they should be addressed directly with the author for clarification.Juliane Poot, MD  09/15/20 1900

## 2020-09-11 NOTE — ED Notes (Signed)
Bed: T 3A  Expected date:   Expected time:   Means of arrival:   Comments:  Jessica C to settle and see if they're C3-able

## 2020-09-11 NOTE — ED Notes (Signed)
[facility] H[facility] EMERGENCY DEPT  ED NURSING NOTE FOR THE RECEIVING INPATIENT NURSE   ED NURSE Jonni Sanger [id number]   ED CHARGE RN Toma Copier   ADMISSION INFORMATION   Council Munguia is a 79 y.o. male admitted with an ED diagnosis of:    1. Syncope and collapse         Isolation: None   Allergies: Patient has no known allergies.   Holding Orders confirmed? N/A   Belongings Documented? Yes   Home medications sent to pharmacy confirmed? N/A   NURSING CARE   Patient Comes From:   Mental Status: Home Independent  alert and oriented   ADL: Needs assistance with ADLs   Ambulation: mild difficulty   Pertinent Information  and Safety Concerns: Near-syncopal episode leading to ground-level fall. AAOx4. CC in place; awaiting scan results. Takes Methadone. First trop 0.07; will redraw @ 2300.     CT / NIH   CT Head ordered on this patient?  Yes   NIH/Dysphagia assessment done prior to admission? N/A   VITAL SIGNS (at the time of this note)      Vitals:    09/11/20 2230   BP: 158/86   Pulse: 75   Resp: 17   Temp: 98.8 F (37.1 C)   SpO2: 99%

## 2020-09-11 NOTE — H&P (Signed)
ADMISSION HISTORY AND PHYSICAL EXAM    Date Time: 09/11/20 10:48 PM  Patient Name: Matthew [name]  Attending Physician: Britta Mccreedy, *  Primary Care Physician: Pcp, None, MD    CC: Syncope    Assessment:   79 y.o. male with BPH, HTN, HLD, fibromyalgia in remission, history of bilateral PE, Degenerative lumbar stenosis, syncopal episodes BIBA for  Syncopal episode with brief LOC. EKG with sinus rhythm, troponin mildly elevated, HCT negative, Xray of ribs, lumbar spine with no acute findings,. Xray of cervical spine pending. All other workup remain non-remarkable. Admitting to medicine obs. for further management    Plan:   #Syncopal episode--unknown etiology r/o cardiac vs neurological cause  -EKG with sinus rhythm   -HCT negative for any acute findings  -Xray, ribs, lumbar spine shows no acute findings of fracture. Known post surgical and degenerative changes of the spine noted  -F/up on cervical spine xray results.   -Troponin 0.07; continue to trend till peak  -Neuro checks q4h x1day  -orthostatic BP qshift  -Fall precautions  -Lidocaine patch to be applied to left rib for pain       #HTN   -On Amlodipine-benazepril 1 capsule (5-10mg  dose) daily   -Alternative  Lisinopril-amlodipine 10-2.5 dose (1tab daily dose) ordered           #HLD  -Diet controlled       #H/O Degenerative lumbar stenosis, spinal stenosis, spondylolisthesis, fibromyalgia in remission  -on methadone 5mg  q8h continue       #Alcoholism   -c/w thiamine 100mg  and folic acid 1mg  daily   -Ciwa precautions in place.         Code Status: full code   Discussed with: Attending, patient, RN.    Disposition: (Please see PAF column for Expected D/C Date)   Today's date: 09/11/2020   Admit Date: 09/11/2020  7:06 PM  Service status: Observation  Anticipated discharge needs: TBD    History of Presenting Illness:   Matthew [name] is a 79 y.o. male with BPH, HTN, HLD, fibromyalgia in remission, history of bilateral PE, Degenerative lumbar  stenosis, syncopal episodes BIBA for  Syncopal episode with brief LOC. PT reports getting out of hsi car and walking to this house when he experienced period of dizziness and fell on his front door. He reports brief period ~10sec of LOC but he was able to quickly be oriented to his surroundings. He endorses left rib pain from hitting the floor on that side and also some bruise above his left eyebrow. He denies any other associated symptoms at that time.     Currently denies any chest pain, dizziness, lightheadedness, N/V, SOB, DOE. Fever chills, palpitations. Last drink was last night when he has glass of mixed drink.   Of note pt daughter reports pt has history of OD on methadone and opioids.   Takes 3 drinks per week/glasses of wine     In ED  EKG with sinus rhythm,  troponin elevated at 0.07, UA pending. HCT negative for any acute findings, Xray, ribs, lumbar spine shows no acute findings of fracture. Known post surgical and degenerative changes of the spine noted. X-ray cervical spine pending.   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    Past  Surgical History:     Past Surgical History:   Procedure Laterality Date    APPENDECTOMY  1952    CERVICAL FUSION  1997    states has full ROM of neck    CHOLECYSTECTOMY  1980    ERCP, STENT  2007    LAMINECTOMY, POSTERIOR LUMBAR, DECOMP, FUSION, LEVEL 2 N/A 11/10/2015    Procedure: LAMINECTOMY, POSTERIOR LUMBAR, DECOMP, FUSION, LEVEL 2;  Surgeon: Clelia Schaumann, MD;  Location: [facility];  Service: Orthopedics;  Laterality: N/A;  L4-S1 LAMINECTOMY/FUSION    REMOVAL, POSTERIOR LUMBAR SPINE HARDWARE N/A 05/20/2018    Procedure: REMOVAL POSTERIOR LUMBAR SPINE HARDWARE L4-S1;  Surgeon: Clelia Schaumann, MD;  Location: [facility];  Service: Orthopedics;  Laterality: N/A;  REMOVAL OF HARDWARE LUMBAR SPINE    SUPRAPUBIC PROSTATECTOMY  2007    TONSILLECTOMY  as child    VASECTOMY  1980's       Family History:   History reviewed. No pertinent family history.    Social History:     Social History     Tobacco Use   Smoking Status Never Smoker   Smokeless Tobacco Never Used     Social History     Substance and Sexual Activity   Alcohol Use Yes    Alcohol/week: 3.0 standard drinks    Types: 3 Glasses of wine per week    Comment: socially     Social History     Substance and Sexual Activity   Drug Use No       Allergies:   No Known Allergies    Medications:     Home Medications     Med List Status: Complete Set By: Hyacinth Meeker, DNP FNP at 09/11/2020 11:54 PM                amlodipine-benazepril (LOTREL) 10-20 MG per capsule     Take 1 capsule of your 5mg -10mg  dose capsule. This was prescribed by your primacy care provider.     atorvastatin (LIPITOR) 10 MG tablet     Take 10 mg by mouth daily     cyclobenzaprine (FLEXERIL) 5 MG tablet     Take 1 tablet (5 mg total) by mouth 3 (three) times daily as needed for Muscle spasms     docusate sodium (COLACE) 100 MG capsule     Take 1 capsule (100 mg total) by mouth 2 (two) times daily as needed for Constipation     DULoxetine (CYMBALTA) 20 MG capsule     Take 20  mg by mouth daily     folic acid (FOLVITE) 1 MG tablet  Take 1 tablet (1 mg total) by mouth daily     gabapentin (NEURONTIN) 300 MG capsule     Take 300 mg by mouth 3 (three) times daily     HYDROmorphone (DILAUDID) 2 MG tablet     Take 1 tablet (2 mg total) by mouth every 4 (four) hours as needed for Pain     Loperamide HCl (IMODIUM PO)     Take by mouth     magnesium oxide (MAG-OX) 400 MG tablet     Take 1 tablet (400 mg total) by mouth 2 (two) times daily     methadone (DOLOPHINE) 5 MG tablet     Take 5 mg by mouth every 8 (eight) hours     Morphine Sulfate ER 15 MG Tablet Extended Release Abuse-Deterrent     Take 15 mg by mouth daily     multivitamin (MULTIVITAMIN) Tab     Take 1 tablet by mouth daily     naproxen sodium (ANAPROX) 220 MG tablet     Take 220 mg by mouth 2 (two) times daily with meals     pramipexole (MIRAPEX) 0.5 MG tablet     Take 0.5 mg by mouth nightly     thiamine (B-1) 100 MG tablet     Take 1 tablet (100 mg total) by mouth daily     traZODone (DESYREL) 50 MG tablet     Take 50 mg by mouth nightly            Method by which medications were confirmed on admission: face-to-face with patient. And daughter   Review of Systems:   All other systems were reviewed and are negative except: as per HPI    Physical Exam:     Patient Vitals for the past 24 hrs:   BP Temp Temp src Pulse Resp SpO2 Height Weight   09/11/20 2230 158/86 98.8 F (37.1 C) Oral 75 17 99 %     09/11/20 1906 150/68 97.4 F (36.3 C) Oral 73 18 100 % 1.702 m (5\' 7" ) 59.9 kg (132 lb)     Body mass index is 20.67 kg/m.  No intake or output data in the 24 hours ending 09/11/20 2248    General: awake, alert, oriented x 3; no acute distress.  HEENT: perrla, eomi, sclera anicteric  oropharynx clear without lesions, mucous membranes moist  Neck: supple, no lymphadenopathy, no thyromegaly, no JVD, no carotid bruits  Cardiovascular: regular rate and rhythm, no murmurs, rubs or gallops  Lungs: clear to auscultation bilaterally,  without wheezing, rhonchi, or rales  Abdomen: soft, non-tender, non-distended; no palpable masses, no hepatosplenomegaly, normoactive bowel sounds, no rebound or guarding  Extremities: no clubbing, cyanosis, or edema  Neuro: cranial nerves grossly intact, strength 5/5 in upper and lower extremities, sensation intact,   Skin: bruise on left eyebrow    Labs:     Results     Procedure Component Value Units Date/Time    Ethanol (Alcohol) Level [[id number]] Collected: 09/11/20 2228    Specimen: Blood Updated: 09/11/20 2302     Alcohol None Detected mg/dL     Troponin I [[id number]] Collected: 09/11/20 2256    Specimen: Blood Updated: 09/11/20 2256    GFR [[id number]] Collected: 09/11/20 2007     Updated: 09/11/20 2037     EGFR >60.0    Comprehensive metabolic panel [[id number]]  (Abnormal) Collected: 09/11/20 2007    Specimen: Blood Updated: 09/11/20 2037     Glucose 190 mg/dL  BUN 16.0 mg/dL      Creatinine 1.1 mg/dL      Sodium 191 mEq/L      Potassium 3.2 mEq/L      Chloride 109 mEq/L      CO2 18 mEq/L      Calcium 7.9 mg/dL      Protein, Total 4.6 g/dL      Albumin 2.6 g/dL      AST (SGOT) 7 U/L      ALT 13 U/L      Alkaline Phosphatase 77 U/L      Bilirubin, Total 0.4 mg/dL      Globulin 2.0 g/dL      Albumin/Globulin Ratio 1.3     Anion Gap 8.0    Troponin I [[id number]]  (Abnormal) Collected: 09/11/20 2007    Specimen: Blood Updated: 09/11/20 2037     Troponin I 0.07 ng/mL     CBC and differential [[id number]]  (Abnormal) Collected: 09/11/20 2007    Specimen: Blood Updated: 09/11/20 2025     WBC 7.45 x10 3/uL      Hgb 10.5 g/dL      Hematocrit 96.2 %      Platelets 221 x10 3/uL      RBC 3.27 x10 6/uL      MCV 94.5 fL      MCH 32.1 pg      MCHC 34.0 g/dL      RDW 14 %      MPV 9.3 fL      Neutrophils 75.4 %      Lymphocytes Automated 17.0 %      Monocytes 6.2 %      Eosinophils Automated 0.4 %      Basophils Automated 0.1 %      Immature Granulocytes 0.9 %      Nucleated RBC 0.0 /100 WBC      Neutrophils Absolute  5.61 x10 3/uL      Lymphocytes Absolute Automated 1.27 x10 3/uL      Monocytes Absolute Automated 0.46 x10 3/uL      Eosinophils Absolute Automated 0.03 x10 3/uL      Basophils Absolute Automated 0.01 x10 3/uL      Immature Granulocytes Absolute 0.07 x10 3/uL      Absolute NRBC 0.00 x10 3/uL           Imaging personally reviewed, including:   XR Ribs Left 2 Views    Result Date: 09/11/2020  1.  No evidence of displaced rib fracture. 2.  Postsurgical and degenerative changes of the spine, incompletely evaluated on this exam. Carla Drape, MD  09/11/2020 9:51 PM    Ribs Right with PA Chest    Result Date: 09/11/2020  1.  No evidence of displaced rib fracture. 2.  Postsurgical and degenerative changes of the spine, incompletely evaluated on this exam. Carla Drape, MD  09/11/2020 9:51 PM    Lumbar Spine AP and Lateral    Result Date: 09/11/2020  1.  Postsurgical and degenerative changes of the lumbar spine, as described above. 2.  Multiple superior endplate compression deformities at the thoracolumbar junction, most pronounced at L1, which are of uncertain chronicity. Carla Drape, MD  09/11/2020 10:36 PM    CT Head WO Contrast    Result Date: 09/11/2020   No acute traumatic intracranial abnormality is seen. Leandro Reasoner, MD  09/11/2020 10:09 PM    Safety Checklist  DVT prophylaxis:  CHEST guideline (See page [id number]) Chemical and Mechanical   Foley:  [place] Rn Foley protocol Not present   IVs:  Peripheral IV   PT/OT: Not needed   Daily CBC & or Chem ordered:  SHM/ABIM guidelines (see #5) Yes, due to clinical and lab instability       Signed by: Hyacinth Meeker, FNP  cc:Pcp, None, MD

## 2020-09-11 NOTE — ED Provider Notes (Signed)
[place] [facility] EMERGENCY DEPARTMENT RESIDENT H&P       CLINICAL INFORMATION        HPI:      Chief Complaint: Fall  .    Matthew [name] is a 79 y.o. male who presents after a fall this evening. He states he was walking from the car to his house and got suddenly got dizzy and fell on his left side. He states he lost consciousness for only a couple of seconds. He was feeling fine earlier in the day and before the incident. He currently endorses left sided rib pain 8/10, sharp in nature, non radiating. Pain worse with breathing.     Spoke to daughter on the phone who is away on vacation in [place]. Patient lives with daughter but since she is on vacation her mother is the one who witnessed the fall. 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     History obtained from: patient, daughter    Nursing (triage) note reviewed for the following pertinent information:  Brought by EMS as RTE. Near-syncopal episode leading to ground-level fall; struck L side of head on concrete. Brief LOC. +CC. Hematoma to L forehead; no active bleeding. AAox4. ALso c/o L-sided rib pain. Denies thinners.      ROS:      Review of Systems   Constitutional: Negative for activity change, appetite change, chills, diaphoresis, fatigue and fever.   HENT: Negative for congestion.    Eyes: Negative for pain, discharge, redness and itching.   Respiratory: Negative for apnea, cough, choking, chest tightness, shortness of breath, wheezing and stridor.    Cardiovascular: Negative for chest pain, palpitations and leg swelling.   Gastrointestinal: Negative for abdominal distention, abdominal pain, constipation, diarrhea, nausea and vomiting.   Genitourinary: Negative for difficulty urinating, dysuria, frequency and urgency.   Musculoskeletal: Positive for back pain. Negative for arthralgias, joint swelling and neck pain.   Skin: Positive for wound  (bruise to left forehead). Negative for color change, pallor and rash.   Neurological: Positive for dizziness, syncope and light-headedness. Negative for weakness, numbness and headaches.   Psychiatric/Behavioral: Negative for agitation, behavioral problems and confusion.         Physical Exam:      Pulse 73   BP 150/68   Resp 18   SpO2 100 %   Temp 97.4 F (36.3 C)    Physical Exam  Vitals and nursing note reviewed.   Constitutional:       General: He is not in acute distress.     Appearance: Normal appearance. He is normal weight. He is not ill-appearing, toxic-appearing or diaphoretic.   HENT:      Head: Normocephalic.      Comments: Hematoma over left eyebrow     Nose: Nose normal.      Mouth/Throat:      Mouth: Mucous membranes are moist.   Eyes:      General: No scleral icterus.        Right eye: No discharge.         Left eye: No discharge.      Extraocular Movements: Extraocular movements intact.      Conjunctiva/sclera: Conjunctivae normal.   Neck:      Comments: C spine collar in place  Cardiovascular:      Rate and Rhythm: Normal rate and regular rhythm.      Pulses: Normal pulses.  Heart sounds: Normal heart sounds.   Pulmonary:      Effort: Pulmonary effort is normal. No respiratory distress.      Breath sounds: Normal breath sounds. No stridor. No wheezing, rhonchi or rales.   Abdominal:      General: Abdomen is flat. Bowel sounds are normal. There is no distension.      Palpations: Abdomen is soft.      Tenderness: There is no abdominal tenderness. There is no guarding or rebound.   Musculoskeletal:         General: No swelling.      Cervical back: Neck supple.      Right lower leg: No edema.      Left lower leg: No edema.      Comments: Tender to palpation over left ribs   Skin:     General: Skin is warm.      Capillary Refill: Capillary refill takes less than 2 seconds.      Coloration: Skin is not jaundiced.      Findings: Bruising present. No lesion.   Neurological:      General: No focal  deficit present.      Mental Status: He is alert and oriented to person, place, and time. Mental status is at baseline.      Cranial Nerves: No cranial nerve deficit.      Sensory: No sensory deficit.      Motor: No weakness.   Psychiatric:         Mood and Affect: Mood normal.         Behavior: Behavior normal.         Thought Content: Thought content normal.         Judgment: Judgment normal.                 PAST HISTORY        Primary Care Provider: Pcp, None, MD        PMH/PSH:    .     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      Social/Family History:      He reports that he has never smoked. He has never used smokeless tobacco. He reports current alcohol use of about  3.0 standard drinks of alcohol per week. He reports that he does not use drugs.    History reviewed. No pertinent family history.      Listed  Medications on Arrival:    .     Home Medications     Med List Status: In Progress Set By: Lucile Crater, RN at 09/11/2020  7:09 PM                amlodipine-benazepril (LOTREL) 10-20 MG per capsule     Take 1 capsule of your 5mg -10mg  dose capsule. This was prescribed by your primacy care provider.     cyclobenzaprine (FLEXERIL) 5 MG tablet     Take 1 tablet (5 mg total) by mouth 3 (three) times daily as needed for Muscle spasms     docusate sodium (COLACE) 100 MG capsule     Take 1 capsule (100 mg total) by mouth 2 (two) times daily as needed for Constipation     DULoxetine (CYMBALTA) 20 MG capsule     Take 20 mg by mouth daily     folic acid (FOLVITE) 1 MG tablet     Take 1 tablet (1 mg total) by mouth daily     HYDROmorphone (DILAUDID) 2 MG tablet     Take 1 tablet (2 mg total) by mouth every 4 (four) hours as needed for Pain     Loperamide HCl (IMODIUM PO)     Take by mouth     magnesium oxide (MAG-OX) 400 MG tablet     Take 1 tablet (400 mg total) by mouth 2 (two) times daily     Morphine Sulfate ER 15 MG Tablet Extended Release Abuse-Deterrent     Take 15 mg by mouth daily     multivitamin (MULTIVITAMIN) Tab     Take 1 tablet by mouth daily     naproxen sodium (ANAPROX) 220 MG tablet     Take 220 mg by mouth 2 (two) times daily with meals     pramipexole (MIRAPEX) 0.5 MG tablet     Take 0.5 mg by mouth nightly     thiamine (B-1) 100 MG tablet     Take 1 tablet (100 mg total) by mouth daily     traZODone (DESYREL) 50 MG tablet     Take 50 mg by mouth nightly         Allergies: He has No Known Allergies.            VISIT INFORMATION        Reassessments/Clinical Course:      1730: Saw and examined the patient. Vitals normal range. Will obtain labs to work up cause of fall/dizziness. Will give morphine for pain control. CT Head and Rib/Chest imaging to r/o fracture. EKG to r/o arrhythmia.    2010: labs collected, morphine given.    2030: Spoke to daughter on phone and provided update.    2200: No evidence of rib  fracture on x-ray. CT Head with no acute abnormality.    2230: Spoke to hospitalist, will admit to obs.      Conversations with Other Providers:              Medications Given in the ED:    .     ED Medication Orders (From admission, onward)    Start Ordered     Status Ordering Provider    09/11/20 1956 09/11/20 1955  morphine injection 4 mg  Once     Route: Intravenous  Ordered Dose: 4 mg     Last MAR action: Given Barbara Keng E            Procedures:      Procedures      Assessment/Plan:      79 yo M who presented after a fall with LOC, initially concern for head injury and/or fracture which has since been ruled out with imaging. Had mildly elevated troponin, repeat was ordered. Also, upon chart review was found to have history of alcohol withdrawal so ordered alcohol level prior to admission. He is being admitted to the obs for syncope of unknown origin.           Corky Downs, DO  Resident  09/11/20 2237

## 2020-09-12 ENCOUNTER — Encounter (INDEPENDENT_AMBULATORY_CARE_PROVIDER_SITE_OTHER): Payer: Self-pay

## 2020-09-12 ENCOUNTER — Observation Stay: Payer: Medicare Other

## 2020-09-12 LAB — CBC
Absolute NRBC: 0 10*3/uL (ref 0.00–0.00)
Hematocrit: 35.7 % — ABNORMAL LOW (ref 37.6–49.6)
Hgb: 11.7 g/dL — ABNORMAL LOW (ref 12.5–17.1)
MCH: 31.3 pg (ref 25.1–33.5)
MCHC: 32.8 g/dL (ref 31.5–35.8)
MCV: 95.5 fL (ref 78.0–96.0)
MPV: 9.3 fL (ref 8.9–12.5)
Nucleated RBC: 0 /100 WBC (ref 0.0–0.0)
Platelets: 241 10*3/uL (ref 142–346)
RBC: 3.74 10*6/uL — ABNORMAL LOW (ref 4.20–5.90)
RDW: 13 % (ref 11–15)
WBC: 9.22 10*3/uL (ref 3.10–9.50)

## 2020-09-12 LAB — URINALYSIS REFLEX TO MICROSCOPIC EXAM - REFLEX TO CULTURE
Bilirubin, UA: NEGATIVE
Blood, UA: NEGATIVE
Glucose, UA: NEGATIVE
Ketones UA: NEGATIVE
Leukocyte Esterase, UA: NEGATIVE
Nitrite, UA: NEGATIVE
Protein, UR: 30 — AB
Specific Gravity UA: 1.023 (ref 1.001–1.035)
Urine pH: 5 (ref 5.0–8.0)
Urobilinogen, UA: NORMAL mg/dL (ref 0.2–2.0)

## 2020-09-12 LAB — BASIC METABOLIC PANEL
Anion Gap: 10 (ref 5.0–15.0)
BUN: 15 mg/dL (ref 9.0–28.0)
CO2: 18 mEq/L — ABNORMAL LOW (ref 22–29)
Calcium: 8.2 mg/dL (ref 7.9–10.2)
Chloride: 108 mEq/L (ref 100–111)
Creatinine: 1 mg/dL (ref 0.7–1.3)
Glucose: 91 mg/dL (ref 70–100)
Potassium: 3.6 mEq/L (ref 3.5–5.1)
Sodium: 136 mEq/L (ref 136–145)

## 2020-09-12 LAB — ECG 12-LEAD
Atrial Rate: 65 {beats}/min
P Axis: 35 degrees
P-R Interval: 140 ms
Q-T Interval: 414 ms
QRS Duration: 88 ms
QTC Calculation (Bezet): 430 ms
R Axis: -22 degrees
T Axis: 15 degrees
Ventricular Rate: 65 {beats}/min

## 2020-09-12 LAB — PHOSPHORUS: Phosphorus: 3.4 mg/dL (ref 2.3–4.7)

## 2020-09-12 LAB — GFR: EGFR: >60

## 2020-09-12 LAB — MAGNESIUM: Magnesium: 1.3 mg/dL — ABNORMAL LOW (ref 1.6–2.6)

## 2020-09-12 MED ORDER — LISINOPRIL 10 MG PO TABS
ORAL_TABLET | Freq: Every day | ORAL | Status: DC
Start: 2020-09-12 — End: 2020-09-12
  Filled 2020-09-12 (×3): qty 1

## 2020-09-12 MED ORDER — POTASSIUM CHLORIDE CRYS ER 20 MEQ PO TBCR
40.0000 meq | EXTENDED_RELEASE_TABLET | Freq: Once | ORAL | Status: AC
Start: 2020-09-12 — End: 2020-09-12
  Administered 2020-09-12: 06:00:00 40 meq via ORAL
  Filled 2020-09-12: qty 2

## 2020-09-12 MED ORDER — POTASSIUM CHLORIDE CRYS ER 20 MEQ PO TBCR
40.0000 meq | EXTENDED_RELEASE_TABLET | ORAL | Status: DC
Start: 2020-09-12 — End: 2020-09-12

## 2020-09-12 MED ORDER — MAGNESIUM SULFATE IN D5W 1-5 GM/100ML-% IV SOLN
1.0000 g | INTRAVENOUS | Status: AC
Start: 2020-09-12 — End: 2020-09-12
  Administered 2020-09-12 (×2): 1 g via INTRAVENOUS
  Filled 2020-09-12 (×2): qty 100

## 2020-09-12 MED ORDER — LORAZEPAM 2 MG/ML IJ SOLN
1.0000 mg | INTRAMUSCULAR | Status: DC | PRN
Start: 2020-09-12 — End: 2020-09-12

## 2020-09-12 MED ORDER — INFLUENZA VAC A&B SA ADJ QUAD 0.5 ML IM PRSY
0.5000 mL | PREFILLED_SYRINGE | INTRAMUSCULAR | Status: DC | PRN
Start: 2020-09-12 — End: 2020-09-12
  Filled 2020-09-12: qty 0.5

## 2020-09-12 NOTE — Discharge Summary (Signed)
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 Micro / Labs / Path pending:   Unresulted Labs     None        -- Follow up with your primary care provider in one week. You will need your labs rechecked at this appointment.   -- Follow up with cardiology in one week. I recommend an echocardiogram and an outpatient holter monitor to further assess your heart.   -- Resume all your regular home medications.   -- Eat a well balanced diet and stay well hydrated.   -- For the time being, I recommend you completely stop drinking alcohol.   -- For pain, you can take your regularly prescribed home medications. Additionally you can use tylenol as directed on the bottle and over the counter topical pain relieves such as bengay or lidocaine 4% patches.     Recent Labs:       Results     Procedure Component Value Units Date/Time    Magnesium [[id number]]  (Abnormal) Collected: 09/12/20 0244    Specimen: Blood Updated: 09/12/20 0333     Magnesium 1.3 mg/dL     Narrative:      Alla German X 3  Starting For 1 Occurences    Phosphorus [[id number]] Collected: 09/12/20 0244    Specimen: Blood Updated: 09/12/20 0333     Phosphorus 3.4 mg/dL     Narrative:      Alla German X 3  Starting For 1 Occurences    GFR [[id number]] Collected: 09/12/20 0244     Updated: 09/12/20 0330     EGFR >60.0    Basic Metabolic Panel [[id number]]  (Abnormal) Collected:  09/12/20 0244    Specimen: Blood Updated: 09/12/20 0330     Glucose 91 mg/dL      BUN 62.9 mg/dL      Creatinine 1.0 mg/dL      Calcium 8.2 mg/dL      Sodium 528 mEq/L      Potassium 3.6 mEq/L      Chloride 108 mEq/L      CO2 18 mEq/L      Anion Gap 10.0    CBC without differential [[id number]]  (Abnormal) Collected: 09/12/20 0244    Specimen: Blood Updated: 09/12/20 0308     WBC 9.22 x10 3/uL      Hgb 11.7 g/dL      Hematocrit 27.2 %      Platelets 241 x10 3/uL      RBC 3.74 x10 6/uL      MCV 95.5 fL      MCH 31.3 pg      MCHC 32.8 g/dL      RDW 13 %      MPV 9.3 fL      Nucleated RBC 0.0 /100 WBC  Absolute NRBC 0.00 x10 3/uL     UA Reflex to Micro - Reflex to Culture [[id number]]  (Abnormal) Collected: 09/12/20 0107    Specimen: Urine, Clean Catch Updated: 09/12/20 0130     Urine Type Urine, Clean Ca     Color, UA Yellow     Clarity, UA Clear     Specific Gravity UA 1.023     Urine pH 5.0     Leukocyte Esterase, UA Negative     Nitrite, UA Negative     Protein, UR 30     Glucose, UA Negative     Ketones UA Negative     Urobilinogen, UA Normal mg/dL      Bilirubin, UA Negative     Blood, UA Negative     RBC, UA 0 - 2 /hpf      WBC, UA 0 - 5 /hpf      Hyaline Casts, UA 0 - 2 /lpf     Troponin I [[id number]] Collected: 09/11/20 2256    Specimen: Blood Updated: 09/11/20 2334     Troponin I 0.04 ng/mL     Ethanol (Alcohol) Level [[id number]] Collected: 09/11/20 2228    Specimen: Blood Updated: 09/11/20 2302     Alcohol None Detected mg/dL     GFR [[id number]] Collected: 09/11/20 2007     Updated: 09/11/20 2037     EGFR >60.0    Comprehensive metabolic panel [[id number]]  (Abnormal) Collected: 09/11/20 2007    Specimen: Blood Updated: 09/11/20 2037     Glucose 190 mg/dL      BUN 24.4 mg/dL      Creatinine 1.1 mg/dL      Sodium 010 mEq/L      Potassium 3.2 mEq/L      Chloride 109 mEq/L      CO2 18 mEq/L      Calcium 7.9 mg/dL      Protein, Total 4.6 g/dL      Albumin 2.6 g/dL      AST (SGOT) 7 U/L      ALT 13 U/L       Alkaline Phosphatase 77 U/L      Bilirubin, Total 0.4 mg/dL      Globulin 2.0 g/dL      Albumin/Globulin Ratio 1.3     Anion Gap 8.0    Troponin I [[id number]]  (Abnormal) Collected: 09/11/20 2007    Specimen: Blood Updated: 09/11/20 2037     Troponin I 0.07 ng/mL     CBC and differential [[id number]]  (Abnormal) Collected: 09/11/20 2007    Specimen: Blood Updated: 09/11/20 2025     WBC 7.45 x10 3/uL      Hgb 10.5 g/dL      Hematocrit 63.8 %      Platelets 221 x10 3/uL      RBC 3.27 x10 6/uL      MCV 94.5 fL      MCH 32.1 pg      MCHC 34.0 g/dL      RDW 14 %      MPV 9.3 fL      Neutrophils 75.4 %      Lymphocytes Automated 17.0 %      Monocytes 6.2 %      Eosinophils Automated 0.4 %      Basophils Automated 0.1 %      Immature Granulocytes 0.9 %      Nucleated RBC 0.0 /100 WBC      Neutrophils Absolute 5.61 x10 3/uL  Lymphocytes Absolute Automated 1.27 x10 3/uL      Monocytes Absolute Automated 0.46 x10 3/uL      Eosinophils Absolute Automated 0.03 x10 3/uL      Basophils Absolute Automated 0.01 x10 3/uL      Immature Granulocytes Absolute 0.07 x10 3/uL      Absolute NRBC 0.00 x10 3/uL           Procedures/Radiology performed:   Radiology: all results from this admission  XR Ribs Left 2 Views    Result Date: 09/11/2020  1.  No evidence of displaced rib fracture. 2.  Postsurgical and degenerative changes of the spine, incompletely evaluated on this exam. Carla Drape, MD  09/11/2020 9:51 PM    Ribs Right with PA Chest    Result Date: 09/11/2020  1.  No evidence of displaced rib fracture. 2.  Postsurgical and degenerative changes of the spine, incompletely evaluated on this exam. Carla Drape, MD  09/11/2020 9:51 PM    Lumbar Spine AP and Lateral    Result Date: 09/11/2020  1.  Postsurgical and degenerative changes of the lumbar spine, as described above. 2.  Multiple superior endplate compression deformities at the thoracolumbar junction, most pronounced at L1, which are of uncertain chronicity. Carla Drape, MD   09/11/2020 10:36 PM    CT Head WO Contrast    Result Date: 09/11/2020   No acute traumatic intracranial abnormality is seen. Leandro Reasoner, MD  09/11/2020 10:09 PM    Cervical Spine 2 or 3 Views    Result Date: 09/12/2020   No visualized acute fracture. If there is continued clinical suspicion, CT scan of the cervical spine is recommended. Theador Hawthorne, MD  09/12/2020 2:36 AM       Hospital Course:     Reason for admission/ HPI:   79 year old male with a PMHx HTN, HLD, degenerative lumbar stenosis, chronic pain on methadone, history of bilateral PE, anemia, EtOH daily use, syncopal episodes admitted on 09/11/2020 with syncope with brief LOC.  He reports that he was getting out of his car and walking to his house when he experienced dizziness and fell on his front door.  He thinks he was passed out for about 10 seconds but then was able to quickly oriented to his surroundings.  He complained of left rib pain from hitting the ground on that side with some bruising over his left eyebrow.    Hospital Course:   In the ED, initial labs were pertinent for H/H of 10.5/30.9, BG 190, sodium 135, potassium 3.2, CO2 18, troponin 0.7.  EKG shows sinus rhythm with PACs, heart rate 65, nonischemic.  His imaging includes CT of the head, x-ray of the lumbar spine, x-ray of the left and right ribs, x-ray of the cervical spine and all were without acute findings except for the x-ray of the lumbar spine which showed multiple superior endplate compression deformities at the thoracolumbar junction, most pronounced at L1, which are of uncertain chronicity.  His troponin peaked at 0.7 and is likely due to demand ischemia from the fall.  He was monitored on telemetry and was without acute or adverse events during the night.  His electrolytes were repleted to include magnesium and potassium.  Would query if he is drinking more alcohol than he endorses versus polypharmacy versus symptomatic PACs that caused the syncope.  Orthostatic blood pressure  negative.  PT recommended home with supervision.  You will need to have close follow-up with his PCP and have repeat  labs within 3 to 5 days.  Given his ongoing PACs and syncope recommend he follow-up with cardiology as an outpatient within 1 week for consideration of an echocardiogram and a holter monitor to further assess his PAC burden (not significant here) and look for abnormal heart rhythm that could be contributing to his syncope.    Of note, patient is relocating to his daughter's house in [place], but is staying with his daughter here for the time being. Daughter is in [place] currently and patient has been using [employer] to get his meals, but notes "it gets expensive" after a while and endorses poor PO intake the day of admission.     Discharge Day Exam:  Temp:  [97.2 F (36.2 C)-98.8 F (37.1 C)] 97.2 F (36.2 C)  Heart Rate:  [64-82] 69  Resp Rate:  [16-19] 17  BP: (127-174)/(68-101) 127/73    General: awake, alert, oriented x 3; no acute distress.  HEENT: perrla, eomi, sclera anicteric  oropharynx clear without lesions, mucous membranes moist. Bruise above left eyebrow  Neck: supple, no lymphadenopathy, no thyromegaly, no JVD, no carotid bruits  Cardiovascular: regular rate and rhythm, no murmurs, rubs or gallops  Lungs: clear to auscultation bilaterally, without wheezing, rhonchi, or rales  Abdomen: soft, non-tender, non-distended; no palpable masses, no hepatosplenomegaly, normoactive bowel sounds, no rebound or guarding  Extremities: no clubbing, cyanosis, or edema  Neuro: cranial nerves grossly intact, strength 5/5 in upper and lower extremities, sensation intact,   Skin: no rashes or lesions noted      Consultations:     Treatment Team:   Attending Provider: Jerral Ralph, MD  Resident: Corky Downs, DO  Consulting Physician: Roxana Hires, MD    Discharge Condition:     Stable    Discharge Instructions & Follow Up Plan for Patient:     Diet: Heart Healthy  Activity/Weight Bearing  Status: As tolerated/Full      Patient was instructed to follow up with:     Follow-up Information     [facility] Follow up.    Specialty: Family Medicine  Contact information:  [address]  [place] [address]  [phone]  Additional information:  From the [place]:   Take [address]. Take exit [address] to merge onto [address] toward [place].    Turn right onto [address]. Your destination will be on the left.       From the [place]:   Take [address] towards [address]. Merge onto [address]. Take exit [address]A to merge onto [address]/   [facility] toward [address]. Turn right onto [address]. Your destination will be on the left.            [facility] Follow up.    Contact information:  [address]  [place] [address]  [phone]                   Discharge Code Status: Full  Patient Emergency Contact: Extended Emergency Contact Information  Primary Emergency Contact: Vega,Bonnae   [place]  Home Phone: [phone]  Relation: Daughter      Complete instructions and follow up are in the patient's After Visit Summary    Minutes spent coordinating discharge and reviewing discharge plan: 40 minutes    Discharge Medications:        Discharge Medication List      Taking    amlodipine-benazepril 10-20  MG per capsule  Commonly known as: LOTREL  Take 1 capsule of your 5mg -10mg  dose capsule. This was prescribed by your primacy care provider.     atorvastatin 10 MG tablet  Dose: 10 mg  Commonly known as: LIPITOR  Take 10 mg by mouth daily     cyclobenzaprine 5 MG tablet  Dose: 5 mg  Commonly known as: FLEXERIL  Take 1 tablet (5 mg total) by mouth 3 (three) times daily as needed for Muscle spasms     docusate sodium 100 MG capsule  Dose: 100 mg  Commonly known as: COLACE  Take 1 capsule (100 mg total) by mouth 2 (two) times daily as needed for Constipation     DULoxetine 20 MG capsule  Dose: 20 mg  Commonly known as:  CYMBALTA  Take 20 mg by mouth daily     folic acid 1 MG tablet  Dose: 1 mg  Commonly known as: FOLVITE  Take 1 tablet (1 mg total) by mouth daily     gabapentin 300 MG capsule  Dose: 300 mg  Commonly known as: NEURONTIN  Take 300 mg by mouth 3 (three) times daily     HYDROmorphone 2 MG tablet  Dose: 2 mg  Commonly known as: DILAUDID  Take 1 tablet (2 mg total) by mouth every 4 (four) hours as needed for Pain     IMODIUM PO  Take by mouth     magnesium oxide 400 MG tablet  Dose: 400 mg  Commonly known as: MAG-OX  Take 1 tablet (400 mg total) by mouth 2 (two) times daily     methadone 5 MG tablet  Dose: 5 mg  Commonly known as: DOLOPHINE  Take 5 mg by mouth every 8 (eight) hours     Morphine Sulfate ER 15 MG Tbea  Dose: 15 mg  Take 15 mg by mouth daily     multivitamin Tabs  Dose: 1 tablet  Take 1 tablet by mouth daily     naproxen sodium 220 MG tablet  Dose: 220 mg  Commonly known as: ANAPROX  Take 220 mg by mouth 2 (two) times daily with meals     pramipexole 0.5 MG tablet  Dose: 0.5 mg  Commonly known as: MIRAPEX  Take 0.5 mg by mouth nightly     thiamine 100 MG tablet  Dose: 100 mg  Commonly known as: B-1  Take 1 tablet (100 mg total) by mouth daily     traZODone 50 MG tablet  Dose: 50 mg  Commonly known as: DESYREL  Take 50 mg by mouth nightly              [facility]   Department of Medicine   P: [phone]   F: [phone]    Discussed with attending physician: Jerral Ralph., MD  Signed by: Brock Bad, FNP    CC: Pcp, None, MD

## 2020-09-12 NOTE — Plan of Care (Signed)
Problem: Moderate/High Fall Risk Score >5  Goal: Patient will remain free of falls  09/12/2020 0201 by Charisse March, RN  Outcome: Progressing  Flowsheets (Taken 09/11/2020 2345)  High (Greater than 13):   HIGH-Visual cue at entrance to patient's room   HIGH-Bed alarm on at all times while patient in bed   HIGH-Utilize chair pad alarm for patient while in the chair   HIGH-Apply yellow "Fall Risk" arm band   HIGH-Pharmacy to initiate evaluation and intervention per protocol   HIGH-Initiate use of floor mats as appropriate   HIGH-Consider use of low bed  09/12/2020 0200 by Charisse March, RN  Outcome: Progressing     Problem: Safety  Goal: Patient will be free from injury during hospitalization  09/12/2020 0201 by Charisse March, RN  Outcome: Progressing  Flowsheets (Taken 09/12/2020 0201)  Patient will be free from injury during hospitalization:   Assess patient's risk for falls and implement fall prevention plan of care per policy   Provide and maintain safe environment   Use appropriate transfer methods   Ensure appropriate safety devices are available at the bedside   Hourly rounding   Include patient/ family/ care giver in decisions related to safety   Assess for patients risk for elopement and implement Elopement Risk Plan per policy   Provide alternative method of communication if needed (communication boards, writing)  09/12/2020 0200 by Charisse March, RN  Outcome: Progressing  Goal: Patient will be free from infection during hospitalization  09/12/2020 0201 by Charisse March, RN  Outcome: Progressing  Flowsheets (Taken 09/12/2020 0201)  Free from Infection during hospitalization:   Assess and monitor for signs and symptoms of infection   Monitor lab/diagnostic results   Monitor all insertion sites (i.e. indwelling lines, tubes, urinary catheters, and drains)   Encourage patient and family to use good hand hygiene technique  09/12/2020 0200 by Charisse March, RN  Outcome: Progressing     Problem: Pain  Goal: Pain  at adequate level as identified by patient  09/12/2020 0201 by Charisse March, RN  Outcome: Progressing  Flowsheets (Taken 09/12/2020 0201)  Pain at adequate level as identified by patient:   Identify patient comfort function goal   Assess for risk of opioid induced respiratory depression, including snoring/sleep apnea. Alert healthcare team of risk factors identified.   Assess pain on admission, during daily assessment and/or before any "as needed" intervention(s)   Reassess pain within 30-60 minutes of any procedure/intervention, per Pain Assessment, Intervention, Reassessment (AIR) Cycle   Evaluate if patient comfort function goal is met   Evaluate patient's satisfaction with pain management progress   Offer non-pharmacological pain management interventions   Consult/collaborate with Physical Therapy, Occupational Therapy, and/or Speech Therapy   Include patient/patient care companion in decisions related to pain management as needed  09/12/2020 0200 by Charisse March, RN  Outcome: Progressing     Problem: Side Effects from Pain Analgesia  Goal: Patient will experience minimal side effects of analgesic therapy  09/12/2020 0201 by Charisse March, RN  Outcome: Progressing  Flowsheets (Taken 09/12/2020 0201)  Patient will experience minimal side effects of analgesic therapy:   Monitor/assess patient's respiratory status (RR depth, effort, breath sounds)   Assess for changes in cognitive function   Prevent/manage side effects per LIP orders (i.e. nausea, vomiting, pruritus, constipation, urinary retention, etc.)   Evaluate for opioid-induced sedation with appropriate assessment tool (i.e. POSS)  09/12/2020 0200 by Charisse March, RN  Outcome: Progressing     Problem: Discharge Barriers  Goal:  Patient will be discharged home or other facility with appropriate resources  09/12/2020 0201 by Charisse March, RN  Outcome: Progressing  Flowsheets (Taken 09/12/2020 0201)  Discharge to home or other facility with appropriate  resources: Provide appropriate patient education  09/12/2020 0200 by Charisse March, RN  Outcome: Progressing     Problem: Psychosocial and Spiritual Needs  Goal: Demonstrates ability to cope with hospitalization/illness  09/12/2020 0201 by Charisse March, RN  Outcome: Progressing  Flowsheets (Taken 09/12/2020 0201)  Demonstrates ability to cope with hospitalizations/illness:   Encourage verbalization of feelings/concerns/expectations   Provide quiet environment   Encourage patient to set small goals for self   Assist patient to identify own strengths and abilities   Reinforce positive adaptation of new coping behaviors   Encourage participation in diversional activity   Include patient/ patient care companion in decisions  09/12/2020 0200 by Charisse March, RN  Outcome: Progressing

## 2020-09-12 NOTE — Progress Notes (Signed)
09/12/20 0740 09/12/20 0742 09/12/20 0744   Vital Signs   Heart Rate 67 65 69   Resp Rate 17  --   --    BP 140/78 145/84 127/73   BP Location Left arm Left arm Left arm   MAP (mmHg) 99 105  --    Patient Position Lying Sitting Standing      Patient denied dizziness, c/o left sided rib pain when ambulating.

## 2020-09-12 NOTE — Plan of Care (Addendum)
Adult Observation Progress Note    Shift Note:    General: Patient VSS, in no apparent distress at this time. Neck brace still on. Removed by NP during trio rounds, pt tolerated well.  Neuro: Patient is AOx4. Patient denies numbness or tingling. Perrla.   Cardio: SR on telemetry, verified with [facility]. S1, S2 auscultated.   Resp: Patient on roomair with lung sounds clear bilaterally on auscultation.   Integ: Skin intact, bruising to left eyebrow noted.  MSK: Patient ambulates: with walker with: standby assist. low/mod/high: High falls risk.   GI: Bowel sounds auscultated all 4 quadrants.   GU: Patient is continent of urine.   IV Access: IV: 18g  in: LAC.     No significant events or provider communication. Patient 0 pain at this time, and is resting comfortably in bed.     BM during shift: YES/NO: no LBM 09/11/20    Pending Orders:   Pain Management  PT Eval    Discharge Plan: To TBD, pending medical clearance    Social/Family Visits:   N/A    POC update: pt updated with POC during bedside report      Vitals:    09/12/20 0515 09/12/20 0740 09/12/20 0742 09/12/20 0744   BP: 154/72 140/78 145/84 127/73   Pulse: 70 67 65 69   Resp: 16 17     Temp: 98.1 F (36.7 C) 97.2 F (36.2 C)     TempSrc:  Oral     SpO2:  100% 98%    Weight:       Height:           Patient Lines/Drains/Airways Status      Active Lines, Drains and Airways       Name:   Placement date:   Placement time:   Site:   Days:    Peripheral IV 09/11/20 18 G Left Antecubital   09/11/20    1910    Antecubital   less than 1                    Problem: Moderate/High Fall Risk Score >5  Goal: Patient will remain free of falls  Outcome: Progressing  Flowsheets (Taken 09/12/2020 0701)  High (Greater than 13):   HIGH-Visual cue at entrance to patient's room   HIGH-Bed alarm on at all times while patient in bed   HIGH-Utilize chair pad alarm for patient while in the chair   HIGH-Pharmacy to initiate evaluation and intervention per protocol   HIGH-Apply yellow "Fall  Risk" arm band   HIGH-Initiate use of floor mats as appropriate   HIGH-Consider use of low bed     Problem: Safety  Goal: Patient will be free from injury during hospitalization  Outcome: Progressing  Flowsheets (Taken 09/12/2020 0201 by Charisse March, RN)  Patient will be free from injury during hospitalization:   Assess patient's risk for falls and implement fall prevention plan of care per policy   Provide and maintain safe environment   Use appropriate transfer methods   Ensure appropriate safety devices are available at the bedside   Hourly rounding   Include patient/ family/ care giver in decisions related to safety   Assess for patients risk for elopement and implement Elopement Risk Plan per policy   Provide alternative method of communication if needed (communication boards, writing)  Goal: Patient will be free from infection during hospitalization  Outcome: Progressing  Flowsheets (Taken 09/12/2020 0201 by Charisse March, RN)  Free from Infection  during hospitalization:   Assess and monitor for signs and symptoms of infection   Monitor lab/diagnostic results   Monitor all insertion sites (i.e. indwelling lines, tubes, urinary catheters, and drains)   Encourage patient and family to use good hand hygiene technique     Problem: Pain  Goal: Pain at adequate level as identified by patient  Outcome: Progressing  Flowsheets (Taken 09/12/2020 0201 by Charisse March, RN)  Pain at adequate level as identified by patient:   Identify patient comfort function goal   Assess for risk of opioid induced respiratory depression, including snoring/sleep apnea. Alert healthcare team of risk factors identified.   Assess pain on admission, during daily assessment and/or before any "as needed" intervention(s)   Reassess pain within 30-60 minutes of any procedure/intervention, per Pain Assessment, Intervention, Reassessment (AIR) Cycle   Evaluate if patient comfort function goal is met   Evaluate patient's  satisfaction with pain management progress   Offer non-pharmacological pain management interventions   Consult/collaborate with Physical Therapy, Occupational Therapy, and/or Speech Therapy   Include patient/patient care companion in decisions related to pain management as needed     Problem: Side Effects from Pain Analgesia  Goal: Patient will experience minimal side effects of analgesic therapy  Outcome: Progressing  Flowsheets (Taken 09/12/2020 0201 by Charisse March, RN)  Patient will experience minimal side effects of analgesic therapy:   Monitor/assess patient's respiratory status (RR depth, effort, breath sounds)   Assess for changes in cognitive function   Prevent/manage side effects per LIP orders (i.e. nausea, vomiting, pruritus, constipation, urinary retention, etc.)   Evaluate for opioid-induced sedation with appropriate assessment tool (i.e. POSS)     Problem: Discharge Barriers  Goal: Patient will be discharged home or other facility with appropriate resources  Outcome: Progressing  Flowsheets (Taken 09/12/2020 0201 by Charisse March, RN)  Discharge to home or other facility with appropriate resources: Provide appropriate patient education     Problem: Psychosocial and Spiritual Needs  Goal: Demonstrates ability to cope with hospitalization/illness  Outcome: Progressing  Flowsheets (Taken 09/12/2020 0201 by Charisse March, RN)  Demonstrates ability to cope with hospitalizations/illness:   Encourage verbalization of feelings/concerns/expectations   Provide quiet environment   Encourage patient to set small goals for self   Assist patient to identify own strengths and abilities   Reinforce positive adaptation of new coping behaviors   Encourage participation in diversional activity   Include patient/ patient care companion in decisions

## 2020-09-12 NOTE — PT Eval Note (Signed)
[facility]   Physical Therapy Evaluation   Patient: Matthew [name]    MRN#: [id number]   Unit: [facility]  Bed: F625/F625.01    Post Acute Care Therapy Recommendations:   Discharge Recommendations: Home with supervision (and use of cane at home for balance with gait)     Milestones to be reached to achieve recommendation: NA    DME Recommended for Discharge: Patient already has needed equipment    If Home with supervision (and use of cane at home for balance with gait) recommended discharge disposition is not available, patient will need supervision assist for mobility, use of cane for ambulation equipment, and no further PT needs.     Therapy discharge recommendations may change with patient status.  Please refer to most recent note for up-to-date recommendations.    Assessment:   Significant Findings: some dizziness after performed multiple tasks but SBP in the 110's.  Nursing made aware    Matthew [name] is a 79 y.o. male admitted 09/11/2020.  Patient presents with ability to perform all functional tasks although still with rib pain in the upper left quadrant.  Some dif with full gaze stability but no nystagmus noted.  Perform ambulation in room with hand hold assist and no LOB.  Educated on safety for stairs and need for use of cane on outside of house.  At present, no further PT needs and will [place] from acute PT.       Impairments: Assessment: Appears to be at baseline for mobility;Appears to be at baseline for balance.     Therapy Diagnosis: impaired mobility    Rehabilitation Potential: Prognosis: Good    Treatment Activities: Evaluation Discharge Therapeutic Exercise Transfers Gait Neuromuscular Re-ed Safety Education HEP Pt/Family Training    Educated the patient to role of physical therapy, plan of care, goals of therapy and safety with mobility and ADLs, home safety.    Plan:   Treatment/Interventions: No skilled interventions needed at this time     PT  Frequency: one time visit - therapy discontinued   Risks/Benefits/POC Discussed with Pt/Family: With patient        Precautions and Contraindications:   Weight Bearing Status: no restrictions  Other Precautions: falls, minor dizziness    Activity Orders:  PMP    Consult received for [facility]on[facility] Housekeeper for PT Evaluation and Treatment.  Patients medical condition is appropriate for Physical therapy intervention at this time.    Medical Diagnosis: Syncope and collapse [R55]    History of Present Illness:   Matthew [name] is a 79 y.o. male admitted on 09/11/2020 with fall after got dizzy and fell on left side.  Per chart, brief LOC and hit head.      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 -  does not remember type of skin ca    Pancreatitis 2014    Pulmonary embolism, 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    Spinal stenosis     Spondylolisthesis     Syncopal episodes approx 2012    1x episode - had negative cardiac work-up - no episodes sinice     Past Surgical History:   Procedure Laterality Date    APPENDECTOMY  1952    CERVICAL FUSION  1997    states has full ROM of neck    CHOLECYSTECTOMY  1980    ERCP, STENT  2007    LAMINECTOMY, POSTERIOR LUMBAR, DECOMP, FUSION, LEVEL 2 N/A 11/10/2015    Procedure: LAMINECTOMY, POSTERIOR LUMBAR, DECOMP, FUSION, LEVEL 2;  Surgeon: Clelia Schaumann, MD;  Location: [facility];  Service: Orthopedics;  Laterality: N/A;  L4-S1 LAMINECTOMY/FUSION     REMOVAL, POSTERIOR LUMBAR SPINE HARDWARE N/A 05/20/2018    Procedure: REMOVAL POSTERIOR LUMBAR SPINE HARDWARE L4-S1;  Surgeon: Clelia Schaumann, MD;  Location: [facility];  Service: Orthopedics;  Laterality: N/A;  REMOVAL OF HARDWARE LUMBAR SPINE    SUPRAPUBIC PROSTATECTOMY  2007    TONSILLECTOMY  as child    VASECTOMY  1980's       X-Rays/Tests/Labs:  XR Ribs Left 2 Views    Result Date: 09/11/2020  1.  No evidence of displaced rib fracture. 2.  Postsurgical and degenerative changes of the spine, incompletely evaluated on this exam. Carla Drape, MD  09/11/2020 9:51 PM    Ribs Right with PA Chest    Result Date: 09/11/2020  1.  No evidence of displaced rib fracture. 2.  Postsurgical and degenerative changes of the spine, incompletely evaluated on this exam. Carla Drape, MD  09/11/2020 9:51 PM    Lumbar Spine AP and Lateral    Result Date: 09/11/2020  1.  Postsurgical and degenerative changes of the lumbar spine, as described above. 2.  Multiple superior endplate compression deformities at the thoracolumbar junction, most pronounced at L1, which are of uncertain chronicity. Carla Drape, MD  09/11/2020 10:36 PM    CT Head WO Contrast    Result Date: 09/11/2020   No acute traumatic intracranial abnormality is seen. Leandro Reasoner, MD  09/11/2020 10:09 PM    Cervical Spine 2 or 3 Views    Result Date: 09/12/2020   No visualized acute fracture. If there is continued clinical suspicion, CT scan of the cervical spine is recommended. Theador Hawthorne, MD  09/12/2020 2:36 AM    Lab Results   Component Value Date/Time    HGB 11.7 (L) 09/12/2020 02:44 AM          HCT 35.7 (L) 09/12/2020 02:44 AM    K 3.6 09/12/2020 02:44 AM    NA 136 09/12/2020 02:44 AM          TROPI 0.04 09/11/2020 10:56 PM    TROPI 0.07 (H) 09/11/2020 08:07 PM     Social History:      Prior Level of Function:  Prior level of function: Independent with ADLs, Ambulates with assistive device (use of cane most of time)  Assistive Device: Single point cane  Baseline  Activity Level: Community ambulation  Driving: independent  Cooking: Yes  Employment: Retired  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  dtr is presently on vacation and not in the [place].  Per pt, ex-wife is at the home  as well but will be unable to assist with care if needed.  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 Nursing clears patient for therapy.     Patient Goal: get better    Pain Assessment  Pain Assessment: Numeric Scale (0-10)  Pain Score: 6-moderate pain  Pain Location: Rib cage  Pain Orientation: Left  Pain Intervention(s): Medication (See eMAR)    Objective:   Observation of Patient/Vital Signs:  Patient is in bed with telemetry and peripheral IV in place.    Pt wore mask during therapy session:No      Observation of Patient/Vital signs:    Vital signs:  SBP in the 110's after ambulation and standing tasks    Inspection/Posture: supine in bed talking on cell phone    Cognition/Neuro Status  Arousal/Alertness: Appropriate responses to stimuli  Attention Span: Appears intact  Orientation Level: Oriented X4  Memory: Appears intact  Following Commands: Follows all commands and directions without difficulty  Safety Awareness: independent  Insights: Fully aware of deficits  Problem Solving: Able to problem solve independently  Behavior: calm;cooperative  Motor Planning: intact  Coordination: intact    Sensation:  intact  Vision:  Slight dif with gaze stabilization    Musculoskeletal  Examination:  Gross ROM  Right Upper Extremity ROM: within functional limits  Left Upper Extremity ROM: within functional limits  Right Lower Extremity ROM: within functional limits  Left Lower Extremity ROM: within functional limits    Gross Strength  Right Upper Extremity Strength: 4-/5  Left Upper Extremity Strength: 4-/5  Right Lower Extremity Strength: 4+/5  Left Lower Extremity Strength: 4+/5    Tone  Tone: within functional limits    Functional Mobility:  Supine to Sit: Independent  Sit to Supine: Independent  Sit to Stand: Stand by Assist  Stand to Sit: Stand by Assist         Ambulation:  PMP - Progressive Mobility Protocol   PMP Activity: Step 6 - Walks in Room  Distance Walked (ft) (Step 6,7): 50 Feet  Ambulation: Contact Guard Assist (hand hold assist only and no LOB)  Pattern: decreased step length;decreased cadence  Stair Management:  (reviewed technique for stairs with use of cane)    Balance:  Balance: needs focused assessment  Sitting - Static: Good  Sitting - Dynamic: Good  Standing - Static: Good  Standing - Dynamic: Fair       Participation and Activity Tolerance:  Participation Effort: good  Endurance: Tolerates < 10 min exercise, no significant change in vital signs    Patient left with call bell within reach, all needs met, SCDs off, fall mat in place, bed alarm on, and all questions answered. RN notified of session outcome and patient response.     Goals:   Goals  Goal Formulation: With patient  Time for Goal Acheivement:  (none required at present)     PPE worn during session: procedural mask, goggles, gown and gloves    Tech present: NA  PPE worn by tech: N/A  Mick Sell, PT 10:23 AM 09/12/2020  Pager [phone]     Time of treatment:   PT Received On: 09/12/20  Start Time: 6045  Stop Time: 1005  Time Calculation (min): 30 min

## 2020-09-12 NOTE — Discharge Instr - AVS First Page (Addendum)
Reason for your Hospital Admission:  Syncope. You were found to have low magnesium, low sodium, and low potassium.       Instructions for after your discharge:  -- If you will be staying in the [place] area for the next several days, we will provide information to see a primary care provider and a cardiologist locally in the area  -- Follow up with your primary care provider in one week. You will need your labs rechecked at this appointment.   -- Follow up with cardiology in one week. I recommend an echocardiogram and an outpatient holter monitor to further assess your heart.   -- Resume all your regular home medications.   -- Eat a well balanced diet and stay well hydrated.   -- For the time being, I recommend you completely stop drinking alcohol.   -- For pain, you can take your regularly prescribed home medications. Additionally you can use tylenol as directed on the bottle and over the counter topical pain relieves such as bengay or lidocaine 4% patches. You may also alternate heat and ice packs for rib pain    Return to the nearest emergency department for chest pain, shortness of breath, fever >100.5 degrees, dizziness, syncope, profuse vomiting or diarrhea, or any other concerning symptoms.

## 2020-09-12 NOTE — Progress Notes (Signed)
[facility] ([facility]viously [facility]):     Received a referral to schedule a follow up appointment with the [facility] [employer].  Appointment scheduled for 09/14/20 at 3:30pm with Edward Jolly at [place] location.      Clinic address is as follows:    [address] [place], [address]      Please notify patient to arrive 15 minutes early to the appointment and bring the following materials with them:    Insurance card (if insured) and photo ID  Medications in their original bottles  Glucometer/blood sugar log (if diabetic)  Weight log (if heart failure)  Proof of income (to enroll in medication assistance programs-first two pages of signed 1040 tax forms or last 2 months of pay stubs)    Wynetta Fines  Clinical Program Administrator  [facility]  (Previously-[facility])  [address]  [place], [place] [address]  [place] [phone]

## 2020-09-12 NOTE — Progress Notes (Signed)
ADULT OBSERVATION UNIT  DISCHARGE NOTE      Date Time: 09/12/20 12:26 PM  Patient Name: Matthew [name]  Attending Physician: Jerral Ralph, MD      Date of Admission:   09/11/2020        Discharge Instructions:        Patient stable for discharge. Discharge instructions given about md f/u, s/sx, patient teachings done and verbalized understanding. All questions answered at this time. Aware to follow-up and schedule appointments as necessary. IV access and cardiac monitor discontinued. Discharge to home. Cab services provided to patient.    Wheelchair  transport requested.    Patient aware to follow-up with MD and PMD as needed.     Signed by: Beaulah Dinning

## 2020-09-12 NOTE — Progress Notes (Signed)
NURSING ADMISSION NOTE    Date Time: 09/12/20 2:02 AM  Patient Name: Matthew [name] DAVID  Attending Physician: Jerral Ralph, MD    Date of Admission:   09/11/2020    Reason for Admission:   Syncope and collapse [R55]    Admitted from:   ED    Nursing Note:   Patient AOx4, English speaking, and VSS. On tele, NSR, confirmed with [facility], and alerted Charge Nurse [place]. Oriented patient to the floor and POC. Bed to low position. Call bell within reach.     Patient scores as High Falls risk. Patient ambulates with x1 assist and walker. Bed alarm on and floor mats in place. Patient belongings reviewed, confirmed home medications were not brought in. Patient skin intact, no wounds present, bruising above left eyebrow. 4 eyes in 4 hours with RN Erie Noe. Patient reports pain as 9/10, standard interventions in place.    Active Lines and Wounds:      Patient Lines/Drains/Airways Status    Active Lines, Drains and Airways     Name:   Placement date:   Placement time:   Site:   Days:    Peripheral IV 09/11/20 18 G Left Antecubital   09/11/20    1910    Antecubital   less than 1                   Vitals:    09/11/20 2230 09/11/20 2347 09/12/20 0050 09/12/20 0059   BP: 158/86 173/85  (!) 174/101   Pulse: 75 64  82   Resp: 17 18  18    Temp: 98.8 F (37.1 C) 98.8 F (37.1 C)  98.1 F (36.7 C)   TempSrc: Oral Oral     SpO2: 99% 97%     Weight:   59.9 kg (132 lb 0.9 oz)    Height:   1.702 m (5' 7.01")

## 2020-09-12 NOTE — UM Notes (Signed)
79 YEAR OLD MALE CAME TO [facility] ER WITH C/O SYNCOPE EPISODES WITH BRIEF LOC.     VS- BP- 150/68, P- 75, R- 18, T- 98.8 LOP 6-9/10    EKG-  SINUS RHYTHM   WITH  PREMATURE ATRIAL COMPLEXES   INFERIOR MYOCARDIAL INFARCTION   , AGE UNDETERMINED   ABNORMAL ECG   WHEN COMPARED WITH ECG OF   25-May-2018 20:43,   PREMATURE ATRIAL COMPLEXES   ARE NOW PRESENT   VENTRICULAR RATE HAS DECREASED   BY 55 BPM   INFERIOR MYOCARDIAL INFARCTION   IS NOW  PRESENT     LABS- H/H- 10/30, NA 135, K 3.2, CO2 18, TROP 0.07--0.04    RIB XRAY -  1.  No evidence of displaced rib fracture.    2.  Postsurgical and degenerative changes of the spine, incompletely  evaluated on this exam.    L SPINE-  1.  Postsurgical and degenerative changes of the lumbar spine, as  described above.    2.  Multiple superior endplate compression deformities at the  thoracolumbar junction, most pronounced at L1, which are of uncertain chronicity.    CT HEAD-  No acute traumatic intracranial abnormality is seen.    PMH- ETOH ABUSE, ANXIETY, BPH, FIBROMYALGIA, GERD, HIATAL HERNIA, PANCREATITIS, HLD, HTN, SKIN CA, PE, SPINAL STENOSIS, APPY, CERVICAL FISION, CHOLE, ERCP, LAMI, TONSILLECTOMY, VASECTOMY    PLAN- ADMIT TO OBSERVATION CLASS 09/11/2020 2236    #Syncopal episode--unknown etiology r/o cardiac vs neurological cause  -EKG with sinus rhythm   -HCT negative for any acute findings  -Xray, ribs, lumbar spine shows no acute findings of fracture. Known post surgical and degenerative changes of the spine noted  -F/up on cervical spine xray results.   -Troponin 0.07; continue to trend till peak  -Neuro checks q4h x1day  -orthostatic BP qshift  -Fall precautions  -Lidocaine patch to be applied to left rib for pain       #HTN   -On Amlodipine-benazepril 1 capsule (5-10mg  dose) daily   -Alternative  Lisinopril-amlodipine 10-2.5 dose (1tab daily dose) ordered           #HLD  -Diet controlled       #H/O Degenerative lumbar stenosis, spinal stenosis, spondylolisthesis,  fibromyalgia in remission  -on methadone 5mg  q8h continue       #Alcoholism   -c/w thiamine 100mg  and folic acid 1mg  daily   -Ciwa precautions in place.         Code Status: full code   Discussed with: Attending, patient, RN.    Adult Admit to Observation (Order #[id number]) on 09/11/20    Syncope: Observation Care - Observation Care Admission Criteria    UTILIZATION REVIEW CONTACT: Name: PAM Edmund Holcomb  Clinical Case Manager  - Utilization Review  [facility]  Address:  [address] [place], [place]  [address]  NPI:   [id number]  Tax ID:  [id number]  Phone: [phone]  Fax: [phone]    Please use fax number [phone] to provide authorization for hospital services or to request additional information.        NOTES TO REVIEWER:     This clinical review is based on/compiled from documentation provided by the treatment team within the patients medical record.

## 2020-09-14 ENCOUNTER — Ambulatory Visit (INDEPENDENT_AMBULATORY_CARE_PROVIDER_SITE_OTHER): Payer: Medicare Other | Admitting: Nurse Practitioner

## 2020-11-14 ENCOUNTER — Encounter (INDEPENDENT_AMBULATORY_CARE_PROVIDER_SITE_OTHER): Payer: Self-pay | Admitting: Family Medicine

## 2020-11-17 ENCOUNTER — Ambulatory Visit (INDEPENDENT_AMBULATORY_CARE_PROVIDER_SITE_OTHER): Payer: PRIVATE HEALTH INSURANCE | Admitting: Student in an Organized Health Care Education/Training Program

## 2020-11-17 NOTE — Progress Notes (Deleted)
Subjective:      Patient ID: Matthew [name] is a 79 y.o. male.    Chief Complaint:  No chief complaint on file.      HPI:  79 year old male with history of hypertension, hyperlipidemia, degenerative lumbar stenosis, chronic pain on methadone, history of bilateral PE, anemia, alcohol use daily, with recent admission for syncopal episodes on 09/11/2020 who presents today for concerns for memory loss.    Memory loss:  -Drinks ***alcohol daily  -MoCA score of ***  -Currently on chronic pain regiment methadone, morphine extended release, gabapentin 300 mg 3 times daily, Dilaudid 2 mg every 4 hours as needed for pain  -Ambien 5 mg daily  -Trazodone 50 mg nightly  -Vitamin B12 and folate within normal limits on 03/2018  -TSH within normal limits on 02/2018      Problem List:  Patient Active Problem List   Diagnosis    Degenerative lumbar spinal stenosis    Lumbar stenosis    Spondylolisthesis of lumbar region    Hypertension    BPH (benign prostatic hypertrophy)    History of lumbar laminectomy for spinal cord decompression    Pulmonary embolism, bilateral    Painful orthopaedic hardware    Alcohol withdrawal    Syncope and collapse       Current Medications:  Current Outpatient Medications   Medication Sig Dispense Refill    amlodipine-benazepril (LOTREL) 10-20 MG per capsule Take 1 capsule of your 5mg -10mg  dose capsule. This was prescribed by your primacy care provider.      atorvastatin (LIPITOR) 10 MG tablet Take 10 mg by mouth daily      cyclobenzaprine (FLEXERIL) 5 MG tablet Take 1 tablet (5 mg total) by mouth 3 (three) times daily as needed for Muscle spasms 35 tablet 0    docusate sodium (COLACE) 100 MG capsule Take 1 capsule (100 mg total) by mouth 2 (two) times daily as needed for Constipation 30 capsule 0    DULoxetine (CYMBALTA) 20 MG capsule Take 20 mg by mouth daily      folic acid (FOLVITE) 1 MG tablet Take 1 tablet (1 mg total) by mouth daily 30 tablet 0    gabapentin (NEURONTIN) 300 MG  capsule Take 300 mg by mouth 3 (three) times daily      HYDROmorphone (DILAUDID) 2 MG tablet Take 1 tablet (2 mg total) by mouth every 4 (four) hours as needed for Pain 30 tablet 0    Loperamide HCl (IMODIUM PO) Take by mouth      magnesium oxide (MAG-OX) 400 MG tablet Take 1 tablet (400 mg total) by mouth 2 (two) times daily 60 tablet 0    methadone (DOLOPHINE) 5 MG tablet Take 5 mg by mouth every 8 (eight) hours      Morphine Sulfate ER 15 MG Tablet Extended Release Abuse-Deterrent Take 15 mg by mouth daily 14 tablet 0    multivitamin (MULTIVITAMIN) Tab Take 1 tablet by mouth daily 30 tablet 0    naproxen sodium (ANAPROX) 220 MG tablet Take 220 mg by mouth 2 (two) times daily with meals      pramipexole (MIRAPEX) 0.5 MG tablet Take 0.5 mg by mouth nightly      thiamine (B-1) 100 MG tablet Take 1 tablet (100 mg total) by mouth daily 30 tablet 0    traZODone (DESYREL) 50 MG tablet Take 50 mg by mouth nightly       No current facility-administered medications for this visit.  Allergies:  No Known Allergies    Past Medical History:  Past Medical History:   Diagnosis Date    Alcohol withdrawal 05/25/2018    Anxiety 2000    Blood in stool     BPH (benign prostatic hypertrophy) 2007    w/ hx of obstruction - s/p suprapubic prostatectomy    Degenerative lumbar spinal stenosis 11/10/2015    Disorder of prostate 1999    Fibromyalgia     in remission    Gastroesophageal reflux disease     Hiatal hernia 2019    History of pancreatitis 2007    had microscopic stones - ERCP w/ stent done    Hyperlipidemia 1995    Hypertension     controlled    Malignant neoplasm of skin     removed from forehead - does not remember type of skin ca    Musculoskeletal disease     frequent backaches     Pancreatitis 2014    Pulmonary embolism, 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    Spinal stenosis     Spondylolisthesis     Syncopal episodes approx 2012    1x episode - had negative cardiac work-up - no episodes sinice       Past  Surgical History:  Past Surgical History:   Procedure Laterality Date    APPENDECTOMY (OPEN)  1952    BACK SURGERY      CERVICAL FUSION  1997    states has full ROM of neck    CHOLECYSTECTOMY  1980    ERCP, STENT  2007    LAMINECTOMY, POSTERIOR LUMBAR, DECOMP, FUSION, LEVEL 2 N/A 11/10/2015    Procedure: LAMINECTOMY, POSTERIOR LUMBAR, DECOMP, FUSION, LEVEL 2;  Surgeon: Clelia Schaumann, MD;  Location: [facility];  Service: Orthopedics;  Laterality: N/A;  L4-S1 LAMINECTOMY/FUSION    REMOVAL, POSTERIOR LUMBAR SPINE HARDWARE N/A 05/20/2018    Procedure: REMOVAL POSTERIOR LUMBAR SPINE HARDWARE L4-S1;  Surgeon: Clelia Schaumann, MD;  Location: [facility];  Service: Orthopedics;  Laterality: N/A;  REMOVAL OF HARDWARE LUMBAR SPINE    SUPRAPUBIC PROSTATECTOMY  2007    TONSILLECTOMY  as child    VASECTOMY  1980's       Family History:  Family History   Problem Relation Age of Onset    Cancer Mother     Ovarian cancer Mother     Cancer Father     Prostate cancer Father     Bladder Cancer Father     COPD Sister        Social History:  Social History     Socioeconomic History    Marital status: Divorced     Spouse name: Not on file    Number of children: Not on file    Years of education: Not on file    Highest education level: Not on file   Occupational History    Not on file   Tobacco Use    Smoking status: Never Smoker    Smokeless tobacco: Never Used   Vaping Use    Vaping Use: Never used   Substance and Sexual Activity    Alcohol use: Yes     Alcohol/week: 3.0 standard drinks     Types: 3 Glasses of wine per week     Comment: socially    Drug use: No    Sexual activity: Not on file   Other Topics Concern    Not on file   Social History Narrative    Not on file  Social Determinants of Health     Financial Resource Strain:     Difficulty of Paying Living Expenses: Not on file   Food Insecurity:     Worried About Programme researcher, broadcasting/film/video in the Last Year: Not on file    [employer] of Food  in the Last Year: Not on file   Transportation Needs:     Lack of Transportation (Medical): Not on file    Lack of Transportation (Non-Medical): Not on file   Physical Activity:     Days of Exercise per Week: Not on file    Minutes of Exercise per Session: Not on file   Stress:     Feeling of Stress : Not on file   Social Connections:     Frequency of Communication with Friends and Family: Not on file    Frequency of Social Gatherings with Friends and Family: Not on file    Attends Religious Services: Not on file    Active Member of Clubs or Organizations: Not on file    Attends Banker Meetings: Not on file    Marital Status: Not on file   Intimate Partner Violence:     Fear of Current or Ex-Partner: Not on file    Emotionally Abused: Not on file    Physically Abused: Not on file    Sexually Abused: Not on file   Housing Stability:     Unable to Pay for Housing in the Last Year: Not on file    Number of Places Lived in the Last Year: Not on file    Unstable Housing in the Last Year: Not on file       The following sections were reviewed this encounter by the provider:       ROS:  Review of Systems    Vitals:  There were no vitals taken for this visit.     Objective:     Physical Exam:  Physical Exam     Assessment:     There are no diagnoses linked to this encounter.    Plan:     ***    Alanson Puls, MD

## 2021-04-27 ENCOUNTER — Telehealth (INDEPENDENT_AMBULATORY_CARE_PROVIDER_SITE_OTHER): Payer: Self-pay | Admitting: Family Medicine

## 2021-04-27 NOTE — Telephone Encounter (Signed)
Patient would like to be worked in

## 2021-05-03 ENCOUNTER — Encounter (INDEPENDENT_AMBULATORY_CARE_PROVIDER_SITE_OTHER): Payer: Self-pay

## 2021-05-03 ENCOUNTER — Ambulatory Visit (INDEPENDENT_AMBULATORY_CARE_PROVIDER_SITE_OTHER): Payer: Medicare Other | Admitting: Family Medicine

## 2021-05-03 VITALS — BP 108/60 | HR 80 | Temp 97.5°F | Resp 12 | Ht 68.0 in | Wt 126.8 lb

## 2021-05-03 DIAGNOSIS — S060X0A Concussion without loss of consciousness, initial encounter: Secondary | ICD-10-CM

## 2021-05-03 DIAGNOSIS — Z4802 Encounter for removal of sutures: Secondary | ICD-10-CM

## 2021-05-03 DIAGNOSIS — S0181XA Laceration without foreign body of other part of head, initial encounter: Secondary | ICD-10-CM

## 2021-05-03 NOTE — Progress Notes (Signed)
[facility]  [facility] [place] [facility]               Date of Exam: 05/03/2021 11:46 AM      Patient ID: Matthew [name] is a 80 y.o. male.  Attending Physician: PP Cory Roughen IN MD      Chief Complaint:   As per HPI.  Otherwise -   Chief Complaint   Patient presents with   . Suture/Staple Removal               HPI:   HPI    Suture removal  - L forehead  - patient was in [place] and slipped on lobby and fell face first  - occurred 10 days ago  - had stitches placed at local ED. Unsure how many stitches. Dx with concussion  - admits to lightheadedness when standing, chronic  - admits to nausea, no vomiting  - mood okay  - reports he felt fogginess initially but that has cleared  - no sleep disturbances  - denies HA    - no hx of concussion, chronic headaches, learning disorders, depression/anxiety       ROS:   As per HPI.  Otherwise as below.  Review of Systems           Problem List:   Patient Active Problem List   Diagnosis   . Degenerative lumbar spinal stenosis   . Lumbar stenosis   . Spondylolisthesis of lumbar region   . Hypertension   . BPH (benign prostatic hypertrophy)   . History of lumbar laminectomy for spinal cord decompression   . Pulmonary embolism, bilateral   . Painful orthopaedic hardware   . Alcohol withdrawal   . Syncope and collapse           Current Meds:   Outpatient Medications Marked as Taking for the 05/03/21 encounter (Office Visit) with PP Cory Roughen IN MD   Medication Sig Dispense Refill   . atorvastatin (LIPITOR) 10 MG tablet Take 10 mg by mouth daily     . cyclobenzaprine (FLEXERIL) 5 MG tablet Take 1 tablet (5 mg total) by mouth 3 (three) times daily as needed for Muscle spasms 35 tablet 0   . folic acid (FOLVITE) 1 MG tablet Take 1 tablet (1 mg total) by mouth daily 30 tablet 0   . gabapentin (NEURONTIN) 300 MG capsule Take 300 mg by mouth 3 (three) times daily     . HYDROmorphone (DILAUDID) 2 MG tablet Take 1 tablet (2 mg  total) by mouth every 4 (four) hours as needed for Pain 30 tablet 0   . Loperamide HCl (IMODIUM PO) Take by mouth     . Morphine Sulfate ER 15 MG Tablet Extended Release Abuse-Deterrent Take 15 mg by mouth daily 14 tablet 0   . multivitamin (MULTIVITAMIN) Tab Take 1 tablet by mouth daily 30 tablet 0   . naproxen sodium (ANAPROX) 220 MG tablet Take 220 mg by mouth 2 (two) times daily with meals     . ondansetron (ZOFRAN-ODT) 4 MG disintegrating tablet TAKE 1 TABLET BY MOUTH EVERY FOUR TO SIX HOURS AS NEEDED NAUSEA     . pantoprazole (PROTONIX) 40 MG tablet      . pramipexole (MIRAPEX) 0.5 MG tablet Take 0.5 mg by mouth nightly     . promethazine (PHENERGAN) 25 MG tablet TAKE 1 TABLET BY MOUTH EVERY 12 HOURS FOR 10 DAYS AS NEEDED     .  tadalafil (CIALIS) 20 MG tablet Take 20 mg by mouth daily as needed     . thiamine (B-1) 100 MG tablet Take 1 tablet (100 mg total) by mouth daily 30 tablet 0   . traZODone (DESYREL) 50 MG tablet Take 50 mg by mouth nightly           Allergies:   No Known Allergies     Past Surgical History:   Past Surgical History:   Procedure Laterality Date   . APPENDECTOMY (OPEN)  1952   . BACK SURGERY     . CERVICAL FUSION  1997    states has full ROM of neck   . CHOLECYSTECTOMY  1980   . ERCP, STENT  2007   . LAMINECTOMY, POSTERIOR LUMBAR, DECOMP, FUSION, LEVEL 2 N/A 11/10/2015    Procedure: LAMINECTOMY, POSTERIOR LUMBAR, DECOMP, FUSION, LEVEL 2;  Surgeon: Clelia Schaumann, MD;  Location: [facility];  Service: Orthopedics;  Laterality: N/A;  L4-S1 LAMINECTOMY/FUSION   . REMOVAL, POSTERIOR LUMBAR SPINE HARDWARE N/A 05/20/2018    Procedure: REMOVAL POSTERIOR LUMBAR SPINE HARDWARE L4-S1;  Surgeon: Clelia Schaumann, MD;  Location: [facility];  Service: Orthopedics;  Laterality: N/A;  REMOVAL OF HARDWARE LUMBAR SPINE   . SUPRAPUBIC PROSTATECTOMY  2007   . TONSILLECTOMY  as child   . VASECTOMY  1980's          Family History:   Family History   Problem Relation Age of Onset   . Cancer Mother     . Ovarian cancer Mother    . Cancer Father    . Prostate cancer Father    . Bladder Cancer Father    . COPD Sister           Social History:   Social History     Tobacco Use   . Smoking status: Never Smoker   . Smokeless tobacco: Never Used   Vaping Use   . Vaping Use: Never used   Substance Use Topics   . Alcohol use: Yes     Alcohol/week: 3.0 standard drinks     Types: 3 Glasses of wine per week     Comment: socially   . Drug use: No          The following sections were reviewed this encounter by the provider:               Vital Signs:   BP 108/60   Pulse 80   Temp 97.5 F (36.4 C)   Resp 12   Ht 1.727 m (5\' 8" )   Wt 57.5 kg (126 lb 12.8 oz)   BMI 19.28 kg/m           Physical Exam:   Physical Exam     GEN: NAD, well nourished  HEAD: AT/NC  NEURO: AAOx3, moving all extremities  SKIN: 5 sutures located on L forehead with overlying scabbing, no erythema or discharge         Procedure(s):   As per Procedure Note.  Otherwise as below.  Suture Removal    Date/Time: 05/03/2021 12:24 PM  Performed by: Shyah Cadmus, [employer], DO  Authorized by: Latrenda Irani K, DO   Consent: Verbal consent obtained.  Risks and benefits: risks, benefits and alternatives were discussed  Consent given by: patient  Patient understanding: patient states understanding of the procedure being performed  Patient identity confirmed: verbally with patient  Body area: head/neck  Location details: forehead  Wound Appearance: clean (overlying scabbing)  Sutures Removed: 5  Sutures placed in this facility: sutures placed at ED in [place].  Patient tolerance: patient tolerated the procedure well with no immediate complications                Assessment:   1. Encounter for removal of sutures    2. Laceration of forehead, initial encounter    3. Concussion without loss of consciousness, initial encounter           Plan:   - sutures removed  - keep area clean and dry  - discussed return precautions including fevers, discharge, bleeding, and/or  worsening of symptoms  - concussion symptoms improving, relatively resolved. If no improvement in symptoms in 1 week, recommend follow up with concussion clinic           Follow-up:   No follow-ups on file.          PP [facility] MD

## 2023-08-07 ENCOUNTER — Other Ambulatory Visit: Payer: Self-pay | Admitting: Student in an Organized Health Care Education/Training Program

## 2023-08-07 DIAGNOSIS — M48061 Spinal stenosis, lumbar region without neurogenic claudication: Secondary | ICD-10-CM

## 2023-09-10 DEATH — deceased
